# Patient Record
Sex: Female | Born: 1970 | Smoking: Former smoker
Health system: Southern US, Community
[De-identification: ages and names within clinical notes are randomized; demographics above are authoritative.]

## PROBLEM LIST (undated history)

## (undated) DIAGNOSIS — E039 Hypothyroidism, unspecified: Secondary | ICD-10-CM

## (undated) DIAGNOSIS — R5381 Other malaise: Secondary | ICD-10-CM

## (undated) DIAGNOSIS — E785 Hyperlipidemia, unspecified: Secondary | ICD-10-CM

## (undated) DIAGNOSIS — R5383 Other fatigue: Secondary | ICD-10-CM

## (undated) DIAGNOSIS — F3189 Other bipolar disorder: Secondary | ICD-10-CM

## (undated) HISTORY — DX: Hyperlipidemia, unspecified: E78.5

## (undated) HISTORY — DX: Other malaise: R53.83

## (undated) HISTORY — DX: Other bipolar disorder: F31.89

## (undated) HISTORY — DX: Other malaise: R53.81

## (undated) HISTORY — DX: Other fatigue: R53.83

## (undated) HISTORY — PX: OTHER SURGICAL HISTORY: SHX169

## (undated) HISTORY — DX: Hypothyroidism, unspecified: E03.9

---

## 2006-07-28 ENCOUNTER — Emergency Department (HOSPITAL_COMMUNITY): Admission: EM | Admit: 2006-07-28 | Discharge: 2006-07-28 | Payer: Self-pay | Admitting: Emergency Medicine

## 2006-12-26 ENCOUNTER — Inpatient Hospital Stay (HOSPITAL_COMMUNITY): Admission: AD | Admit: 2006-12-26 | Discharge: 2007-01-14 | Payer: Self-pay | Admitting: Psychiatry

## 2006-12-26 ENCOUNTER — Emergency Department (HOSPITAL_COMMUNITY): Admission: EM | Admit: 2006-12-26 | Discharge: 2006-12-26 | Payer: Self-pay | Admitting: Emergency Medicine

## 2006-12-26 ENCOUNTER — Ambulatory Visit: Payer: Self-pay | Admitting: Psychiatry

## 2007-01-09 ENCOUNTER — Telehealth: Payer: Self-pay | Admitting: Family Medicine

## 2007-01-16 ENCOUNTER — Inpatient Hospital Stay (HOSPITAL_COMMUNITY): Admission: RE | Admit: 2007-01-16 | Discharge: 2007-01-24 | Payer: Self-pay | Admitting: Psychiatry

## 2007-01-16 ENCOUNTER — Ambulatory Visit: Payer: Self-pay | Admitting: Psychiatry

## 2007-01-25 ENCOUNTER — Other Ambulatory Visit (HOSPITAL_COMMUNITY): Admission: RE | Admit: 2007-01-25 | Discharge: 2007-04-25 | Payer: Self-pay | Admitting: Psychiatry

## 2007-01-25 ENCOUNTER — Ambulatory Visit: Payer: Self-pay | Admitting: Family Medicine

## 2007-01-25 DIAGNOSIS — E039 Hypothyroidism, unspecified: Secondary | ICD-10-CM | POA: Insufficient documentation

## 2007-01-25 DIAGNOSIS — R5383 Other fatigue: Secondary | ICD-10-CM

## 2007-01-25 DIAGNOSIS — R5381 Other malaise: Secondary | ICD-10-CM | POA: Insufficient documentation

## 2007-01-25 DIAGNOSIS — F312 Bipolar disorder, current episode manic severe with psychotic features: Secondary | ICD-10-CM | POA: Insufficient documentation

## 2007-01-31 ENCOUNTER — Ambulatory Visit: Payer: Self-pay | Admitting: Family Medicine

## 2007-02-01 LAB — CONVERTED CEMR LAB
ALT: 9 units/L (ref 0–35)
AST: 13 units/L (ref 0–37)
Albumin: 2.9 g/dL — ABNORMAL LOW (ref 3.5–5.2)
Alkaline Phosphatase: 44 units/L (ref 39–117)
BUN: 13 mg/dL (ref 6–23)
Basophils Absolute: 0 10*3/uL (ref 0.0–0.1)
Basophils Relative: 0.5 % (ref 0.0–1.0)
Bilirubin, Direct: 0.1 mg/dL (ref 0.0–0.3)
CO2: 31 meq/L (ref 19–32)
Calcium: 9.2 mg/dL (ref 8.4–10.5)
Chloride: 104 meq/L (ref 96–112)
Cholesterol: 186 mg/dL (ref 0–200)
Creatinine, Ser: 0.8 mg/dL (ref 0.4–1.2)
Eosinophils Absolute: 0.2 10*3/uL (ref 0.0–0.6)
Eosinophils Relative: 2 % (ref 0.0–5.0)
Free T4: 0.5 ng/dL — ABNORMAL LOW (ref 0.6–1.6)
GFR calc Af Amer: 106 mL/min
GFR calc non Af Amer: 87 mL/min
Glucose, Bld: 86 mg/dL (ref 70–99)
HCT: 35.2 % — ABNORMAL LOW (ref 36.0–46.0)
HDL: 48.8 mg/dL (ref >39.0)
Hemoglobin: 11.9 g/dL — ABNORMAL LOW (ref 12.0–15.0)
LDL Cholesterol: 119 mg/dL — ABNORMAL HIGH (ref 0–99)
Lymphocytes Relative: 24 % (ref 12.0–46.0)
MCHC: 33.9 g/dL (ref 30.0–36.0)
MCV: 89.7 fL (ref 78.0–100.0)
Monocytes Absolute: 0.9 10*3/uL — ABNORMAL HIGH (ref 0.2–0.7)
Monocytes Relative: 10.6 % (ref 3.0–11.0)
Neutro Abs: 5.3 10*3/uL (ref 1.4–7.7)
Neutrophils Relative %: 62.9 % (ref 43.0–77.0)
Platelets: 207 10*3/uL (ref 150–400)
Potassium: 4.9 meq/L (ref 3.5–5.1)
RBC: 3.92 M/uL (ref 3.87–5.11)
RDW: 12.4 % (ref 11.5–14.6)
Sodium: 140 meq/L (ref 135–145)
T3, Free: 2.3 pg/mL (ref 2.3–4.2)
TSH: 16.6 microintl units/mL — ABNORMAL HIGH (ref 0.35–5.50)
Total Bilirubin: 0.6 mg/dL (ref 0.3–1.2)
Total CHOL/HDL Ratio: 3.8
Total Protein: 5.6 g/dL — ABNORMAL LOW (ref 6.0–8.3)
Triglycerides: 89 mg/dL (ref 0–149)
VLDL: 18 mg/dL (ref 0–40)
WBC: 8.4 10*3/uL (ref 4.5–10.5)

## 2007-02-22 ENCOUNTER — Ambulatory Visit: Payer: Self-pay | Admitting: Psychiatry

## 2007-03-17 ENCOUNTER — Encounter (INDEPENDENT_AMBULATORY_CARE_PROVIDER_SITE_OTHER): Payer: Self-pay | Admitting: *Deleted

## 2007-03-20 ENCOUNTER — Ambulatory Visit: Payer: Self-pay | Admitting: Family Medicine

## 2007-03-21 LAB — CONVERTED CEMR LAB: TSH: 3.99 microintl units/mL (ref 0.35–5.50)

## 2008-02-08 ENCOUNTER — Other Ambulatory Visit: Admission: RE | Admit: 2008-02-08 | Discharge: 2008-02-08 | Payer: Self-pay | Admitting: Family Medicine

## 2008-02-08 ENCOUNTER — Encounter: Payer: Self-pay | Admitting: Family Medicine

## 2008-02-08 ENCOUNTER — Ambulatory Visit: Payer: Self-pay | Admitting: Family Medicine

## 2008-02-12 LAB — CONVERTED CEMR LAB
Pap Smear: NORMAL
TSH: 8.3 microintl units/mL — ABNORMAL HIGH (ref 0.35–5.50)

## 2008-02-14 ENCOUNTER — Encounter (INDEPENDENT_AMBULATORY_CARE_PROVIDER_SITE_OTHER): Payer: Self-pay | Admitting: *Deleted

## 2008-03-05 ENCOUNTER — Encounter: Payer: Self-pay | Admitting: Family Medicine

## 2009-05-17 ENCOUNTER — Emergency Department (HOSPITAL_COMMUNITY): Admission: EM | Admit: 2009-05-17 | Discharge: 2009-05-20 | Payer: Self-pay | Admitting: Emergency Medicine

## 2009-05-20 ENCOUNTER — Inpatient Hospital Stay (HOSPITAL_COMMUNITY): Admission: AD | Admit: 2009-05-20 | Discharge: 2009-06-09 | Payer: Self-pay | Admitting: Psychiatry

## 2009-05-20 ENCOUNTER — Ambulatory Visit: Payer: Self-pay | Admitting: Psychiatry

## 2009-06-27 ENCOUNTER — Ambulatory Visit: Payer: Self-pay | Admitting: Family Medicine

## 2009-07-01 LAB — CONVERTED CEMR LAB: TSH: 2.725 microintl units/mL (ref 0.350–4.500)

## 2010-01-28 ENCOUNTER — Encounter (INDEPENDENT_AMBULATORY_CARE_PROVIDER_SITE_OTHER): Payer: Self-pay | Admitting: *Deleted

## 2010-02-03 ENCOUNTER — Ambulatory Visit: Payer: Self-pay | Admitting: Family Medicine

## 2010-02-03 ENCOUNTER — Other Ambulatory Visit: Admission: RE | Admit: 2010-02-03 | Discharge: 2010-02-03 | Payer: Self-pay | Admitting: Family Medicine

## 2010-02-09 ENCOUNTER — Encounter (INDEPENDENT_AMBULATORY_CARE_PROVIDER_SITE_OTHER): Payer: Self-pay | Admitting: *Deleted

## 2010-03-02 ENCOUNTER — Ambulatory Visit: Payer: Self-pay | Admitting: Family Medicine

## 2010-03-04 LAB — CONVERTED CEMR LAB
ALT: 13 units/L (ref 0–35)
AST: 14 units/L (ref 0–37)
Alkaline Phosphatase: 50 units/L (ref 39–117)
Bilirubin, Direct: 0.1 mg/dL (ref 0.0–0.3)
CO2: 24 meq/L (ref 19–32)
Chloride: 102 meq/L (ref 96–112)
Potassium: 4.2 meq/L (ref 3.5–5.1)
Sodium: 139 meq/L (ref 135–145)
Total Bilirubin: 0.8 mg/dL (ref 0.3–1.2)
Total CHOL/HDL Ratio: 4
Total Protein: 6.9 g/dL (ref 6.0–8.3)
VLDL: 11.8 mg/dL (ref 0.0–40.0)

## 2010-05-06 ENCOUNTER — Ambulatory Visit
Admission: RE | Admit: 2010-05-06 | Discharge: 2010-05-06 | Payer: Self-pay | Source: Home / Self Care | Attending: Family Medicine | Admitting: Family Medicine

## 2010-06-02 NOTE — Letter (Signed)
Summary: [place] No Show Letter  [place] at [facility]  [address] [place], [place] [address]   Phone: [phone]  Fax: [phone]    01/28/2010 MRN: [id number]  [name] [address] [place], [place]  [address]   Dear [name],   Our records indicate that you missed your scheduled appointment with _____lab________________ on __9.27.11__________.  Please contact this office to reschedule your appointment as soon as possible.  It is important that you keep your scheduled appointments with your physician, so we can provide you the best care possible.  Please be advised that there may be a charge for "no show" appointments.    Sincerely,   [place] at [facility]

## 2010-06-02 NOTE — Letter (Signed)
Summary: Results Follow up Letter  [place] at [facility]  [address] [place], [place] [address]   Phone: [phone]  Fax: [phone]    02/09/2010 MRN: [id number]     [name] [address] [place], [place]  [address]    Dear [name],  The following are the results of your recent test(s):  Test         Result    Pap Smear:        Normal __x___  Not Normal _____ Comments:Repeat in 1 year ______________________________________________________ Cholesterol: LDL(Bad cholesterol):         Your goal is less than:         HDL (Good cholesterol):       Your goal is more than: Comments:  ______________________________________________________ Mammogram:        Normal _____  Not Normal _____ Comments:  ___________________________________________________________________ Hemoccult:        Normal _____  Not normal _______ Comments:    _____________________________________________________________________ Other Tests:    We routinely do not discuss normal results over the telephone.  If you desire a copy of the results, or you have any questions about this information we can discuss them at your next office visit.   Sincerely,  Kerby Nora MD
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
## 2010-06-04 NOTE — Assessment & Plan Note (Signed)
Summary: DEPO SHOT/CLE  Nurse Visit   Allergies: No Known Drug Allergies  Medication Administration  Injection # 1:    Medication: Depo-Provera 150mg     Diagnosis: CONTRACEPTIVE MANAGEMENT (ICD-V25.09)    Route: IM    Site: RUOQ gluteus    Exp Date: 08/31/2012    Lot #: [id number]    Mfr: GREENSTONE    Patient tolerated injection without complications    Given by: Mervin Hack CMA (AAMA) (May 06, 2010 4:18 PM)  Orders Added: 1)  Depo-Provera 150mg  [J1055] 2)  Admin of Therapeutic Inj  intramuscular or subcutaneous [96372]  Prior Medications: MULTIVITAMINS   TABS (MULTIPLE VITAMIN) Take 1 tablet by mouth once a day DEPAKOTE 125 MG TBEC (DIVALPROEX SODIUM) takes once daily Current Allergies: No known allergies    Medication Administration  Injection # 1:    Medication: Depo-Provera 150mg     Diagnosis: CONTRACEPTIVE MANAGEMENT (ICD-V25.09)    Route: IM    Site: RUOQ gluteus    Exp Date: 08/31/2012    Lot #: [id number]    Mfr: GREENSTONE    Patient tolerated injection without complications    Given by: Mervin Hack CMA (AAMA) (May 06, 2010 4:18 PM)  Orders Added: 1)  Depo-Provera 150mg  [J1055] 2)  Admin of Therapeutic Inj  intramuscular or subcutaneous [78295]

## 2010-07-19 LAB — DIFFERENTIAL
Basophils Relative: 0 % (ref 0–1)
Eosinophils Absolute: 0 10*3/uL (ref 0.0–0.7)
Lymphs Abs: 1.7 10*3/uL (ref 0.7–4.0)
Neutro Abs: 6 10*3/uL (ref 1.7–7.7)
Neutrophils Relative %: 74 % (ref 43–77)

## 2010-07-19 LAB — GLUCOSE, CAPILLARY: Glucose-Capillary: 117 mg/dL — ABNORMAL HIGH (ref 70–99)

## 2010-07-19 LAB — BASIC METABOLIC PANEL
BUN: 14 mg/dL (ref 6–23)
Calcium: 9.5 mg/dL (ref 8.4–10.5)
Chloride: 106 mEq/L (ref 96–112)
Creatinine, Ser: 0.83 mg/dL (ref 0.4–1.2)

## 2010-07-19 LAB — RAPID URINE DRUG SCREEN, HOSP PERFORMED
Amphetamines: NOT DETECTED
Benzodiazepines: NOT DETECTED
Cocaine: NOT DETECTED
Tetrahydrocannabinol: NOT DETECTED

## 2010-07-19 LAB — CBC
MCHC: 33.7 g/dL (ref 30.0–36.0)
MCV: 88.3 fL (ref 78.0–100.0)
Platelets: 326 10*3/uL (ref 150–400)
WBC: 8.1 10*3/uL (ref 4.0–10.5)

## 2010-07-19 LAB — ETHANOL: Alcohol, Ethyl (B): <5 mg/dL (ref 0–10)

## 2010-07-19 LAB — POCT PREGNANCY, URINE: Preg Test, Ur: NEGATIVE

## 2010-07-20 LAB — HEPATIC FUNCTION PANEL
ALT: 13 U/L (ref 0–35)
AST: 15 U/L (ref 0–37)
Bilirubin, Direct: 0.2 mg/dL (ref 0.0–0.3)
Total Bilirubin: 1 mg/dL (ref 0.3–1.2)

## 2010-08-05 ENCOUNTER — Ambulatory Visit (INDEPENDENT_AMBULATORY_CARE_PROVIDER_SITE_OTHER): Payer: BC Managed Care – PPO | Admitting: Family Medicine

## 2010-08-05 DIAGNOSIS — Z3009 Encounter for other general counseling and advice on contraception: Secondary | ICD-10-CM

## 2010-08-05 MED ORDER — MEDROXYPROGESTERONE ACETATE 150 MG/ML IM SUSP
150.0000 mg | Freq: Once | INTRAMUSCULAR | Status: AC
  Administered 2010-08-05: 150 mg via INTRAMUSCULAR

## 2010-08-05 NOTE — Progress Notes (Signed)
  Subjective:    Patient ID: [name], female    DOB: 22-Mar-1971, 40 y.o.   MRN: [id number]  HPI Agree with depo.   Review of Systems     Objective:   Physical Exam        Assessment & Plan:

## 2010-09-15 NOTE — H&P (Signed)
[name], [name]              ACCOUNT NO.:  [id number]   MEDICAL RECORD NO.:  [id number]          PATIENT TYPE:  IPS   LOCATION:  0304                          FACILITY:  [facility]   PHYSICIAN:  Anselm Jungling, MD  DATE OF BIRTH:  1970-06-13   DATE OF ADMISSION:  12/26/2006  DATE OF DISCHARGE:                       PSYCHIATRIC ADMISSION ASSESSMENT   TIME:  0800 hours.   IDENTIFYING INFORMATION:  This a 40 year old married white female.  This  is a voluntary admission.   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  She was  given 10 mg Geodon IM and 1 mg of Ativan p.o. on arrival here.  She  continued with pressured speech, flight of ideas, some increased motor  movement, feeling the need to pace and walk.  Mood is irritable and  grandiose, and thoughts are hyper-religious.  She is stating that she is  God, no one should come near her or attempt to direct her.  She is  preoccupied with dividing people up into who are the believers versus  the nonbelievers, who is blessed versus who is non-blessed.  Her  cognition is preserved.  She is aware of person, place and situation,  but is a poor historian due to her flight of ideas.  Primarily, her  history is taken from the record.  Her husband had said she has had  episodes like this previous in the past prior to the start of the school  year.  No overt homicidal or suicidal thoughts expressed.  She does  appear to be internally-stimulated and distracted.   PAST PSYCHIATRIC HISTORY:  This is her first admission to [name].   PAST PSYCHIATRIC HISTORY:  She has a prior diagnosis of bipolar disorder  diagnosed in [place], where  she has been previously hospitalized.  She reports that she had taken Depakote in the past for her bipolar  disorder, but did not continue to take it.  She is unable to give much  more in terms of medication history.  She reports she is not currently  taking any medications and has no current psychiatrist.  Denies any  history of substance abuse.  Denies prior suicide attempts.   SOCIAL HISTORY:  A 2nd grade teacher, [name]  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   FAMILY HISTORY:  Not available.   ALCOHOL AND DRUG HISTORY:  She denies any substance abuse.   MEDICAL HISTORY:  The patient is followed by [name].  Physician assignment is unavailable.   MEDICAL PROBLEMS:  The patient has reported a history of thyroid  disorder in the past and bipolar disorder.   CURRENT MEDICATIONS:  None.   PAST MEDICAL HISTORY:  Remarkable for an having episode of nausea and  vomiting in March for which she was seen in our emergency room.  No  other information available.   She is  ALLERGIC TO PENICILLIN.   MEDICATIONS RECEIVED IN THE EMERGENCY ROOM:  Geodon 10 mg IM and Ativan  1 mg p.o.  Her local pharmacy is [facility] in [place], [place] where  they report she is on no regular medications.   POSITIVE PHYSICAL FINDINGS:  Full physical exam was done in the  emergency room, generally unremarkable.  Petite, medium-built female in  no distress.  She does have a noticeable slight goiter.  On  presentation, temp 99.1, pulse 108, respirations 21, blood pressure  122/70, pulse ox 98%.  Her CT scan of her brain was unremarkable.  Urine  drug screen negative for all substances.  TSH 1.370.  Urine pregnancy  test negative.  Chemistry:  sodium 137, potassium 3.7, chloride 112,  carbon dioxide 16, BUN 16, creatinine 0.6, and random glucose was 108.   MENTAL STATUS EXAM:  A petite female, agitated with pressured speech,  wandering in the hall, occasionally screaming, singing religious hymns  from time-to-time.  Speech is pressured.  She is  directable with effort.  No eye contact.  Affect is labile, tearful at times, elevated at others.  Speech is variable in tone, minimal production.  Mood is labile.  Thought process remarkable for flight of ideas.  She does appear  internally distracted.  A lot of hyper-religious thought and theme.  Cognition, she is able to express that she is fully aware of where she  is at and why, and hospital she has been in in the past.  Judgment is  impaired. Concentration is impaired.  Insight is impaired.  Impulse  control impaired.   AXIS I:  Bipolar disorder type 1, mixed state.  AXIS II:  Deferred.  AXIS III:  No diagnosis.  AXIS IV:  Not known.  AXIS V:  Current 20, past year not known.   PLAN:  Voluntarily admit the patient with q. 15-minute checks in place  to stabilize her and alleviate her mood lability and flight of ideas,  improve her reality testing.  We are going to resume her Depakote with  250 mg q.a.m. and 500 at h.s.  We are going to actually file an  involuntary petition on her at this time.  She has been refusing to take  medications, is somewhat disruptive.  Zyprexa 10 mg p.o. Zydis.  She  received it 4 a.m. this morning, and has 5 mg available q.6 hour p.r.n.  and Ativan 1 mg q.4 hours p.r.n.  We are going to contact her husband  for additional information, and we will go from there.  Estimated length  of stay is 7 days.      Margaret A. Lorin Picket, N.P.      Anselm Jungling, MD  Electronically Signed    MAS/MEDQ  D:  12/27/2006  T:  12/27/2006  Job:  [id number]

## 2010-09-15 NOTE — Discharge Summary (Signed)
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  Please refer to the admission note for further details  pertaining to the symptoms, circumstances and history that led to her  hospitalization.   INITIAL DIAGNOSTIC IMPRESSION:  She was given an initial AXIS I  diagnosis of bipolar disorder, type 1, manic with psychotic features.   MEDICAL/LABORATORY:  The patient was medically and physically assessed  by the psychiatric nurse practitioner.  She was in essentially good  health, although we obtained records from an inpatient hospitalization  of 1998 that occurred in [place].  In that psychiatric  hospitalization, there was some concern about thyroid abnormality.  The  patient's thyroid indices were checked here during this hospital stay,  and were within normal limits.  There were no significant medical issues  apart from her psychiatric ones.   HOSPITAL COURSE:  The patient was admitted to the adult inpatient  psychiatric service.  She presented as a well-nourished, well-developed  woman who appeared to be in good health.  Initially, she was extremely  agitated, hyperactive, yelling, singing, crying, and at times  inconsolable.  She refused multiple offers of oral medication.  She  stated that she believed medication that was being offered was poison.  There was a lot of hyperreligious ideation within her delusional  thinking system.   She was initially  treated with a combination of Geodon and Depakote.  The patient consistently refused doses of medications during the early  part of her stay, although at times, when her husband was present, he  was able to persuade her to take oral medication.  After a period of  days, in which the patient appeared to be making virtually no clinical  progress due to her repeated refusal of medication doses, it was  determined that it was in her best interest to give involuntary  medication in the form of injectable Geodon doses.  This was discussed  with her husband in detail, and he was in agreement with this approach.  A second opinion was obtained by one of our other staff psychiatrists,  who was in agreement that the patient met criteria for nonconsenting  medication administration.  The patient did receive some doses of her  Geodon intramuscularly, but following this, was more frequently willing  to take oral doses of Geodon and Depakote.   Towards the end of the first week of her inpatient stay, the undersigned  and the casemanager met with the patient's husband for a 45-minute long  conference to discuss her diagnosis and treatment approaches.  We  characterized to her husband that this appeared to be a clear episode of  manic psychosis, part of a bipolar disorder that appeared to be a  longstanding part of her history.  Apparently, in the time that the  patient had been married to this husband, she had not been significantly  ill.  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  We  emphasized to her husband the importance of stabilizing her condition  with medication, and that owing to her repeated refusal of oral  medication, that we would need to apply nonconsenting injectable  medication if oral doses were refused.  We reassured him that we would  only to take this step if the patient was refusing medication  after  significant efforts to persuade her to take the oral medication.  Husband indicated that he understood this approach.   The patient's husband indicated that there was some concern that her  current condition could have been due to either thyroid abnormality,  some form of adrenal insufficiency, or simply lack of sleep.  It was  explained to the patient's husband that lack of sleep, or decreased need  for sleep is a hallmark symptom of a manic episode, and that although  insomnia may have preceded the more florid expression of her illness,  that it alone was not the cause of it, but rather the first form of  symptoms to become overtly noticeable.  We indicated to her husband that  her thyroid indices as we tested them were normal, and that she did not  appear to have any physical or physiological stigmata of thyroid  disturbance.  Based on his concern about adrenal insufficiency, which  did not appear to be based on anything other than some exploration that  he had done on the internet, we went ahead and ordered a serum cortisol  level, which was actually at about the 80th percentile of the normal  range for serum cortisol.   We reached a point during the middle part of her stay where it appeared  that she was not having a good response to the combination of Geodon and  Depakote, even after she began to have these medications administered on  a more regular basis.  As such, it was felt necessary to switch to a  trial of Risperdal, and to add lithium to her regimen.  Within a few  days of adding lithium and substituting Risperdal for Geodon, the  patient began to show some significant improvement, with development of  more insight into her condition, less in the way of racing thoughts and  loose associations, and more ability to participate in some of the  activities of the therapeutic milieu.  Her husband agreed that she was  showing signs of improvement.   For the remainder of  her stay, she was continued on a combination of  Risperdal, the dosage of which was gradually adjusted upward to a final  level of 4 mg q.h.s., lithium, and Depakote.   Relatively early on in the patient's stay, before we felt that she was  showing much in the way of significant improvement, her husband  expressed an interest and a desire to take the patient home, and have  himself and his mother care for the patient there and administer her  medications.  We indicated to husband that owing to his availability and  his mother's availability, virtually  24 hours a day, that we would  consider releasing her a bit earlier than we might have otherwise.   Towards the end of her stay, Beatriz was beginning to show a fair degree  of insight into her illness, and the need for medication.  However, she  also verbalized a hope that she would be able to find a homeopathic  physician that would wean her off these medications and find something  more natural to treat her disorder.  Both she and her husband were  cautioned against this sort of approach.   On the 20th hospital day, the patient appeared to be improved  sufficiently, and cooperative enough with medication that it was  reasonable to send her home.  Prior to her departure, it was emphasized  to the patient and her husband very carefully that she needed to remain  on her medications faithfully, and that if medication were to be  abandoned or not taken in the amounts prescribed, that there was an  extremely high likelihood of relapse.   We indicated our opinion that the patient will probably not be ready to  return to work for a period of 2-4 weeks minimum.  The patient and her  husband agreed to the following discharge and aftercare plan.   AFTERCARE:  The patient was to follow up with Dr. Nolen Mu,  psychiatrist, on February 02, 2007, along with Cathey Endow in the same  office.  It was hoped that the patient would be able to see  these  providers sooner, should cancellation opportunity occur.   DISCHARGE MEDICATIONS:  1. Risperdal M-Tab 4 mg q.h.s.  2. Lithium carbonate 600 mg b.i.d.  3. Depakote ER 1000 mg daily.   Lithium level prior to discharge, two days, was 1.16.  At that point,  the dose had been 900 mg b.i.d.  Because the lithium level was at the  upper end of the therapeutic range, the lithium dosage prior to  discharge was lowered to 600 mg b.i.d.  The final Depakote level was  106, which was felt to be adequate.   DISCHARGE DIAGNOSES:  AXIS I:  Bipolar disorder, type 1, manic, with  psychosis, resolving.  AXIS II:  Deferred.  AXIS III:  No acute or chronic illnesses.  AXIS IV:  Stressors:  Severe.  AXIS V:  GAF on discharge 55.      Anselm Jungling, MD  Electronically Signed     SPB/MEDQ  D:  01/16/2007  T:  01/16/2007  Job:  [id number]

## 2010-09-15 NOTE — H&P (Signed)
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  She was readmitted 3 days  after her discharge following 2-week stay for treatment of bipolar  disorder, manic with psychosis.  Please refer to the previous admission  and discharge summaries, and the readmission note for the second  hospitalization.   MEDICAL AND LABORATORY:  The patient was medically and physically  assessed once again by the psychiatric nurse practitioner.  Again,  lithium and Depakote levels were checked.  Her initial lithium level  remained in the therapeutic range but her Depakote level had dropped  from approximately 115 down to 38 in the 3-day interval that she had  been home, suggesting that Depakote had not been taken during that  period.  Thyroid studies were also reordered.  During this second  hospital stay, her T4 level remained in the low normal end of the  reference range, but her TSH was markedly elevated from her previous  stay at 11.85.  Also, it was noted during this stay that the patient  appeared to be developing a visible goiter.   It turned out that during the interval while the patient was at home,  she saw Dr. Perrin Maltese who had also ordered some thyroid studies.  The  patient had a history of thyroid disorder in the past, as long as 10  years ago, but had apparently been euthymic in recent years.  On the day  of discharge, the nurse practitioner contacted Dr. Perrin Maltese for discussion  regarding followup care  for the patient's thyroid condition, the  etiology of which was not clarified by the time of this dictation.  There were no acute medical issues.   HOSPITAL COURSE:  The patient was readmitted to the adult inpatient  psychiatric service.  She presented as a well-nourished, well-developed  woman who was alert and fully oriented.  Her mood was moderately  euphoric and her thinking was expansive and delusional, stating that her  husband and mother-in-law had tried to kill her.  She was dwelling on  evil and being a princess.  At this point, her presentation was at  least as severe as it had been at the beginning of her first stay, 2-3  weeks prior.   The patient had been discharged on a regimen of Depakote, lithium  carbonate, and Risperdal.  Upon readmission, it was found that her  Depakote levels had dropped by a factor of approximately two thirds  during the interval she was gone.  She and her husband insisted that she  had been taking all of her medications faithfully, but later in her  stay, the patient indicated to  staff that her husband and mother-in-law  had not been supportive of taking medications regularly while she had  been at home.   Her Risperdal dose was increased to 5 mg q.h.s. and Depakote and lithium  was continued at previous doses.   The case manager and the undersigned conferred with the patient's  husband on the third hospital day.  We reviewed factors that may have  been contributing to her relapse including the possibility that her  Depakote had not been absorbed for whatever reason.  Again, in this  meeting, her husband indicated that he believed that she was taking all  of her medications faithfully and that he observed her swallow them.   The patient's Risperdal dose was subsequently increased to 6 mg q.h.s.  With this, her symptoms of mania and euphoria and delusionality  diminished steadily over the next 5 days.   By the eighth hospital day, the patient  appeared to be moderately  sedated by her medications but her thinking was completely realistic,  logical, and she showed good insight into the nature of her underlying  bipolar disorder and appeared to be sincerely convinced of the need to  continue all of her medications.   The patient was open to a plan that involved her returning home but  continuing in our intensive outpatient psychiatric program, beginning  the day following her discharge.   AFTERCARE:  The patient was discharged on 09/23, with a plan to return  the following morning to the intensive outpatient psychiatric program at  8:30 a.m.   DISCHARGE MEDICATIONS:  Lithium carbonate 600 mg b.i.d., Depakote ER  1500 mg q.h.s., Risperdal 4 mg q.h.s.   The patient was to follow up with Dr. Perrin Maltese for further evaluation and  guidance regarding her thyroid status and any necessary further medical  treatment for it.   DISCHARGE DIAGNOSES:  AXIS I: Bipolar disorder, most recently manic with  psychotic features, resolving.  AXIS II:    Deferred.  AXIS III:   Rule out thyroid disorder.  AXIS IV:    Stressors severe.  AXIS V:     GAF on discharge 50.      Anselm Jungling, MD  Electronically Signed     SPB/MEDQ  D:  01/25/2007  T:  01/25/2007  Job:  [id number]

## 2010-10-21 ENCOUNTER — Ambulatory Visit: Payer: BC Managed Care – PPO

## 2010-10-22 ENCOUNTER — Ambulatory Visit (INDEPENDENT_AMBULATORY_CARE_PROVIDER_SITE_OTHER): Payer: BC Managed Care – PPO | Admitting: Family Medicine

## 2010-10-22 DIAGNOSIS — Z309 Encounter for contraceptive management, unspecified: Secondary | ICD-10-CM

## 2010-10-22 MED ORDER — MEDROXYPROGESTERONE ACETATE 150 MG/ML IM SUSP
150.0000 mg | Freq: Once | INTRAMUSCULAR | Status: AC
  Administered 2010-10-22: 150 mg via INTRAMUSCULAR

## 2010-10-26 NOTE — Progress Notes (Signed)
  Subjective:    Patient ID: [name], female    DOB: 1971/02/09, 40 y.o.   MRN: [id number]  HPI Depo inj   Review of Systems     Objective:   Physical Exam        Assessment & Plan:

## 2010-12-30 ENCOUNTER — Encounter: Payer: Self-pay | Admitting: Family Medicine

## 2010-12-30 ENCOUNTER — Ambulatory Visit (INDEPENDENT_AMBULATORY_CARE_PROVIDER_SITE_OTHER): Payer: 59 | Admitting: Family Medicine

## 2010-12-30 VITALS — BP 90/60 | HR 86 | Temp 98.3°F | Ht 60.0 in | Wt 156.8 lb

## 2010-12-30 DIAGNOSIS — H10029 Other mucopurulent conjunctivitis, unspecified eye: Secondary | ICD-10-CM

## 2010-12-30 MED ORDER — POLYMYXIN B-TRIMETHOPRIM 10000-0.1 UNIT/ML-% OP SOLN: 1.0000 [drp] | OPHTHALMIC | Status: AC

## 2010-12-30 NOTE — Progress Notes (Signed)
  Subjective:    Patient ID: [name], female    DOB: May 13, 1970, 40 y.o.   MRN: [id number]  HPI  [name], a 40 y.o. female presents today in the office for the following:    Woke up this morning and having a hard time opening eyes. Could not open her eyes at all. Eyes are a little itchy.   More itchy. Did have some mucous in her eyes. Now with pain in eyes.   Has also had a cold for a few days.   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   SUBJECTIVE:  40 y.o. female with burning, redness, discharge and mattering in both eyes for 1 days.  No other symptoms.  No significant prior ophthalmological history. No change in visual acuity, no photophobia, no severe eye pain. No hx eye allergies. No trauma to eye  OBJECTIVE:  Patient appears well, vitals signs are normal. Eyes: both eyes with findings of typical conjunctivitis noted; erythema and scant discharge. PERRLA, no foreign body noted. No periorbital cellulitis. The corneas are clear and fundi normal. Visual acuity normal.   ASSESSMENT:  Conjunctivitis - probably viral, less likely bact  PLAN:  Antibiotic drops per order. Hygiene discussed. Oow until Monday  The PMH, PSH, Social History, Family History, Medications, and allergies have been reviewed in [facility], and have been updated if relevant.  Review of Systems     Objective:   Physical Exam        Assessment & Plan:

## 2011-01-13 ENCOUNTER — Ambulatory Visit (INDEPENDENT_AMBULATORY_CARE_PROVIDER_SITE_OTHER): Payer: 59 | Admitting: *Deleted

## 2011-01-13 DIAGNOSIS — Z309 Encounter for contraceptive management, unspecified: Secondary | ICD-10-CM

## 2011-01-13 MED ORDER — MEDROXYPROGESTERONE ACETATE 150 MG/ML IM SUSP
150.0000 mg | Freq: Once | INTRAMUSCULAR | Status: AC
  Administered 2011-01-13: 150 mg via INTRAMUSCULAR

## 2011-01-13 NOTE — Progress Notes (Signed)
  Subjective:    Patient ID: [name], female    DOB: 10/08/1970, 40 y.o.   MRN: [id number]  HPI    Review of Systems     Objective:   Physical Exam        Assessment & Plan:  Patient given depo at nurse visit today and asked to return  Between November 28-Dec 12th for next injection

## 2011-02-11 LAB — LITHIUM LEVEL: Lithium Lvl: 1.22

## 2011-02-12 LAB — I-STAT 8, (EC8 V) (CONVERTED LAB)
Acid-base deficit: 8 — ABNORMAL HIGH
BUN: 16
Chloride: 112
HCT: 45
Hemoglobin: 15.3 — ABNORMAL HIGH
Operator id: 151321
Potassium: 3.7
Sodium: 137
pCO2, Ven: 24.4 — ABNORMAL LOW

## 2011-02-12 LAB — CBC
HCT: 44.7
HCT: 41
Hemoglobin: 15.3 — ABNORMAL HIGH
Hemoglobin: 14.3
Hemoglobin: 15.1 — ABNORMAL HIGH
MCHC: 34.2
MCHC: 34.9
MCV: 87.6
MCV: 85.7
RBC: 5.1
RBC: 4.78
RBC: 5.05
WBC: 8.3
WBC: 6.5
WBC: 8

## 2011-02-12 LAB — COMPREHENSIVE METABOLIC PANEL
AST: 16
Albumin: 4.1
BUN: 11
BUN: 12
CO2: 26
CO2: 23
Calcium: 8.9
Chloride: 101
Chloride: 102
Chloride: 110
Creatinine, Ser: 0.77
Creatinine, Ser: 0.84
Creatinine, Ser: 0.64
GFR calc non Af Amer: >60
GFR calc non Af Amer: >60
Glucose, Bld: 109 — ABNORMAL HIGH
Glucose, Bld: 103 — ABNORMAL HIGH
Total Bilirubin: 0.6
Total Bilirubin: 0.7
Total Bilirubin: 1.2

## 2011-02-12 LAB — HEPATIC FUNCTION PANEL
AST: 25
Albumin: 3.9
Bilirubin, Direct: 0.1
Total Bilirubin: 0.8

## 2011-02-12 LAB — VALPROIC ACID LEVEL
Valproic Acid Lvl: 103.7 — ABNORMAL HIGH
Valproic Acid Lvl: 107.2 — ABNORMAL HIGH
Valproic Acid Lvl: 107.7 — ABNORMAL HIGH
Valproic Acid Lvl: 116.2 — ABNORMAL HIGH
Valproic Acid Lvl: 96.2

## 2011-02-12 LAB — ETHANOL: Alcohol, Ethyl (B): <5

## 2011-02-12 LAB — TSH: TSH: 2.27

## 2011-02-12 LAB — RAPID URINE DRUG SCREEN, HOSP PERFORMED: Barbiturates: NOT DETECTED

## 2011-02-12 LAB — T4, FREE: Free T4: 1.3

## 2011-02-12 LAB — T3, FREE: T3, Free: 2.7 (ref 2.3–4.2)

## 2011-02-12 LAB — PREGNANCY, URINE: Preg Test, Ur: NEGATIVE

## 2011-04-13 ENCOUNTER — Ambulatory Visit (INDEPENDENT_AMBULATORY_CARE_PROVIDER_SITE_OTHER): Payer: 59 | Admitting: *Deleted

## 2011-04-13 DIAGNOSIS — Z309 Encounter for contraceptive management, unspecified: Secondary | ICD-10-CM

## 2011-04-13 MED ORDER — MEDROXYPROGESTERONE ACETATE 150 MG/ML IM SUSP
150.0000 mg | Freq: Once | INTRAMUSCULAR | Status: AC
  Administered 2011-04-13: 150 mg via INTRAMUSCULAR

## 2011-07-29 ENCOUNTER — Telehealth: Payer: Self-pay

## 2011-07-29 NOTE — Telephone Encounter (Signed)
Pt said she received notice insurance will not cover BC pill unless Dr Ermalene Searing will write letter that it is medically necessary for pt to be on birth control to prevent another pregnancy. Pt said she was bipolar. Pt can be reached at [phone].

## 2011-08-01 NOTE — Telephone Encounter (Signed)
Please write draft of letter. Stating it is medically necessary for pt to be on birth control given she is on depakote due to bipolar disorder. This medication is contraindicated in pregnancy. Sen letter for me to edit then print and stamp.

## 2011-08-02 ENCOUNTER — Encounter: Payer: Self-pay | Admitting: *Deleted

## 2011-08-02 NOTE — Telephone Encounter (Signed)
Letter sent to dr Ermalene Searing for edit and approval

## 2011-08-03 NOTE — Telephone Encounter (Signed)
Pt called back and Herbert Seta said to let pt know letter was ready. Pt said she would pick up at front desk.

## 2011-08-05 ENCOUNTER — Ambulatory Visit (INDEPENDENT_AMBULATORY_CARE_PROVIDER_SITE_OTHER): Payer: 59 | Admitting: *Deleted

## 2011-08-05 DIAGNOSIS — Z309 Encounter for contraceptive management, unspecified: Secondary | ICD-10-CM

## 2011-08-05 NOTE — Progress Notes (Signed)
Patient came in today for Depo injection, patient was late for injection she was due between 06/29/2011-07/13/2011.  Advised patient that since she missed the due date she has to have two negative pregnancy test two weeks apart before we can give the Depo injection.  Patient should return on 08/19/2011 for next pregnancy test.  Patient verbalized understanding.

## 2011-08-16 ENCOUNTER — Telehealth: Payer: Self-pay

## 2011-08-16 NOTE — Telephone Encounter (Signed)
Pt scheduled nurse visit for 08/19/11 at 10 am for 2nd pregnancy test and Depo injection if preg test negative. Lowella Bandy said she would be glad to do since she did pregancy test 2 weeks ago.

## 2011-08-19 ENCOUNTER — Ambulatory Visit (INDEPENDENT_AMBULATORY_CARE_PROVIDER_SITE_OTHER): Payer: 59 | Admitting: *Deleted

## 2011-08-19 DIAGNOSIS — Z309 Encounter for contraceptive management, unspecified: Secondary | ICD-10-CM

## 2011-08-19 MED ORDER — MEDROXYPROGESTERONE ACETATE 150 MG/ML IM SUSP
150.0000 mg | Freq: Once | INTRAMUSCULAR | Status: AC
  Administered 2011-08-19: 150 mg via INTRAMUSCULAR

## 2012-06-29 ENCOUNTER — Ambulatory Visit (INDEPENDENT_AMBULATORY_CARE_PROVIDER_SITE_OTHER): Payer: 59 | Admitting: Family Medicine

## 2012-06-29 ENCOUNTER — Encounter: Payer: Self-pay | Admitting: Family Medicine

## 2012-06-29 VITALS — BP 110/60 | HR 102 | Temp 98.3°F | Ht 60.0 in | Wt 169.8 lb

## 2012-06-29 DIAGNOSIS — R200 Anesthesia of skin: Secondary | ICD-10-CM | POA: Insufficient documentation

## 2012-06-29 DIAGNOSIS — R209 Unspecified disturbances of skin sensation: Secondary | ICD-10-CM

## 2012-06-29 DIAGNOSIS — E039 Hypothyroidism, unspecified: Secondary | ICD-10-CM

## 2012-06-29 DIAGNOSIS — R202 Paresthesia of skin: Secondary | ICD-10-CM

## 2012-06-29 LAB — TSH: TSH: 1.85 u[IU]/mL (ref 0.35–5.50)

## 2012-06-29 NOTE — Progress Notes (Signed)
  Subjective:    Patient ID: [name], female    DOB: [birth year], 42 y.o.   MRN: [id number]  HPI 42 year old female with history of low thyroid who presents with tingling in B hands x 2 weeks ago. Symptoms come and go.. Points to back on hands, more on pinky side of hnad.  Note it more when hands are resting.  She states that she sleeeps with her hands bent under her at night.  No known compression at elbows.  She uses computer a lot. She has history of carpal tunnel but this feels different.  She has gained 40 lbs in last 6 months. She is concerned about thyroid function.  She has be working on diet. She initially has associated this with depo but she has not taken this since 08/2011.  Cold intolerance. No dry sin, some hair loss.occ peripherla edema.     Review of Systems  Constitutional: Negative for fever and fatigue.  HENT: Negative for ear pain.   Eyes: Negative for pain.  Respiratory: Negative for chest tightness and shortness of breath.   Cardiovascular: Negative for chest pain, palpitations and leg swelling.  Gastrointestinal: Negative for abdominal pain.  Genitourinary: Negative for dysuria.       Objective:   Physical Exam  Constitutional: Vital signs are normal. She appears well-developed and well-nourished. She is cooperative.  Non-toxic appearance. She does not appear ill. No distress.  HENT:  Head: Normocephalic.  Right Ear: Hearing, tympanic membrane, external ear and ear canal normal. Tympanic membrane is not erythematous, not retracted and not bulging.  Left Ear: Hearing, tympanic membrane, external ear and ear canal normal. Tympanic membrane is not erythematous, not retracted and not bulging.  Nose: No mucosal edema or rhinorrhea. Right sinus exhibits no maxillary sinus tenderness and no frontal sinus tenderness. Left sinus exhibits no maxillary sinus tenderness and no frontal sinus tenderness.  Mouth/Throat: Uvula is midline, oropharynx is clear and  moist and mucous membranes are normal.  Eyes: Conjunctivae, EOM and lids are normal. Pupils are equal, round, and reactive to light. No foreign bodies found.  Neck: Trachea normal and normal range of motion. Neck supple. Carotid bruit is not present. No mass and no thyromegaly present.  Cardiovascular: Normal rate, regular rhythm, S1 normal, S2 normal, normal heart sounds, intact distal pulses and normal pulses.  Exam reveals no gallop and no friction rub.   No murmur heard. Pulmonary/Chest: Effort normal and breath sounds normal. Not tachypneic. No respiratory distress. She has no decreased breath sounds. She has no wheezes. She has no rhonchi. She has no rales.  Abdominal: Soft. Normal appearance and bowel sounds are normal. There is no tenderness.  Neurological: She is alert. No cranial nerve deficit or sensory deficit. She displays a negative Romberg sign. Coordination and gait normal.  Neg tinel and phalen, neg ulnar compression  Skin: Skin is warm, dry and intact. No rash noted.  Psychiatric: Her speech is normal and behavior is normal. Judgment and thought content normal. Her mood appears not anxious. Cognition and memory are normal. She does not exhibit a depressed mood.          Assessment & Plan:

## 2012-06-29 NOTE — Assessment & Plan Note (Addendum)
Will re-eval. MAy be causing numbness and weight gain.

## 2012-06-29 NOTE — Assessment & Plan Note (Signed)
Not clearing either Carpal Tunnel or ulnar nerve compression but pt does sleep with wrist bent.  Recommend wearing  CT braces  B at night.  Will check B12 and TSH.

## 2012-06-30 ENCOUNTER — Telehealth: Payer: Self-pay

## 2012-06-30 ENCOUNTER — Encounter: Payer: Self-pay | Admitting: *Deleted

## 2012-06-30 NOTE — Telephone Encounter (Signed)
Pt request recent lab results; pt notified as instructed by result note; advised pt letter mailed today also.pt voiced understanding.

## 2012-07-01 LAB — CBC
HCT: 41.7 % (ref 35.0–47.0)
HGB: 14 g/dL (ref 12.0–16.0)
RDW: 12.5 % (ref 11.5–14.5)
WBC: 13 10*3/uL — ABNORMAL HIGH (ref 3.6–11.0)

## 2012-07-01 LAB — COMPREHENSIVE METABOLIC PANEL
BUN: 18 mg/dL (ref 7–18)
Bilirubin,Total: 0.3 mg/dL (ref 0.2–1.0)
EGFR (African American): >60
EGFR (Non-African Amer.): >60
Osmolality: 286 (ref 275–301)
Potassium: 4.1 mmol/L (ref 3.5–5.1)
Total Protein: 7.4 g/dL (ref 6.4–8.2)

## 2012-07-01 LAB — SALICYLATE LEVEL: Salicylates, Serum: <1.7 mg/dL

## 2012-07-01 LAB — ACETAMINOPHEN LEVEL: Acetaminophen: <2 ug/mL

## 2012-07-02 LAB — URINALYSIS, COMPLETE
Bilirubin,UR: NEGATIVE
Blood: NEGATIVE
Glucose,UR: NEGATIVE mg/dL (ref 0–75)
Nitrite: NEGATIVE
Ph: 6 (ref 4.5–8.0)
Protein: 30
WBC UR: 2 /HPF (ref 0–5)

## 2012-07-02 LAB — DRUG SCREEN, URINE
Barbiturates, Ur Screen: NEGATIVE (ref ?–200)
Benzodiazepine, Ur Scrn: NEGATIVE (ref ?–200)
Opiate, Ur Screen: NEGATIVE (ref ?–300)
Tricyclic, Ur Screen: NEGATIVE (ref ?–1000)

## 2012-07-02 LAB — HCG, QUANTITATIVE, PREGNANCY: Beta Hcg, Quant.: <1 m[IU]/mL — ABNORMAL LOW

## 2012-07-03 ENCOUNTER — Inpatient Hospital Stay: Payer: Self-pay | Admitting: Psychiatry

## 2012-07-07 LAB — CARBAMAZEPINE LEVEL, TOTAL: Carbamazepine: 11.9 ug/mL (ref 4.0–12.0)

## 2012-07-12 LAB — CARBAMAZEPINE LEVEL, TOTAL: Carbamazepine: 8.3 ug/mL (ref 4.0–12.0)

## 2012-07-19 ENCOUNTER — Telehealth: Payer: Self-pay | Admitting: Family Medicine

## 2012-07-19 DIAGNOSIS — E039 Hypothyroidism, unspecified: Secondary | ICD-10-CM

## 2012-07-19 NOTE — Telephone Encounter (Signed)
Pt requesting referral to endocrinologist and to have a thyroid panel drawn.  Says she is taking medication from her psychiatrist that she feels may affect her metabolism.

## 2012-07-20 NOTE — Telephone Encounter (Signed)
Okay to schedule TSH, free T3 and free T4 Dx fatigue.

## 2012-07-20 NOTE — Telephone Encounter (Signed)
Patient already had labs done and wants referral to endo

## 2013-03-20 ENCOUNTER — Encounter: Payer: Self-pay | Admitting: Family Medicine

## 2013-03-20 ENCOUNTER — Encounter: Payer: Self-pay | Admitting: *Deleted

## 2013-03-20 ENCOUNTER — Encounter: Payer: 59 | Admitting: Family Medicine

## 2013-03-20 NOTE — Progress Notes (Signed)
This encounter was created in error - please disregard.

## 2013-04-10 ENCOUNTER — Encounter: Payer: 59 | Admitting: Family Medicine

## 2013-09-12 LAB — HM PAP SMEAR: HM PAP: NEGATIVE

## 2014-08-23 NOTE — H&P (Signed)
PATIENT NAME:  Krista HabermannCATTOUSE [name], Krista MR#:  [id number] DATE OF BIRTH:  08-05-1970  DATE OF ADMISSION:  07/03/2012  REFERRING PHYSICIAN: Cecille AmsterdamJonathan E. Mayford KnifeWilliams, MD  ATTENDING PHYSICIAN: Nyari Olsson B. Tyshana Nishida, MD  IDENTIFYING DATA: The patient is a 44 year old female with history of bipolar disorder.   CHIEF COMPLAINT: "I'm manic."  HISTORY OF PRESENT ILLNESS: The patient was hospitalized for a bipolar manic episode for the first time in her life. She has not been compliant with medications for the past year. She stopped taking Depakote because it causes massive hair loss. She reports that in the past week, she suddenly developed symptoms of mania. She stopped sleeping, started feeling agitated, irritable, joyful for no reason with singing and dancing, some agitation. She realized that she is developing a manic episode and asked her boyfriend to bring her to the hospital. He waited until the following morning when it was, according to the patient, too late. She denies psychotic symptoms at the moment, but does have a history of psychosis during her manic episodes. She has been agitated, uncooperative and unruly in the Emergency Room, and spent a couple of days there waiting for her symptoms to subside. Dr. Guss Bundehalla started the patient on Depakote and Zyprexa. She seems to tolerate the medications well. She was able to hold a conversation with me for 20 minutes, but earlier today she was loud, intrusive, hyperactive, agitated, singing on the unit, slightly irritable. She denies alcohol, prescription drug or illicit substance use.   PAST PSYCHIATRIC HISTORY: She was diagnosed at the age of 44 when she had her first manic episode. She had no problems for the first 10 years. Recently she has manic episodes every 2 years. Her second episode happened after her second pregnancy. She has not been compliant with medications outside of episodes and hospitalization. She does not have a provider in the community. She  attempted suicide twice, even though she reports no periods of depression. She feels that she is always hypomanic, high energy, but experiences defined episodes of elated mood.   FAMILY PSYCHIATRIC HISTORY: Her mother with depression.   PAST MEDICAL HISTORY: None.   MEDICATIONS ON ADMISSION: None.   ALLERGIES: No known drug allergies.   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 She would like to do a PhD and become a Runner, broadcasting/film/videoteacher. 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 She had been married for 7 years, is divorced now. She is in a relationship with a supportive boyfriend. She has 3 children. Her mother is her healthcare power of attorney.   REVIEW OF SYSTEMS:  CONSTITUTIONAL: No fevers or chills. No weight changes.  EYES: No double or blurred vision.  ENT: No hearing loss.  RESPIRATORY: No shortness of breath or cough.  CARDIOVASCULAR: No chest pain or orthopnea.  GASTROINTESTINAL: No abdominal pain, nausea, vomiting or diarrhea.  GENITOURINARY: No incontinence or frequency.  ENDOCRINE: No heat or cold intolerance.  LYMPHATIC: No anemia or easy bruising.  INTEGUMENTARY: No acne or rash.  NEUROLOGIC: No tingling or weakness.  PSYCHIATRIC: See history of present illness for details.   PHYSICAL EXAMINATION:  VITAL SIGNS: Blood pressure 146/93, pulse 102, respirations 18, temperature 97.8.  GENERAL: This is a slightly obese female in no acute distress.  HEENT: The pupils are equal, round and reactive to light. Sclerae anicteric.  NECK: Supple. No thyromegaly.  LUNGS: Clear to auscultation. No dullness to percussion.  HEART: Regular rhythm and rate. No murmurs, rubs or gallops.  ABDOMEN: Soft, nontender, nondistended. Positive bowel sounds.  MUSCULOSKELETAL: Normal muscle strength in all extremities.  SKIN: No rashes or bruises.  LYMPHATIC: No cervical adenopathy.  NEUROLOGIC: Cranial nerves II through XII are intact.   LABORATORY DATA:  Chemistries are within normal limits. Blood alcohol level zero. Beta hCG subunit less than 1. LFTs within normal limits. TSH 5.33. Urine tox screen negative for substances. CBC within normal limits except for white blood count of 13. Urinalysis is not suggestive of urinary tract infection. Serum acetaminophen and salicylates are low.   MENTAL STATUS EXAMINATION ON ADMISSION: The patient is alert and oriented to person, place, time and situation. She is pleasant, polite and cooperative. She maintains good eye contact. She is adequately groomed. She wears hospital scrubs. Her speech is rapid and loud at times. Her mood is excellent with expansive affect. Thought processing is logical and goal oriented. Thought content: She denies suicidal or homicidal ideation. She is grandiose. There are no auditory or visual hallucinations. Her cognition is grossly intact. She registers 3 out of 3 and recalls 3 out of 3 objects after 5 minutes. She can spell "world" forwards and backwards. She knows the current president. Her insight and judgment are questionable.   SUICIDE RISK ASSESSMENT ON ADMISSION: This is a patient with a long history of bipolar illness who came to the hospital in a manic episode in the context of medication noncompliance.   DIAGNOSES: AXIS I: Bipolar affective disorder, mania.  AXIS II: Deferred.  AXIS III: Obesity, hypothyroidism status post radioablation.  AXIS IV: Mental illness, treatment compliance.  AXIS V: Global Assessment of Functioning: 25.   PLAN: The patient was admitted to [facility] Medicine Unit for safety, stabilization and medication management. She was initially placed on suicide precautions and was closely monitored for any unsafe behaviors. She underwent full psychiatric and risk assessment. She received pharmacotherapy, individual and group psychotherapy, substance abuse counseling, and support from therapeutic milieu.  1.  Mania: The patient  was started on Zyprexa and Depakote. She is almost certain that she will not to be compliant with it in the community. Depakote causes weight gain and hair loss. Zyprexa causes weight gain. She will opt for different medications.  2.  Medical: We will monitor thyroid function tests. TSH is slightly elevated. She has never been on Synthroid.  3.  Disposition: She will return to home with her boyfriend. She needs a new psychiatrist in the community.   ____________________________ Ellin Goodie Jennet Maduro, MD jbp:jm D: 07/04/2012 16:16:23 ET T: 07/04/2012 17:11:50 ET JOB#: [id number]  cc: Ludmila Ebarb B. Jennet Maduro, MD, <Dictator> Shari Prows MD ELECTRONICALLY SIGNED 07/30/2012 10:35

## 2014-10-01 ENCOUNTER — Emergency Department
Admission: EM | Admit: 2014-10-01 | Discharge: 2014-10-01 | Disposition: A | Discharge status: Home / Self Care | Payer: 59

## 2014-10-01 DIAGNOSIS — F3110 Bipolar disorder, current episode manic without psychotic features, unspecified: Secondary | ICD-10-CM | POA: Diagnosis not present

## 2014-10-01 DIAGNOSIS — Z79899 Other long term (current) drug therapy: Secondary | ICD-10-CM | POA: Diagnosis not present

## 2014-10-01 DIAGNOSIS — F23 Brief psychotic disorder: Secondary | ICD-10-CM | POA: Insufficient documentation

## 2014-10-01 DIAGNOSIS — G4700 Insomnia, unspecified: Secondary | ICD-10-CM | POA: Diagnosis present

## 2014-10-02 ENCOUNTER — Inpatient Hospital Stay
Admission: EM | Admit: 2014-10-02 | Discharge: 2014-10-07 | DRG: 885 | Disposition: A | Discharge status: Home / Self Care | Payer: 59 | Source: Intra-hospital | Attending: Psychiatry | Admitting: Psychiatry

## 2014-10-02 ENCOUNTER — Encounter: Payer: Self-pay | Admitting: Psychiatry

## 2014-10-02 ENCOUNTER — Emergency Department
Admission: EM | Admit: 2014-10-02 | Discharge: 2014-10-02 | Disposition: A | Discharge status: Other Acute Inpatient Hospital | Payer: BC Managed Care – PPO | Attending: Emergency Medicine | Admitting: Emergency Medicine

## 2014-10-02 DIAGNOSIS — E039 Hypothyroidism, unspecified: Secondary | ICD-10-CM | POA: Diagnosis present

## 2014-10-02 DIAGNOSIS — F312 Bipolar disorder, current episode manic severe with psychotic features: Secondary | ICD-10-CM | POA: Diagnosis present

## 2014-10-02 DIAGNOSIS — F23 Brief psychotic disorder: Secondary | ICD-10-CM | POA: Diagnosis not present

## 2014-10-02 DIAGNOSIS — F3110 Bipolar disorder, current episode manic without psychotic features, unspecified: Secondary | ICD-10-CM

## 2014-10-02 DIAGNOSIS — Z9114 Patient's other noncompliance with medication regimen: Secondary | ICD-10-CM | POA: Diagnosis present

## 2014-10-02 LAB — COMPREHENSIVE METABOLIC PANEL
ALBUMIN: 4.7 g/dL (ref 3.5–5.0)
ALT: 17 U/L (ref 14–54)
AST: 23 U/L (ref 15–41)
Alkaline Phosphatase: 79 U/L (ref 38–126)
Anion gap: 12 (ref 5–15)
BUN: 16 mg/dL (ref 6–20)
CHLORIDE: 104 mmol/L (ref 101–111)
CO2: 22 mmol/L (ref 22–32)
Calcium: 9.2 mg/dL (ref 8.9–10.3)
Creatinine, Ser: 0.73 mg/dL (ref 0.44–1.00)
GFR calc non Af Amer: >60 mL/min (ref >60)
GLUCOSE: 182 mg/dL — AB (ref 65–99)
POTASSIUM: 3.2 mmol/L — AB (ref 3.5–5.1)
SODIUM: 138 mmol/L (ref 135–145)
TOTAL PROTEIN: 8.3 g/dL — AB (ref 6.5–8.1)
Total Bilirubin: 0.4 mg/dL (ref 0.3–1.2)

## 2014-10-02 LAB — CBC
HCT: 45 % (ref 35.0–47.0)
Hemoglobin: 14.9 g/dL (ref 12.0–16.0)
MCH: 29.3 pg (ref 26.0–34.0)
MCHC: 33 g/dL (ref 32.0–36.0)
MCV: 88.6 fL (ref 80.0–100.0)
Platelets: 410 10*3/uL (ref 150–440)
RBC: 5.08 MIL/uL (ref 3.80–5.20)
RDW: 12.4 % (ref 11.5–14.5)
WBC: 16.5 10*3/uL — ABNORMAL HIGH (ref 3.6–11.0)

## 2014-10-02 LAB — ACETAMINOPHEN LEVEL

## 2014-10-02 LAB — SALICYLATE LEVEL

## 2014-10-02 LAB — ETHANOL: Alcohol, Ethyl (B): <5 mg/dL (ref <5)

## 2014-10-02 MED ORDER — LORAZEPAM 2 MG/ML IJ SOLN
INTRAMUSCULAR | Status: AC
  Filled 2014-10-02: qty 1

## 2014-10-02 MED ORDER — ACETAMINOPHEN 325 MG PO TABS
650.0000 mg | ORAL_TABLET | Freq: Four times a day (QID) | ORAL | Status: DC | PRN
  Administered 2014-10-04 – 2014-10-05 (×2): 650 mg via ORAL
  Filled 2014-10-02 (×2): qty 2

## 2014-10-02 MED ORDER — ZIPRASIDONE HCL 40 MG PO CAPS
40.0000 mg | ORAL_CAPSULE | Freq: Two times a day (BID) | ORAL | Status: DC
  Administered 2014-10-03 – 2014-10-04 (×3): 40 mg via ORAL
  Filled 2014-10-02 (×3): qty 1

## 2014-10-02 MED ORDER — ALUM & MAG HYDROXIDE-SIMETH 200-200-20 MG/5ML PO SUSP: 30.0000 mL | ORAL | Status: DC | PRN

## 2014-10-02 MED ORDER — MAGNESIUM HYDROXIDE 400 MG/5ML PO SUSP
30.0000 mL | Freq: Every day | ORAL | Status: DC | PRN
  Administered 2014-10-04 – 2014-10-07 (×2): 30 mL via ORAL
  Filled 2014-10-02 (×2): qty 30

## 2014-10-02 MED ORDER — ADULT MULTIVITAMIN W/MINERALS CH
1.0000 | ORAL_TABLET | Freq: Every day | ORAL | Status: DC
  Administered 2014-10-03 – 2014-10-07 (×5): 1 via ORAL
  Filled 2014-10-02 (×7): qty 1

## 2014-10-02 MED ORDER — CARBAMAZEPINE 200 MG PO TABS
200.0000 mg | ORAL_TABLET | Freq: Two times a day (BID) | ORAL | Status: DC
  Administered 2014-10-02 – 2014-10-07 (×10): 200 mg via ORAL
  Filled 2014-10-02 (×10): qty 1

## 2014-10-02 MED ORDER — LORAZEPAM 2 MG/ML IJ SOLN
2.0000 mg | Freq: Once | INTRAMUSCULAR | Status: AC
  Administered 2014-10-02: 2 mg via INTRAMUSCULAR

## 2014-10-02 MED ORDER — LORAZEPAM 2 MG/ML IJ SOLN
INTRAMUSCULAR | Status: AC
  Administered 2014-10-02: 2 mg via INTRAMUSCULAR
  Filled 2014-10-02: qty 1

## 2014-10-02 MED ORDER — DIPHENHYDRAMINE HCL 50 MG/ML IJ SOLN
50.0000 mg | Freq: Once | INTRAMUSCULAR | Status: AC
  Administered 2014-10-02: 50 mg via INTRAVENOUS

## 2014-10-02 MED ORDER — TEMAZEPAM 15 MG PO CAPS
15.0000 mg | ORAL_CAPSULE | Freq: Every evening | ORAL | Status: DC | PRN
  Administered 2014-10-02: 15 mg via ORAL
  Filled 2014-10-02 (×2): qty 1

## 2014-10-02 MED ORDER — ZIPRASIDONE MESYLATE 20 MG IM SOLR
20.0000 mg | Freq: Once | INTRAMUSCULAR | Status: AC
  Administered 2014-10-02: 20 mg via INTRAMUSCULAR

## 2014-10-02 MED ORDER — DIPHENHYDRAMINE HCL 50 MG/ML IJ SOLN
INTRAMUSCULAR | Status: AC
  Administered 2014-10-02: 50 mg via INTRAVENOUS
  Filled 2014-10-02: qty 1

## 2014-10-02 MED ORDER — LEVOTHYROXINE SODIUM 75 MCG PO TABS
75.0000 ug | ORAL_TABLET | Freq: Every day | ORAL | Status: DC
  Administered 2014-10-03 – 2014-10-04 (×2): 75 ug via ORAL
  Filled 2014-10-02 (×2): qty 1

## 2014-10-02 MED ORDER — ZIPRASIDONE MESYLATE 20 MG IM SOLR
INTRAMUSCULAR | Status: AC
  Administered 2014-10-02: 20 mg via INTRAMUSCULAR
  Filled 2014-10-02: qty 20

## 2014-10-02 NOTE — ED Provider Notes (Signed)
-----------------------------------------   4:11 PM on 10/02/2014 -----------------------------------------  Discussed with Dr. Toni Amendlapacs in the ED. Plan to admit the patient to inpatient psychiatry for further stabilization of her bipolar disorder.  Sharman CheekPhillip Maxfield Gildersleeve, MD 10/02/14 [phone]
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
## 2014-10-02 NOTE — Progress Notes (Signed)
Patient ID: Krista HabermannChelsea Cattouse Wilson, female   DOB: 02-Jan-1971, 44 y.o.   MRN: [id number] 44 yr old Female admitted with Bipolar with psychosis.On arrival to the unit patient was lethargic & sleepy.She closes her eyes & walks.Denies any problem for vision.Patient was not able to answer some of the questions.She is oriented to time,person & place.Stated "I came to hospital to get my Bipolar medicine."No contraband found on admission.She is depressed & started crying for simple questions.Denies suicidal ideation.Positve for auditory hallucination.States "somebody told me to follow me".will monitor the patient.

## 2014-10-02 NOTE — ED Notes (Signed)
Patient up to doorway attempting to leave room and hallway x2. Patient redirected back to room at which time she attempted to fall to floor, patient assisted back to room by security staff as well as nursing staff. Dr. Inocencio HomesGayle to bedside IM Geodon ordered.

## 2014-10-02 NOTE — ED Notes (Signed)
BEHAVIORAL HEALTH ROUNDING Patient sleeping: No. 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 Nutrition and fluids offered: Yes  Toileting and hygiene offered: Yes  Sitter present: no Law enforcement present: Yes, ODS

## 2014-10-02 NOTE — BHH Counselor (Signed)
[facility] by Dr. Toni Amendlapacs. Attending Physician will be Dr. Jennet MaduroPucilowska.  Pt. [facility] 302[facility] Jerry CarasWendy S.  Intake Paper Work has been signed and placed on pt. chart. ER staff Rivka Barbara( Glenda, ER Sect.& Misty G., RN) have been made aware of the admission.

## 2014-10-02 NOTE — ED Provider Notes (Signed)
-----------------------------------------   1:48 PM on 10/02/2014 -----------------------------------------  The patient is awake, crying, screaming loudly "I am [place] She is attempting to run out of the emergency department. 20 mg of IM Haldol ordered.  Gayla DossEryka A Aariah Godette, MD 10/02/14 [phone]

## 2014-10-02 NOTE — ED Notes (Signed)
BEHAVIORAL HEALTH ROUNDING Patient sleeping: NO Patient alert and oriented: YES Behavior appropriate: NO Describe behavior: No inappropriate or unacceptable behaviors noted at this time.  Nutrition and fluids offered: YES Toileting and hygiene offered: YES Sitter present: Interior and spatial designerBehavioral tech rounding every 15 minutes on patient to ensure safety.  Law enforcement present: Loss adjuster, charteredYES Law enforcement agency: [employer] ([employer])

## 2014-10-02 NOTE — ED Notes (Signed)
Patient with short outbursts of yelling. Staff member over to speak with patient. NAD noted.

## 2014-10-02 NOTE — ED Notes (Signed)
BEHAVIORAL HEALTH ROUNDING Patient sleeping: No. Patient alert and oriented: patient alert&Ox1 Behavior appropriate: No.; If no, describe: Patient wondering hallways as if sleep walking.  Not making sense when speaking.   Nutrition and fluids offered: Yes  Toileting and hygiene offered: Yes  Sitter present: yes Law enforcement present: Yes

## 2014-10-02 NOTE — ED Notes (Signed)
BEHAVIORAL HEALTH ROUNDING Patient sleeping: Yes.   Patient alert and oriented: not applicable due to patient sleeping Behavior appropriate: Yes.  ; If no, describe:  Nutrition and fluids offered: No Toileting and hygiene offered: No Sitter present: yes Law enforcement present: Yes

## 2014-10-02 NOTE — ED Notes (Signed)
MD at bedside. 

## 2014-10-02 NOTE — ED Notes (Addendum)
Patient ambulatory to triage with steady gait, without difficulty or distress noted; pt reports not sleeping well, 3hrs sleep over last 24hours; st ran out of latuda today

## 2014-10-02 NOTE — ED Provider Notes (Signed)
[facility] Emergency Department Provider Note  ____________________________________________  Time seen: 5:10 AM  I have reviewed the triage vital signs and the nursing notes.   HISTORY  Chief Complaint Insomnia      HPI [name] is a 44 y.o. female presents to the emergency department initially for insomnia stating that she ran out of her Jordan. However patient brought back to treatment area due to yelling and cursing nonsensical speech in the waiting room. Patient agitated and aggressive on arrival to treatment room cursing repeatedly at the staff.Refer to triage note for specifics in regards to statements that were made     Past Medical History  Diagnosis Date  . Other malaise and fatigue   . Unspecified hypothyroidism   . Other bipolar disorders     Patient Active Problem List   Diagnosis Date Noted  . Numbness and tingling in hands 06/29/2012  . HYPOTHYROIDISM NOS 01/25/2007  . DISORDER, BIPOLAR NEC 01/25/2007  . FATIGUE, CHRONIC 01/25/2007    No past surgical history on file.  Current Outpatient Rx  Name  Route  Sig  Dispense  Refill  . levothyroxine (SYNTHROID, LEVOTHROID) 75 MCG tablet   Oral   Take 75 mcg by mouth daily before breakfast.         . Multiple Vitamin (MULTIVITAMIN) tablet   Oral   Take 1 tablet by mouth daily.         . NON FORMULARY   Oral   Take 1 tablet by mouth daily. Hair/Skin Vitamin           Allergies Review of patient's allergies indicates no known allergies.  Family History  Problem Relation Age of Onset  . Mental illness Mother   . Alzheimer's disease Maternal Grandmother   . Heart attack Maternal Grandfather     Social History History  Substance Use Topics  . Smoking status: Never Smoker   . Smokeless tobacco: Not on file  . Alcohol Use: No    Review of Systems  Constitutional: Negative for fever. Eyes: Negative for visual changes. ENT: Negative for sore  throat. Cardiovascular: Negative for chest pain. Respiratory: Negative for shortness of breath. Gastrointestinal: Negative for abdominal pain, vomiting and diarrhea. Genitourinary: Negative for dysuria. Musculoskeletal: Negative for back pain. Skin: Negative for rash. Neurological: Negative for headaches, focal weakness or numbness. Psychiatric: Acute psychosis and insomnia  10-point ROS otherwise negative.  ____________________________________________   PHYSICAL EXAM:  VITAL SIGNS: ED Triage Vitals  Enc Vitals Group     BP 10/02/14 0037 188/90 mmHg     Pulse Rate 10/02/14 0037 100     Resp 10/02/14 0037 20     Temp --      Temp Source 10/02/14 0037 Oral     SpO2 10/02/14 0037 98 %     Weight 10/02/14 0037 173 lb (78.472 kg)     Height 10/02/14 0037  (1.549 m)     Head Cir --      Peak Flow --      Pain Score --      Pain Loc --      Pain Edu? --      Excl. in GC? --     Constitutional: Alert agitated and combative Eyes: Conjunctivae are normal. PERRL. Normal extraocular movements. ENT   Head: Normocephalic and atraumatic.   Nose: No congestion/rhinnorhea.   Mouth/Throat: Mucous membranes are moist.   Neck: No stridor. Hematological/Lymphatic/Immunilogical: No cervical lymphadenopathy. Cardiovascular: Normal rate, regular rhythm.  Normal and symmetric distal pulses are present in all extremities. No murmurs, rubs, or gallops. Respiratory: Normal respiratory effort without tachypnea nor retractions. Breath sounds are clear and equal bilaterally. No wheezes/rales/rhonchi. Gastrointestinal: Soft and nontender. No distention. There is no CVA tenderness. Genitourinary: deferred Musculoskeletal: Nontender with normal range of motion in all extremities. No joint effusions.  No lower extremity tenderness nor edema. Neurologic:  Normal speech and language. No gross focal neurologic deficits are appreciated. Speech is normal.  Skin:  Skin is warm, dry and  intact. No rash noted. Psychiatric: Agitated combative nonsensical speech that is pressured. ____________________________________________    LABS (pertinent positives/negatives)   ____________________________________________          INITIAL IMPRESSION / ASSESSMENT AND PLAN / ED COURSE  Pertinent labs & imaging results that were available during my care of the patient were reviewed by me and considered in my medical decision making (see chart for details).  Given potential risks to the patient and staff patient was chemically sedated with Geodon 20 mg Ativan 2 mg and Benadryl 50 mg.  ____________________________________________   FINAL CLINICAL IMPRESSION(S) / ED DIAGNOSES  Final diagnoses:  Acute psychosis      Darci Currentandolph N Brown, MD 10/04/14 [phone]

## 2014-10-02 NOTE — ED Notes (Signed)
Patient yelling... 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 I dont act act act. 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 I am crazy... Not crazy... Crazy!!" Patient to be taken back to ED 20.

## 2014-10-02 NOTE — ED Notes (Signed)
Patient with continued episodes of agitation, anxiety and paranoia. Patient is able to be redirected however will not stay in room. Singing loudly, talking with flight of ideas.

## 2014-10-02 NOTE — ED Notes (Signed)
Pt resting in bed with eyes closed. Pt has no needs or concerns at this time. Will continue to monitor and f/u as needed.

## 2014-10-02 NOTE — B[facility] Counselor (Signed)
[facility] and they are aware of the pt. Being admitted to the unit.

## 2014-10-02 NOTE — Consult Note (Signed)
[facility] Consult   Reason for Consult:  Consult for this patient with a history of bipolar disorder brought into the hospital voluntarily because of recent psychosis Referring Physician:  gayle Patient Identification: [name] MRN:  [id number] Principal Diagnosis: <principal problem not specified> Diagnosis:   Patient Active Problem List   Diagnosis Date Noted  . Numbness and tingling in hands [R20.2] 06/29/2012  . HYPOTHYROIDISM NOS [E03.9] 01/25/2007  . DISORDER, BIPOLAR NEC [F31.89] 01/25/2007  . FATIGUE, CHRONIC [R53.81, R53.83] 01/25/2007    Total Time spent with patient: 1 hour  Subjective:   [name] is a 44 y.o. female patient admitted with "I was crucified yesterday" patient was brought into the hospital voluntarily by her boyfriend because of rapid decompensation into psychosis.  HPI:  Information from the patient from the chart and from her boyfriend. Patient was seen in the emergency room. She was not a very good historian. She tells me that she is here because she was crucified to death yesterday although today she is feeling pretty good. She is not able to give me a clear history of changes in her mental status but is able to tell me that she had been running out of her psychiatric medication and had not been fully compliant with it. She claims that she had been sleeping adequately. Denies any hallucinations. Boyfriend tells me that she had been functioning well up until about 48 hours ago when he noticed that she was starting to get confused and not making sense. He brought her into the emergency room but apparently the long wait initially dissuaded them and they went home but because she continued to decompensate he brought her back. He says that she is probably only slept 4 hours in the last 2 days. He says that she had been running out of her psychiatric medicine, which is little to do, and had been gradually tapering herself down  to where she was probably only taking 1 dose a week. He did take it yesterday but apparently it wasn't enough. He says now for the last couple days she is talking incoherently. When she first presented to the emergency room she was agitated and clearly appeared to be more manic. Patient has not made any suicidal or homicidal statements and has not been assaultive or aggressive or attempted to harm herself. There is no evidence of alcohol or drugs being involved. 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  Past psychiatric history: Patient has a prior diagnosis of bipolar disorder. She was hospitalized here in 2014 with a manic episode. Stabilized on Depakote and Zyprexa. She has a history of noncompliance outside hospital. She had been seeing Dr. Gretel Acre as an outpatient but not going very often as mentioned above was coming off her medicine. No history of suicide attempts or violence. Doesn't appear to have had any hospitalizations since she was here previously.   Substance abuse history: Drinks infrequently and not problematically. No history of drug or alcohol abuse.  Social history: Patient lives with her boyfriend. She has 3 young children ages 73 and 74 and 10. She works as a second Land. He says that at baseline she is been functioning well.  Medical history: Has a previous diagnosis of hypothyroidism. It's not clear if that is still an active problem. Neither she or the boyfriend was aware of her being on  any medicine for it.  Family history: Possibly family history positive in an aunt but the history is unclear.  Medicine: Latuda 60 mg by mouth daily HPI Elements:   Quality:  Agitation confusion psychosis poor sleep. Severity:  Moderate to severe. Timing:  Rapid onset worse over the last 2 days. Duration:  Part of a chronic mental health illness. Context:   Recent decrease in medication doses because of noncompliance and stress related to the end of the school year.  Past Medical History:  Past Medical History  Diagnosis Date  . Other malaise and fatigue   . Unspecified hypothyroidism   . Other bipolar disorders    No past surgical history on file. Family History:  Family History  Problem Relation Age of Onset  . Mental illness Mother   . Alzheimer's disease Maternal Grandmother   . Heart attack Maternal Grandfather    Social History:  History  Alcohol Use No     History  Drug Use No    History   Social History  . Marital Status: Single    Spouse Name: N/A  . Number of Children: 3  . Years of Education: N/A   Occupational History  . teacher    Social History Main Topics  . Smoking status: Never Smoker   . Smokeless tobacco: Not on file  . Alcohol Use: No  . Drug Use: No  . Sexual Activity: Not on file   Other Topics Concern  . Not on file   Social History Narrative   Regular exercise yes      Diet: fruits and veggies   Additional Social History:    History of alcohol / drug use?: No history of alcohol / drug abuse                     Allergies:  No Known Allergies  Labs:  Results for orders placed or performed during the hospital encounter of 10/02/14 (from the past 48 hour(s))  Acetaminophen level     Status: Abnormal   Collection Time: 10/02/14  5:44 AM  Result Value Ref Range   Acetaminophen (Tylenol), Serum <10 (L) 10 - 30 ug/mL    Comment:        THERAPEUTIC CONCENTRATIONS VARY SIGNIFICANTLY. A RANGE OF 10-30 ug/mL MAY BE AN EFFECTIVE CONCENTRATION FOR MANY PATIENTS. HOWEVER, SOME ARE BEST TREATED AT CONCENTRATIONS OUTSIDE THIS RANGE. ACETAMINOPHEN CONCENTRATIONS >150 ug/mL AT 4 HOURS AFTER INGESTION AND >50 ug/mL AT 12 HOURS AFTER INGESTION ARE OFTEN ASSOCIATED WITH TOXIC REACTIONS.   CBC     Status: Abnormal   Collection Time: 10/02/14  5:44 AM  Result Value Ref Range   WBC  16.5 (H) 3.6 - 11.0 K/uL   RBC 5.08 3.80 - 5.20 MIL/uL   Hemoglobin 14.9 12.0 - 16.0 g/dL   HCT 45.0 35.0 - 47.0 %   MCV 88.6 80.0 - 100.0 fL   MCH 29.3 26.0 - 34.0 pg   MCHC 33.0 32.0 - 36.0 g/dL   RDW 12.4 11.5 - 14.5 %   Platelets 410 150 - 440 K/uL  Comprehensive metabolic panel     Status: Abnormal   Collection Time: 10/02/14  5:44 AM  Result Value Ref Range   Sodium 138 135 - 145 mmol/L   Potassium 3.2 (L) 3.5 - 5.1 mmol/L   Chloride 104 101 - 111 mmol/L   CO2 22 22 - 32 mmol/L   Glucose, Bld 182 (H) 65 - 99 mg/dL   BUN  16 6 - 20 mg/dL   Creatinine, Ser 0.73 0.44 - 1.00 mg/dL   Calcium 9.2 8.9 - 10.3 mg/dL   Total Protein 8.3 (H) 6.5 - 8.1 g/dL   Albumin 4.7 3.5 - 5.0 g/dL   AST 23 15 - 41 U/L   ALT 17 14 - 54 U/L   Alkaline Phosphatase 79 38 - 126 U/L   Total Bilirubin 0.4 0.3 - 1.2 mg/dL   GFR calc non Af Amer >60 >60 mL/min   GFR calc Af Amer >60 >60 mL/min    Comment: (NOTE) The eGFR has been calculated using the CKD EPI equation. This calculation has not been validated in all clinical situations. eGFR's persistently <60 mL/min signify possible Chronic Kidney Disease.    Anion gap 12 5 - 15  Ethanol (ETOH)     Status: None   Collection Time: 10/02/14  5:44 AM  Result Value Ref Range   Alcohol, Ethyl (B) <5 <5 mg/dL    Comment:        LOWEST DETECTABLE LIMIT FOR SERUM ALCOHOL IS 11 mg/dL FOR MEDICAL PURPOSES ONLY   Salicylate level     Status: None   Collection Time: 10/02/14  5:44 AM  Result Value Ref Range   Salicylate Lvl <5.1 2.8 - 30.0 mg/dL    Vitals: Blood pressure 169/81, pulse 114, resp. rate 20, height 5' 1" (1.549 m), weight 78.472 kg (173 lb), SpO2 99 %.  Risk to Self: Suicidal Ideation: No Suicidal Intent: No Is patient at risk for suicide?: No Suicidal Plan?: No Access to Means: No What has been your use of drugs/alcohol within the last 12 months?: No usage How many times?: 0 Other Self Harm Risks: None reported Triggers for Past  Attempts: None known Intentional Self Injurious Behavior: None Risk to Others: Homicidal Ideation: No Thoughts of Harm to Others: No Current Homicidal Intent: No Current Homicidal Plan: No Access to Homicidal Means: No Identified Victim: None History of harm to others?: No Assessment of Violence: On admission Does patient have access to weapons?: No Criminal Charges Pending?: No Does patient have a court date: No Prior Inpatient Therapy: Prior Inpatient Therapy: Yes Prior Therapy Dates: 2014 Prior Therapy Facilty/Provider(s): [facility] Reason for Treatment: Bipolar  Prior Outpatient Therapy:    No current facility-administered medications for this encounter.   Current Outpatient Prescriptions  Medication Sig Dispense Refill  . levothyroxine (SYNTHROID, LEVOTHROID) 75 MCG tablet Take 75 mcg by mouth daily before breakfast.    . Multiple Vitamin (MULTIVITAMIN) tablet Take 1 tablet by mouth daily.    . Multiple Vitamins-Minerals (HAIR/SKIN/NAILS) TABS Take 1 tablet by mouth daily.      Musculoskeletal: Strength & Muscle Tone: within normal limits Gait & Station: Patient is sedated right now and I did not have her stand up but as far as I know she is able to walk fine at baseline Patient leans: N/A  Psychiatric Specialty Exam: Physical Exam  Constitutional: She appears well-developed and well-nourished.  HENT:  Head: Normocephalic and atraumatic.  Eyes: Conjunctivae are normal. Pupils are equal, round, and reactive to light.  Neck: Normal range of motion.  Cardiovascular: Normal heart sounds.   Respiratory: Effort normal.  GI: Soft.  Musculoskeletal: Normal range of motion.  Neurological: She is alert.  Skin: Skin is warm and dry.  Psychiatric: Her affect is blunt and labile. Her speech is slurred. She is slowed and withdrawn. Thought content is delusional. Cognition and memory are impaired. She expresses impulsivity. She exhibits  abnormal recent memory and  abnormal remote memory.    Review of Systems  Constitutional: Negative.   HENT: Negative.   Eyes: Negative.   Respiratory: Negative.   Cardiovascular: Negative.   Gastrointestinal: Negative.   Musculoskeletal: Negative.   Skin: Negative.   Neurological: Negative.   Psychiatric/Behavioral: Positive for memory loss. Negative for depression, suicidal ideas, hallucinations and substance abuse. The patient is nervous/anxious and has insomnia.     Blood pressure 169/81, pulse 114, resp. rate 20, height 5' 1" (1.549 m), weight 78.472 kg (173 lb), SpO2 99 %.Body mass index is 32.7 kg/(m^2).  General Appearance: Disheveled  Eye Contact::  Poor  Speech:  Garbled and Slow  Volume:  Decreased  Mood:  Euphoric  Affect:  Labile  Thought Process:  Disorganized and Tangential  Orientation:  Full (Time, Place, and Person)  Thought Content:  Delusions  Suicidal Thoughts:  No  Homicidal Thoughts:  No  Memory:  Immediate;   Good Recent;   Good Remote;   Fair  Judgement:  Impaired  Insight:  Shallow  Psychomotor Activity:  Decreased  Concentration:  Fair  Recall:  [employer] of Knowledge:Fair  Language: Good  Akathisia:  No  Handed:  Right  AIMS (if indicated):     Assets:  Communication Skills Desire for Improvement Financial Resources/Insurance Housing Intimacy Physical Health Social Support  ADL's:  Intact  Cognition: Impaired,  Mild  Sleep:      Medical Decision Making: Review of Psycho-Social Stressors (1), Established Problem, Worsening (2), Review of Last Therapy Session (1), Review or order medicine tests (1), Review of Medication Regimen & Side Effects (2) and Review of New Medication or Change in Dosage (2)  Treatment Plan Summary: Plan Patient with a history of bipolar disorder seems to present with a rapid decompensation into psychosis with some manic features. She has been given medicine in the emergency room and is now sedated but is still showing clang associations and  flight of ideas and evidence of grandiosity and euphoria. Patient needs hospitalization because of acute psychosis. Patient will be admitted to the psychiatry ward downstairs. Commitment paperwork was filed in the emergency room. Restart her outpatient medicines. Follow-up treatment per the admitting team. Spoke to the boyfriend and advised him of the plan. Patient also is aware. Case discussed with emergency room physician and psychiatric team.  Plan:  Recommend psychiatric Inpatient admission when medically cleared. Discussed crisis plan, support from social network, calling 911, coming to the Emergency Department, and calling Suicide Hotline. Disposition: Admit to the hospital    10/02/2014 12:32 PM

## 2014-10-02 NOTE — ED Notes (Signed)
Patient with (+) verbal escalation. Patient yelling and singing; (+) thoughts of grandeur expressed. Patient took glasses off and stated, "Mr. Magoo. These are not real glasses. See" as she proceeded to snap eye glasses in half. Glasses provided to SO who states, "Oh. She did that again. That was her last good pair". RN discussed this behavior with SO who advised that patient has intentionally broken glasses before. Reports [facility] admission in 2014.

## 2014-10-02 NOTE — ED Notes (Signed)
BEHAVIORAL HEALTH ROUNDING Patient sleeping: No. Patient alert and oriented: yes A&O x1 Behavior appropriate: No.; If no, describe: wondering out of room, able to redirect back to bed Nutrition and fluids offered: Yes  Toileting and hygiene offered: Yes  Sitter present: yes Law enforcement present: Yes

## 2014-10-02 NOTE — ED Notes (Signed)
ENVIRONMENTAL ASSESSMENT Potentially harmful objects out of patient reach: Yes.   Personal belongings secured: Yes sent with boyfriend Patient dressed in hospital provided attire only: Yes.   Plastic bags out of patient reach: Yes.   Patient care equipment (cords, cables, call bells, lines, and drains) shortened, removed, or accounted for: Yes.   Equipment and supplies removed from bottom of stretcher: Yes.   Potentially toxic materials out of patient reach: Yes.   Sharps container removed or out of patient reach: Yes.

## 2014-10-02 NOTE — ED Notes (Signed)

## 2014-10-02 NOTE — Tx Team (Signed)
Initial Interdisciplinary Treatment Plan   PATIENT STRESSORS: Financial difficulties Marital or family conflict Medication change or noncompliance   PATIENT STRENGTHS: General fund of knowledge Religious Affiliation Supportive family/friends   PROBLEM LIST: Problem List/Patient Goals Date to be addressed Date deferred Reason deferred Estimated date of resolution  Risk for suicide 10/02/14     Psychosis 10/02/14     Non-compliant with m/edications/ 10/02/14     "want to work on nothing" 10/02/14                                    DISCHARGE CRITERIA:  Improved stabilization in mood, thinking, and/or behavior Motivation to continue treatment in a less acute level of care Verbal commitment to aftercare and medication compliance  PRELIMINARY DISCHARGE PLAN: Attend PHP/IOP Outpatient therapy  PATIENT/FAMIILY INVOLVEMENT: This treatment plan has been presented to and reviewed with the patient, [name].  The patient and family have been given the opportunity to ask questions and make suggestions.  Margo CommonGigi George Carol Theys 10/02/2014, 7:32 PM

## 2014-10-02 NOTE — ED Provider Notes (Signed)
-----------------------------------------   10:05 AM on 10/02/2014 -----------------------------------------   BP 169/81 mmHg  Pulse 145  Resp 20  Ht 5\' 1"  (1.549 m)  Wt 173 lb (78.472 kg)  BMI 32.70 kg/m2  SpO2 100%  Most recent documented heart rate is 145 which has currently improved to 114 without intervention. Patient is currently ambulating with a sheet over her head, behaving strangely but she is redirectable. We'll continue to monitor. Disposition is pending per Psychiatry/Behavioral Medicine team recommendations.     Gayla DossEryka A Can Lucci, MD 10/02/14 [phone]

## 2014-10-02 NOTE — [facility] Group Notes (Signed)
[facility] Group Notes:  (Nursing/MHT/Case Management/Adjunct)  Date:  10/02/2014  Time:  11:27 PM  Type of Therapy:  Group Therapy  Participation Level:  Minimal  Participation Quality:  N/A  Affect:  Flat  Cognitive:  Disorganized  Insight:  Limited  Engagement in Group:  None  Modes of Intervention:  N/A  Summary of Progress/Problems:  Veva Holesshley Imani Malynda Smolinski 10/02/2014, 11:27 PM

## 2014-10-02 NOTE — ED Notes (Signed)
Patient now yelling more consistently. Patient asked to refrain from yelling in the lobby. Patient refuses. SO unable to calm patient. LEO involved and charge nurse made aware.

## 2014-10-02 NOTE — [facility] Counselor (Signed)
[facility]ail(Wendy S.), pt. Room number has changed from [phone] to [phone].  Updated Pt. Access [employer](Renee) and pt nurse [facility](Misty G.).

## 2014-10-03 DIAGNOSIS — F312 Bipolar disorder, current episode manic severe with psychotic features: Principal | ICD-10-CM

## 2014-10-03 LAB — TSH: TSH: 1.334 u[IU]/mL (ref 0.350–4.500)

## 2014-10-03 LAB — T4, FREE: Free T4: 1.22 ng/dL — ABNORMAL HIGH (ref 0.61–1.12)

## 2014-10-03 LAB — HEMOGLOBIN A1C: Hgb A1c MFr Bld: 5.6 % (ref 4.0–6.0)

## 2014-10-03 LAB — LIPID PANEL
CHOL/HDL RATIO: 4 ratio
Cholesterol: 184 mg/dL (ref 0–200)
HDL: 46 mg/dL (ref >40)
LDL Cholesterol: 119 mg/dL — ABNORMAL HIGH (ref 0–99)
TRIGLYCERIDES: 97 mg/dL (ref <150)
VLDL: 19 mg/dL (ref 0–40)

## 2014-10-03 NOTE — Progress Notes (Signed)
Pt appears to want attention. Pt appears to try to figure out something to talk about if you are not paying attention to her.

## 2014-10-03 NOTE — Progress Notes (Signed)
Recreation Therapy Notes  Date: 06.02.16 Time: 3:30 pm Location: Craft Room  Group Topic: Leisure Education/Coping Skills  Goal Area(s) Addresses: Patient will identify things they are grateful for. Patient will identify why it is important to be grateful.   Behavioral Response: Attentive, Interactive  Intervention: Grateful Wheel  Activity: Patients were given a "I am Grateful For" worksheet and instructed to list 2-3 things they were grateful for under each category.  Education: LRT educated patients on why it is important to be grateful.   Education Outcome: Acknowledges education/In group clarification offered   Clinical Observations/Feedback: Patient participated in activity. After patient had been working on activity and writing things down, patient told LRT she was getting a headache because she could not see. LRT offered to read things out loud to patient, but patient refused. LRT told patient she could listen instead. Patient intermittently fell asleep during group. Patient did not contribute to group discussion.  Jacquelynn CreeGreene,Hanan Mcwilliams M, LRT/CTRS 10/03/2014 4:27 PM

## 2014-10-03 NOTE — Plan of Care (Signed)
Problem: Ineffective individual coping Goal: LTG: Patient will report a decrease in negative feelings Outcome: Progressing Patient states she feels better.  Goal: STG: Patient will remain free from self harm Outcome: Progressing No self-harm.

## 2014-10-03 NOTE — H&P (Signed)
Psychiatric Admission Assessment Adult  Patient Identification: [name] MRN:  [id number] Date of Evaluation:  10/03/2014 Chief Complaint:   Bipolar Principal Diagnosis: Bipolar affective disorder, manic, severe, with psychotic behavior Diagnosis:   Patient Active Problem List   Diagnosis Date Noted  . Bipolar I disorder most recent episode mania with psychosis.  [F31.2] 01/25/2007    Priority: High  . Numbness and tingling in hands [R20.2] 06/29/2012  . Hypothyroid [E03.9] 01/25/2007  . FATIGUE, CHRONIC [R53.81, R53.83] 01/25/2007   History of Present Illness::   Identifying data. [name] is a 44 year old female with history of bipolar mania  Chief complaint. "There was nothing wrong."  History of present illness. Information was obtained from the patient and the chart. The patient has a long history of bipolar. She was diagnosed at the age of 80. She she was symptom free for over 10 years but in the past 6 years every 2 years she has a manic episode. She was hospitalized to [facility] in 2014 for similar symptoms. At her boyfriend reported that a few days ago the patient stopped sleeping, was hyperactive, disorganized, bizarre. The patient tells me that there was nothing wrong except for insomnia at bats that her mother has been yelling and screaming at her and telling her to go to the hospital. Apparently the mother came down from [place]. The patient reports that for the first year following discharge in 2014 she was taking the prescribed by doctor [facility]. She stopped it when she gained weight, developed high cholesterol, but also lost her health insurance. She has been off medication for a year now. She denies symptoms of depression. She denies heightened anxiety but she worries about her spending at work she is being promoted as a Firefighter. She denies current psychotic symptoms but she seems to be a paranoid about her mother's  involvement in her hospital admission. She reports poor sleep, racing thoughts, hyperactivity, inability to focus, and some irritability. There are no substances involved.  Past psychiatric history. Diagnosed with bipolar at the age of 55. She had several manic episodes that responded well to treatment. She usually does not take any medications in between episodes. There were 2 suicide attempts even though the patient denies ever feeling depressed. She is been tried on Depakote, Zyprexa, Latuda. They all caused weight gain and are unacceptable to the patient. During her last admission she did well on a combination of Geodon and Tegretol.  Family psychiatric history. Mother with undiagnosed bipolar.  Social history. She is divorced. She lives with a very supportive boyfriend and a 77 year old son. Her two daughters are in the custody of her ex-husband. She works as a Health and safety inspector second grade. She lost her job at [employer] after her last episode. 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 She is uncertain if she wants to take this responsibility. She did not sign a contract. This could have been additional stress and causing current episode  Past medical history. Status post radioablation of her thyroid.   Total Time spent with patient: 1 hour  Past Medical History:  Past Medical History  Diagnosis Date  . Other malaise and fatigue   . Unspecified hypothyroidism   . Other bipolar disorders    History reviewed. No pertinent past surgical history. Family History:  Family History  Problem Relation Age of Onset  . Mental illness Mother   . Alzheimer's disease Maternal Grandmother   .  Heart attack Maternal Grandfather    Social History:  History  Alcohol Use No     History  Drug Use No    History   Social History  . Marital Status: Single    Spouse Name: N/A  . Number of Children: 3  . Years of Education: N/A   Occupational  History  . teacher    Social History Main Topics  . Smoking status: Never Smoker   . Smokeless tobacco: Not on file  . Alcohol Use: No  . Drug Use: No  . Sexual Activity: Not on file   Other Topics Concern  . None   Social History Narrative   Regular exercise yes      Diet: fruits and veggies   Additional Social History:                          Musculoskeletal: Strength & Muscle Tone: within normal limits Gait & Station: normal Patient leans: N/A  Psychiatric Specialty Exam: Physical Exam  Nursing note and vitals reviewed.   Review of Systems  All other systems reviewed and are negative.   Blood pressure 158/102, pulse 105, temperature 98.3 F (36.8 C), temperature source Oral, resp. rate 20, height '5\' 1"'  (1.549 m), weight 75.297 kg (166 lb), last menstrual period 09/24/2014, SpO2 99 %.Body mass index is 31.38 kg/(m^2).  See SRA.                                                  Sleep:  Number of Hours: 4.3   Risk to Self: Is patient at risk for suicide?: No Risk to Others:   Prior Inpatient Therapy:   Prior Outpatient Therapy:    Alcohol Screening: Patient refused Alcohol Screening Tool: Yes 1. How often do you have a drink containing alcohol?: Never 9. Have you or someone else been injured as a result of your drinking?: No 10. Has a relative or friend or a doctor or another health worker been concerned about your drinking or suggested you cut down?: No Alcohol Use Disorder Identification Test Final Score (AUDIT): 0  Allergies:  No Known Allergies Lab Results:  Results for orders placed or performed during the hospital encounter of 10/02/14 (from the past 48 hour(s))  Acetaminophen level     Status: Abnormal   Collection Time: 10/02/14  5:44 AM  Result Value Ref Range   Acetaminophen (Tylenol), Serum <10 (L) 10 - 30 ug/mL    Comment:        THERAPEUTIC CONCENTRATIONS VARY SIGNIFICANTLY. A RANGE OF 10-30 ug/mL MAY BE AN  EFFECTIVE CONCENTRATION FOR MANY PATIENTS. HOWEVER, SOME ARE BEST TREATED AT CONCENTRATIONS OUTSIDE THIS RANGE. ACETAMINOPHEN CONCENTRATIONS >150 ug/mL AT 4 HOURS AFTER INGESTION AND >50 ug/mL AT 12 HOURS AFTER INGESTION ARE OFTEN ASSOCIATED WITH TOXIC REACTIONS.   CBC     Status: Abnormal   Collection Time: 10/02/14  5:44 AM  Result Value Ref Range   WBC 16.5 (H) 3.6 - 11.0 K/uL   RBC 5.08 3.80 - 5.20 MIL/uL   Hemoglobin 14.9 12.0 - 16.0 g/dL   HCT 45.0 35.0 - 47.0 %   MCV 88.6 80.0 - 100.0 fL   MCH 29.3 26.0 - 34.0 pg   MCHC 33.0 32.0 - 36.0 g/dL   RDW 12.4 11.5 - 14.5 %  Platelets 410 150 - 440 K/uL  Comprehensive metabolic panel     Status: Abnormal   Collection Time: 10/02/14  5:44 AM  Result Value Ref Range   Sodium 138 135 - 145 mmol/L   Potassium 3.2 (L) 3.5 - 5.1 mmol/L   Chloride 104 101 - 111 mmol/L   CO2 22 22 - 32 mmol/L   Glucose, Bld 182 (H) 65 - 99 mg/dL   BUN 16 6 - 20 mg/dL   Creatinine, Ser 0.73 0.44 - 1.00 mg/dL   Calcium 9.2 8.9 - 10.3 mg/dL   Total Protein 8.3 (H) 6.5 - 8.1 g/dL   Albumin 4.7 3.5 - 5.0 g/dL   AST 23 15 - 41 U/L   ALT 17 14 - 54 U/L   Alkaline Phosphatase 79 38 - 126 U/L   Total Bilirubin 0.4 0.3 - 1.2 mg/dL   GFR calc non Af Amer >60 >60 mL/min   GFR calc Af Amer >60 >60 mL/min    Comment: (NOTE) The eGFR has been calculated using the CKD EPI equation. This calculation has not been validated in all clinical situations. eGFR's persistently <60 mL/min signify possible Chronic Kidney Disease.    Anion gap 12 5 - 15  Ethanol (ETOH)     Status: None   Collection Time: 10/02/14  5:44 AM  Result Value Ref Range   Alcohol, Ethyl (B) <5 <5 mg/dL    Comment:        LOWEST DETECTABLE LIMIT FOR SERUM ALCOHOL IS 11 mg/dL FOR MEDICAL PURPOSES ONLY   Salicylate level     Status: None   Collection Time: 10/02/14  5:44 AM  Result Value Ref Range   Salicylate Lvl <4.5 2.8 - 30.0 mg/dL   Current Medications: Current  Facility-Administered Medications  Medication Dose Route Frequency Provider Last Rate Last Dose  . acetaminophen (TYLENOL) tablet 650 mg  650 mg Oral Q6H PRN Madox Corkins B Janziel Hockett, MD      . alum & mag hydroxide-simeth (MAALOX/MYLANTA) 200-200-20 MG/5ML suspension 30 mL  30 mL Oral Q4H PRN Tilly Pernice B Arieonna Medine, MD      . carbamazepine (TEGRETOL) tablet 200 mg  200 mg Oral BID Clovis Fredrickson, MD   200 mg at 10/02/14 2139  . levothyroxine (SYNTHROID, LEVOTHROID) tablet 75 mcg  75 mcg Oral QAC breakfast Marlisha Vanwyk B Noreen Mackintosh, MD      . magnesium hydroxide (MILK OF MAGNESIA) suspension 30 mL  30 mL Oral Daily PRN Farah Lepak B Aristides Luckey, MD      . multivitamin with minerals tablet 1 tablet  1 tablet Oral Daily Deshana Rominger B Gilman Olazabal, MD   1 tablet at 10/02/14 1800  . temazepam (RESTORIL) capsule 15 mg  15 mg Oral QHS PRN Clovis Fredrickson, MD   15 mg at 10/02/14 2140  . ziprasidone (GEODON) capsule 40 mg  40 mg Oral BID WC Christian Borgerding B Waseem Suess, MD   40 mg at 10/02/14 1745   PTA Medications: Prescriptions prior to admission  Medication Sig Dispense Refill Last Dose  . levothyroxine (SYNTHROID, LEVOTHROID) 75 MCG tablet Take 75 mcg by mouth daily before breakfast.   Taking  . Multiple Vitamin (MULTIVITAMIN) tablet Take 1 tablet by mouth daily.   Taking  . Multiple Vitamins-Minerals (HAIR/SKIN/NAILS) TABS Take 1 tablet by mouth daily.       Previous Psychotropic Medications: Yes   Substance Abuse History in the last 12 months:  No.    Consequences of Substance Abuse: NA  Results for orders placed or  performed during the hospital encounter of 10/02/14 (from the past 72 hour(s))  Acetaminophen level     Status: Abnormal   Collection Time: 10/02/14  5:44 AM  Result Value Ref Range   Acetaminophen (Tylenol), Serum <10 (L) 10 - 30 ug/mL    Comment:        THERAPEUTIC CONCENTRATIONS VARY SIGNIFICANTLY. A RANGE OF 10-30 ug/mL MAY BE AN EFFECTIVE CONCENTRATION FOR MANY PATIENTS. HOWEVER,  SOME ARE BEST TREATED AT CONCENTRATIONS OUTSIDE THIS RANGE. ACETAMINOPHEN CONCENTRATIONS >150 ug/mL AT 4 HOURS AFTER INGESTION AND >50 ug/mL AT 12 HOURS AFTER INGESTION ARE OFTEN ASSOCIATED WITH TOXIC REACTIONS.   CBC     Status: Abnormal   Collection Time: 10/02/14  5:44 AM  Result Value Ref Range   WBC 16.5 (H) 3.6 - 11.0 K/uL   RBC 5.08 3.80 - 5.20 MIL/uL   Hemoglobin 14.9 12.0 - 16.0 g/dL   HCT 45.0 35.0 - 47.0 %   MCV 88.6 80.0 - 100.0 fL   MCH 29.3 26.0 - 34.0 pg   MCHC 33.0 32.0 - 36.0 g/dL   RDW 12.4 11.5 - 14.5 %   Platelets 410 150 - 440 K/uL  Comprehensive metabolic panel     Status: Abnormal   Collection Time: 10/02/14  5:44 AM  Result Value Ref Range   Sodium 138 135 - 145 mmol/L   Potassium 3.2 (L) 3.5 - 5.1 mmol/L   Chloride 104 101 - 111 mmol/L   CO2 22 22 - 32 mmol/L   Glucose, Bld 182 (H) 65 - 99 mg/dL   BUN 16 6 - 20 mg/dL   Creatinine, Ser 0.73 0.44 - 1.00 mg/dL   Calcium 9.2 8.9 - 10.3 mg/dL   Total Protein 8.3 (H) 6.5 - 8.1 g/dL   Albumin 4.7 3.5 - 5.0 g/dL   AST 23 15 - 41 U/L   ALT 17 14 - 54 U/L   Alkaline Phosphatase 79 38 - 126 U/L   Total Bilirubin 0.4 0.3 - 1.2 mg/dL   GFR calc non Af Amer >60 >60 mL/min   GFR calc Af Amer >60 >60 mL/min    Comment: (NOTE) The eGFR has been calculated using the CKD EPI equation. This calculation has not been validated in all clinical situations. eGFR's persistently <60 mL/min signify possible Chronic Kidney Disease.    Anion gap 12 5 - 15  Ethanol (ETOH)     Status: None   Collection Time: 10/02/14  5:44 AM  Result Value Ref Range   Alcohol, Ethyl (B) <5 <5 mg/dL    Comment:        LOWEST DETECTABLE LIMIT FOR SERUM ALCOHOL IS 11 mg/dL FOR MEDICAL PURPOSES ONLY   Salicylate level     Status: None   Collection Time: 10/02/14  5:44 AM  Result Value Ref Range   Salicylate Lvl <0.0 2.8 - 30.0 mg/dL    Observation Level/Precautions:  15 minute checks  Laboratory:  CBC Chemistry  Profile HbAIC UDS UA  Psychotherapy:    Medications:    Consultations:    Discharge Concerns:    Estimated LOS:  Other:     Psychological Evaluations: No   Treatment Plan Summary: Daily contact with patient to assess and evaluate symptoms and progress in treatment and Medication management  Medical Decision Making:  New problem, with additional work up planned, Review of Psycho-Social Stressors (1), Review or order clinical lab tests (1), Review of Medication Regimen & Side Effects (2) and Review of New Medication or  Change in Dosage (2)   [name] is a 44 year old female with history of bipolar disorder admitted in a manic, psychotic or possibly mixed episode in the context of medication noncompliance.  1. Mood and psychosis. The patient was restarted on Tegretol 200 mg twice daily and Geodon 40 mg twice daily for mood stabilization. She did well on this regimen in the past. Most recently a year ago she was taking [place]. She gained a lot of weight, and developed a high cholesterol. She stopped taking medication. We recheck lipid profile and hemoglobin A1c.  2. Insomnia. We started temazepam 15 mg at bedtime.  3. Hypothyroidism. The patient is post thyroid radioablation. She has not been taking any Synthroid lately and does not have a primary provider. We will check a TSH and free T4. We started Synthroid.  4. Social. The patient is seems still somewhat disorganized. She tells me that her mother took her 32 year old son and her boyfriend to [place]. 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 She is worried about that. Social worker to help her call her principal.  5. His position. She will be discharged to home. She will follow up with Dr. Gretel Acre.   I certify that inpatient services furnished can reasonably be expected to improve the patient's condition.   Khalia Gong 6/2/20168:36 AM

## 2014-10-03 NOTE — [facility] Group Notes (Signed)
[facility] Group Notes:  (Nursing/MHT/Case Management/Adjunct)  Date:  10/03/2014  Time:  2:12 PM  Type of Therapy:  Group Therapy  Participation Level:  None  Participation Quality:  n/a  Affect:  n/a  Cognitive:  n/a  Insight:  None  Engagement in Group:  n/a  Modes of Intervention:  n/a  Summary of Progress/Problems:  Mickey Farberamela M Moses Ellison 10/03/2014, 2:12 PM

## 2014-10-03 NOTE — [facility] Suicide Risk Assessment (Signed)
[facility]   Nursing information obtained from:    Demographic factors:    Current Mental Status:  NA Loss Factors:  NA Historical Factors:    Risk Reduction Factors:  Employed Total Time spent with patient: 1 hour Principal Problem: Bipolar affective disorder, manic, severe, with psychotic behavior Diagnosis:   Patient Active Problem List   Diagnosis Date Noted  . Bipolar I disorder most recent episode mania with psychosis.  [F31.2] 01/25/2007    Priority: High  . Numbness and tingling in hands [R20.2] 06/29/2012  . Hypothyroid [E03.9] 01/25/2007  . FATIGUE, CHRONIC [R53.81, R53.83] 01/25/2007     Continued Clinical Symptoms:  Alcohol Use Disorder Identification Test Final Score (AUDIT): 0 The "Alcohol Use Disorders Identification Test", Guidelines for Use in Primary Care, Second Edition.  World Science writerHealth Organization [facility](WHO). Score between 0-7:  no or low risk or alcohol related problems. Score between 8-15:  moderate risk of alcohol related problems. Score between 16-19:  high risk of alcohol related problems. Score 20 or above:  warrants further diagnostic evaluation for alcohol dependence and treatment.   CLINICAL FACTORS:   Bipolar Disorder:   Mixed State   Musculoskeletal: Strength & Muscle Tone: within normal limits Gait & Station: normal Patient leans: N/A  Psychiatric Specialty Exam: Physical Exam  Nursing note and vitals reviewed. Constitutional: She is oriented to person, place, and time. She appears well-developed and well-nourished.  HENT:  Head: Normocephalic and atraumatic.  Eyes: Conjunctivae and EOM are normal. Pupils are equal, round, and reactive to light.  Neck: Normal range of motion. Neck supple.  Cardiovascular: Normal rate and regular rhythm.   Respiratory: Effort normal and breath sounds normal.  GI: Soft. Bowel sounds are normal.  Musculoskeletal: Normal range of motion.  Neurological: She is alert and oriented to  person, place, and time.  Skin: Skin is warm and dry.    Review of Systems  All other systems reviewed and are negative.   Blood pressure 158/102, pulse 105, temperature 98.3 F (36.8 C), temperature source Oral, resp. rate 20, height 5\' 1"  (1.549 m), weight 75.297 kg (166 lb), last menstrual period 09/24/2014, SpO2 99 %.Body mass index is 31.38 kg/(m^2).  General Appearance: Disheveled  Eye Contact::  Poor  Speech:  Slow  Volume:  Decreased  Mood:  Dysphoric  Affect:  Tearful  Thought Process:  Goal Directed, Linear and Logical  Orientation:  Full (Time, Place, and Person)  Thought Content:  Paranoid Ideation  Suicidal Thoughts:  No  Homicidal Thoughts:  No  Memory:  Immediate;   Fair Recent;   Fair Remote;   Fair  Judgement:  Impaired  Insight:  Present  Psychomotor Activity:  Decreased  Concentration:  Fair  Recall:  FiservFair  Fund of Knowledge:Fair  Language: Fair  Akathisia:  No  Handed:  Right  AIMS (if indicated):     Assets:  Communication Skills Desire for Improvement Financial Resources/Insurance Housing Intimacy Physical Health Resilience Social Support Talents/Skills Transportation Vocational/Educational  Sleep:  Number of Hours: 4.3  Cognition: WNL  ADL's:  Intact     COGNITIVE FEATURES THAT CONTRIBUTE TO RISK:  None    SUICIDE RISK:   Moderate:  Frequent suicidal ideation with limited intensity, and duration, some specificity in terms of plans, no associated intent, good self-control, limited dysphoria/symptomatology, some risk factors present, and identifiable protective factors, including available and accessible social support.  PLAN OF CARE: Hospital admission, medication management, discharge planning.  Medical Decision Making:  New problem,  with additional work up planned, Review of Psycho-Social Stressors (1), Review or order clinical lab tests (1), Review of Medication Regimen & Side Effects (2) and Review of New Medication or Change in  Dosage (2)   Ms. Cattouse is a 44 year old female with history of bipolar disorder admitted in a manic, psychotic or possibly mixed episode in the context of medication noncompliance.  1. Mood and psychosis. The patient was restarted on Tegretol 200 mg twice daily and Geodon 40 mg twice daily for mood stabilization. She did well on this regimen in the past. Most recently a year ago she was taking [place]. She gained a lot of weight, and developed a high cholesterol. She stopped taking medication. We recheck lipid profile and hemoglobin A1c.  2. Insomnia. We started temazepam 15 mg at bedtime.  3. Hypothyroidism. The patient is post thyroid radioablation. She has not been taking any Synthroid lately and does not have a primary provider. We will check a TSH and free T4. We started Synthroid.  4. Social. The patient is seems still somewhat disorganized. She tells me that her mother took her 60 year old son and her boyfriend to [place]. 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 She is worried about that. Social worker to help her call her principal.  5. His position. She will be discharged to home. She will follow up with Dr. Garnetta Buddy.  I certify that inpatient services furnished can reasonably be expected to improve the patient's condition.   Maahir Horst 10/03/2014, 8:27 AM

## 2014-10-03 NOTE — Progress Notes (Signed)
Recreation Therapy Notes  INPATIENT RECREATION THERAPY ASSESSMENT  Patient Details Name: [name] MRN: [id number] DOB: [birth year] Today's Date: 10/03/2014  Patient Stressors: Family, Work  Coping Skills:   Isolate, Avoidance, Exercise, Art/Dance, Talking, Music, Other (Comment) (Deep breathing, relaxation, yoga)  Personal Challenges: Problem-Solving, Relationships, Self-Esteem/Confidence, Social Interaction, Time Management, Trusting Others  Leisure Interests (2+):  Individual - Other (Comment) (Dance with kids, sing)  Awareness of Community Resources:  Yes  Community Resources:  Research scientist (physical sciences)Movie Theaters, Tree surgeonMall, Other (Comment) Higher education careers adviser(Theater group)  Current Use: No  If no, Barriers?: Financial, Other (Comment) (Time)  Patient Strengths:  Funny, smart  Patient Identified Areas of Improvement:  Weight and organization  Current Recreation Participation:  Watching TV  Patient Goal for Hospitalization:  To get out of the hospital as soon as possible so she can collect her girls on the weekend.  [place]  County [place] Residence:  [place]   Current [place] (including self-harm):  No  Current HI:  No  Consent to Intern Participation: N/A   Jacquelynn CreeGreene,Nadiah Corbit M, LRT/CTRS 10/03/2014, 12:50 PM

## 2014-10-03 NOTE — Tx Team (Signed)
Interdisciplinary Treatment Plan Update (Adult)  Date:  10/03/2014 Time Reviewed:  3:51 PM  Progress in Treatment: Attending groups: No. Participating in groups:  No. and As evidenced by:  patient too acute for group at this time Taking medication as prescribed:  Yes. Tolerating medication:  Yes. Family/Significant othe contact made:  No, will contact:  family if patient provides consent Patient understands diagnosis:  Yes. Discussing patient identified problems/goals with staff:  Yes. Medical problems stabilized or resolved:  Yes. Denies suicidal/homicidal ideation: Yes. Issues/concerns per patient self-inventory:  No. Other:  New problem(s) identified: No, Describe:  none reported  Discharge Plan or Barriers: Patient may return home and will need followup with her psychiatrist before discharge  Reason for Continuation of Hospitalization: Mania  Comments:  Estimated length of stay: up to 4 days  New goal(s):  Review of initial/current patient goals per problem list:   See Care Plan  Attendees: Patient:   6/2/20163:51 PM  Family:   6/2/20163:51 PM  Physician:  Kristine LineaJolanta Pucilowska, MD 6/2/20163:51 PM  Nursing:    6/2/20163:51 PM  Case Manager:   6/2/20163:51 PM  Counselor:   6/2/20163:51 PM  Other:  Beryl MeagerJason Ailah Barna, LCSWA 6/2/20163:51 PM  Other:  Wilford Gristara Mitchell, LCSW 6/2/20163:51 PM  Other:  Jake SharkSara Laws, LCSW 6/2/20163:51 PM  Other: Hershal CoriaBeth Greene, LRT 6/2/20163:51 PM  Other: Carola FrostLee Ann Roush, Psych D 6/2/20163:51 PM  Other:  6/2/20163:51 PM  Other:  6/2/20163:51 PM  Other:  6/2/20163:51 PM  Other:  6/2/20163:51 PM  Other:   6/2/20163:51 PM   Scribe for Treatment Team:   Beryl MeagerIngle, Claudell Rhody T, 10/03/2014, 3:51 PM

## 2014-10-03 NOTE — Progress Notes (Signed)
Pt continued to pace the hall , pt exhibited crying spells at times and was trying to walk around with her eyes closed and when she was told not to do it she appeared to stop.

## 2014-10-03 NOTE — Plan of Care (Signed)
Problem: Diagnosis: Increased Risk For Suicide Attempt Goal: STG-Patient Will Comply With Medication Regime Outcome: Progressing Patient compliant with meds but says she is not going to take anything new. She looks carefully at pills to identify them before taking them.

## 2014-10-03 NOTE — Plan of Care (Signed)
Problem: Ineffective individual coping Goal: STG:Pt. will utilize relaxation techniques to reduce stress STG: Patient will utilize relaxation techniques to reduce stress levels  Outcome: Not Progressing Pt continues to appear stressed and needs constant re-direction  Problem: Alteration in thought process Goal: LTG-Patient behavior demonstrates decreased signs psychosis (Patient behavior demonstrates decreased signs of psychosis to the point the patient is safe to return home and continue treatment in an outpatient setting.)  Outcome: Not Progressing Pt continues to be responding, has flight of ideas, loose associations and tangential speech Goal: STG-Patient is able to follow short directions Outcome: Progressing Pt is re-directable Goal: STG-Patient is able to sleep at least 6 hours per night Outcome: Not Progressing Pt paced the floor and continued to get up throughout the night, needing constant re-direction.

## 2014-10-03 NOTE — Progress Notes (Signed)
Patient is more alert today than yesterday. Her speech is rambling and circumstantial but not pressured. Affect flat and sad. She is concerned that all of her belongings were sent home when she was admitted in the ER. Patient denies SI. Denies AVH. Continue to monitor.

## 2014-10-03 NOTE — Progress Notes (Signed)
D: Pt denies SI/HI/AVH. Pt is responding AVH, by pt comments. Pt will say she is looking for someone or someone told her to do something. Pt continues to be disorganized, loose associations, flight of ideas and tangential. Pt wanted a map of the unit so she would not get lost . Unit was explained to pt and pt was shown the two halls she was allowed to go down. "I just want to relax, I don't care, I just let the cat out of the bag". Pt appears disorganized at times but then makes references to things that make sense.  A: Pt was offered support and encouragement. Pt was given scheduled medications. Pt was encourage to attend groups. Q 15 minute checks were done for safety.   R: Pt is taking medication.Pt receptive to treatment and safety maintained on unit.

## 2014-10-03 NOTE — Progress Notes (Signed)
Pt appears to be disorganized at times and appears to know what's going on others. Pt was explained that she can only go down green hall and hall to dayroom that she should not be going down anyother hallways. Pt was observed going down yellow hallway with another pt and writer called pt name , pt responded "OH, I can only go down green hallway"

## 2014-10-04 MED ORDER — MENTHOL 3 MG MT LOZG
1.0000 | LOZENGE | OROMUCOSAL | Status: DC | PRN
  Filled 2014-10-04: qty 9

## 2014-10-04 MED ORDER — DIPHENHYDRAMINE HCL 25 MG PO CAPS
50.0000 mg | ORAL_CAPSULE | Freq: Every evening | ORAL | Status: DC | PRN
  Administered 2014-10-04 – 2014-10-05 (×2): 50 mg via ORAL
  Filled 2014-10-04 (×2): qty 2

## 2014-10-04 MED ORDER — ZIPRASIDONE HCL 20 MG PO CAPS
60.0000 mg | ORAL_CAPSULE | Freq: Two times a day (BID) | ORAL | Status: DC
  Administered 2014-10-04 – 2014-10-07 (×6): 60 mg via ORAL
  Filled 2014-10-04 (×7): qty 3

## 2014-10-04 NOTE — Plan of Care (Signed)
Problem: [facility] Participation in Recreation Therapeutic Interventions Goal: STG-Patient will demonstrate improved self esteem by identif STG: Self-Esteem - Within 5 treatment sessions, patient will verbalize at least 5 positive affirmation statements in each of 3 treatment sessions to increase self-esteem post d/c.  Outcome: Progressing Treatment Session 1; Completed 1 out of 3: At approximately 11:30 am, LRT met with patient in patient room. Patient verbalized 5 positive affirmation statements. Patient reported it felt "good". LRT encouraged patient to continue saying positive affirmation statements.  Leonette Monarch, LRT/CTRS 06.03.16 1:01 pm

## 2014-10-04 NOTE — Progress Notes (Signed)
[facility]  10/04/2014 12:50 PM [name]  MRN:  [id number]  Subjective:  Ms. [name] reports feeling tired and somewhat sedated from Geodon but her thoughts are much better organized and her mood more stable. She is much better groomed. She is able to participate in the interview. She spoke with the her school principal and will likely have a meeting with her next week. She is ready to increase Geodon slightly. As she slept well. Appetite is fair. There are no frank psychotic symptoms. She was very disorganized and paranoid and hallucinating initially. She denies depressive symptoms or excessive anxiety. She participates in groups. She spoke with her boyfriend who is back in town from [place]. When she leaves the hospital she will have necessary support. .  Principal Problem: Bipolar affective disorder, manic, severe, with psychotic behavior Diagnosis:   Patient Active Problem List   Diagnosis Date Noted  . Bipolar I disorder most recent episode mania with psychosis.  [F31.2] 01/25/2007    Priority: High  . Numbness and tingling in hands [R20.2] 06/29/2012  . Hypothyroid [E03.9] 01/25/2007  . FATIGUE, CHRONIC [R53.81, R53.83] 01/25/2007   Total Time spent with patient: 20 minutes   Past Medical History:  Past Medical History  Diagnosis Date  . Other malaise and fatigue   . Unspecified hypothyroidism   . Other bipolar disorders    History reviewed. No pertinent past surgical history. Family History:  Family History  Problem Relation Age of Onset  . Mental illness Mother   . Alzheimer's disease Maternal Grandmother   . Heart attack Maternal Grandfather    Social History:  History  Alcohol Use No     History  Drug Use No    History   Social History  . Marital Status: Single    Spouse Name: N/A  . Number of Children: 3  . Years of Education: N/A   Occupational History  . teacher    Social History Main Topics  . Smoking status: Never Smoker    . Smokeless tobacco: Not on file  . Alcohol Use: No  . Drug Use: No  . Sexual Activity: Not on file   Other Topics Concern  . None   Social History Narrative   Regular exercise yes      Diet: fruits and veggies   Additional History:    Sleep: Good  Appetite:  Good   Assessment:   Musculoskeletal: Strength & Muscle Tone: within normal limits Gait & Station: normal Patient leans: N/A   Psychiatric Specialty Exam: Physical Exam  Nursing note and vitals reviewed.   Review of Systems  All other systems reviewed and are negative.   Blood pressure 120/83, pulse 92, temperature 98.6 F (37 C), temperature source Oral, resp. rate 20, height 5\' 1"  (1.549 m), weight 75.297 kg (166 lb), last menstrual period 09/24/2014, SpO2 99 %.Body mass index is 31.38 kg/(m^2).  General Appearance: Casual  Eye Contact::  Good  Speech:  Clear and Coherent  Volume:  Normal  Mood:  Euphoric  Affect:  Congruent  Thought Process:  Goal Directed  Orientation:  Full (Time, Place, and Person)  Thought Content:  WDL  Suicidal Thoughts:  No  Homicidal Thoughts:  No  Memory:  Immediate;   Fair Recent;   Fair Remote;   Fair  Judgement:  Fair  Insight:  Fair  Psychomotor Activity:  Normal  Concentration:  Fair  Recall:  Fis[employer]Fair  Fund of Knowledge:Fair  Language: Fair  Akathisia:  No  Handed:  Right  AIMS (if indicated):     Assets:  Communication Skills Desire for Improvement Financial Resources/Insurance Housing Intimacy Physical Health Resilience Social Support Talents/Skills Transportation Vocational/Educational  ADL's:  Intact  Cognition: WNL  Sleep:  Number of Hours: 5     Current Medications: Current Facility-Administered Medications  Medication Dose Route Frequency Provider Last Rate Last Dose  . acetaminophen (TYLENOL) tablet 650 mg  650 mg Oral Q6H PRN Shari Prows, MD   650 mg at 10/04/14 0325  . alum & mag hydroxide-simeth (MAALOX/MYLANTA) 200-200-20  MG/5ML suspension 30 mL  30 mL Oral Q4H PRN Jolanta B Pucilowska, MD      . carbamazepine (TEGRETOL) tablet 200 mg  200 mg Oral BID Shari Prows, MD   200 mg at 10/04/14 0742  . levothyroxine (SYNTHROID, LEVOTHROID) tablet 75 mcg  75 mcg Oral QAC breakfast Shari Prows, MD   75 mcg at 10/04/14 0742  . magnesium hydroxide (MILK OF MAGNESIA) suspension 30 mL  30 mL Oral Daily PRN Shari Prows, MD   30 mL at 10/04/14 0417  . multivitamin with minerals tablet 1 tablet  1 tablet Oral Daily Shari Prows, MD   1 tablet at 10/04/14 0916  . temazepam (RESTORIL) capsule 15 mg  15 mg Oral QHS PRN Shari Prows, MD   15 mg at 10/02/14 2140  . ziprasidone (GEODON) capsule 40 mg  40 mg Oral BID WC Shari Prows, MD   40 mg at 10/04/14 1610    Lab Results:  Results for orders placed or performed during the hospital encounter of 10/02/14 (from the past 48 hour(s))  TSH     Status: None   Collection Time: 10/03/14  7:06 AM  Result Value Ref Range   TSH 1.334 0.350 - 4.500 uIU/mL  T4, free     Status: Abnormal   Collection Time: 10/03/14  7:06 AM  Result Value Ref Range   Free T4 1.22 (H) 0.61 - 1.12 ng/dL  Lipid panel     Status: Abnormal   Collection Time: 10/03/14  7:06 AM  Result Value Ref Range   Cholesterol 184 0 - 200 mg/dL   Triglycerides 97 <960 mg/dL   HDL 46 >45 mg/dL   Total CHOL/HDL Ratio 4.0 RATIO   VLDL 19 0 - 40 mg/dL   LDL Cholesterol 409 (H) 0 - 99 mg/dL    Comment:        Total Cholesterol/HDL:CHD Risk Coronary Heart Disease Risk Table                     Men   Women  1/2 Average Risk   3.4   3.3  Average Risk       5.0   4.4  2 X Average Risk   9.6   7.1  3 X Average Risk  23.4   11.0        Use the calculated Patient Ratio above and the CHD Risk Table to determine the patient's CHD Risk.        ATP III CLASSIFICATION (LDL):  <100     mg/dL   Optimal  811-914  mg/dL   Near or Above                    Optimal  130-159  mg/dL    Borderline  782-956  mg/dL   High  >213     mg/dL   Very  High   Hemoglobin A1c     Status: None   Collection Time: 10/03/14  7:06 AM  Result Value Ref Range   Hgb A1c MFr Bld 5.6 4.0 - 6.0 %    Physical Findings: AIMS:  , ,  ,  ,    CIWA:    COWS:     Treatment Plan Summary: Daily contact with patient to assess and evaluate symptoms and progress in treatment and Medication management   Medical Decision Making:  Established Problem, Stable/Improving (1), Review of Psycho-Social Stressors (1), Review or order clinical lab tests (1), Review of Medication Regimen & Side Effects (2) and Review of New Medication or Change in Dosage (2)   Ms. Cattouse is a 44 year old female with history of bipolar disorder admitted in a manic, psychotic or possibly mixed episode in the context of medication noncompliance.  1. Mood and psychosis. The patient was restarted on Tegretol 200 mg twice daily and Geodon 40 mg twice daily for mood stabilization. She did well on this regimen in the past. Most recently a year ago she was taking Jordan. She gained a lot of weight, and developed a high cholesterol. She stopped taking medication. Lpiid profile and hemoglobin A1c WNL.  2. Insomnia. We started temazepam 15 mg at bedtime.  3. Hypothyroidism. The patient is post thyroid radioablation. She has not been taking any Synthroid lately and does not have a primary provider. TSH and free T4 are normal. Will stop Synthroid.  4. Social. The patient is seems still somewhat disorganized. She tells me that her mother took her 52 year old son and her boyfriend to [place]. 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 She is worried about that. Social worker to help her call her principal.  5. His position. She will be discharged to home. She will follow up with Dr. Garnetta Buddy or [facility] where she used to take her son.      Jolanta Pucilowska 10/04/2014, 12:50 PM

## 2014-10-04 NOTE — [facility] Group Notes (Signed)
[facility]  10/04/2014 11:32 AM  Participation Quality:  Drowsy  Affect:  Flat  Cognitive:  Appropriate  Insight:  Lacking  Engagement in Group:  Limited  Modes of Intervention:  Exploration and Socialization  Summary of Progress/Problems: Patient attended and participated in group discussion but had her eyes closed much of the time. 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 Patient shared that her SMART goal is to "follow through with medical staff and function at the highest level possible".   [name], Tilley Faeth T 10/04/2014, 11:32 AM

## 2014-10-04 NOTE — Progress Notes (Signed)
D:  Per pt self inventory pt reports sleeping fair, appetite good, energy level normal, ability to pay attention good, rates depression at a 0 out of 10, hopelessness at a 0 out of 10, denies anxiety, denies SI/HI/AVH, animated and high energy at times, suspicious with medications.      A:  Emotional support provided, Encouraged pt to continue with treatment plan and attend all group activities, q15 min checks maintained for safety.  R:  Pt is going to groups, interacts with other patients on the unit, intrusive at times, and singing out to other patients at times.

## 2014-10-04 NOTE — [facility] Group Notes (Signed)
[facility] Group Notes:  (Nursing/MHT/Case Management/Adjunct)  Date:  10/04/2014  Time:  1:28 AM  Type of Therapy:  Group Therapy  Participation Level:  Did Not Attend  Participation Quality:  n/a  Affect:  n/a  Cognitive:  n/a  Insight:  None  Engagement in Group:  n/a  Modes of Intervention:  n/a  Summary of Progress/Problems:  [name] 10/04/2014, 1:28 AM

## 2014-10-04 NOTE — Progress Notes (Signed)
Recreation Therapy Notes  Date: 06.03.16 Time: 3:00 pm Location: Craft Room  Group Topic: Self-expression/Coping skills  Goal Area(s) Addresses:  Patient will effectively use art as a means of self-expression. Patient will recognize positive benefit of self-expression. Patient will be able to identify one emotion experienced during group session. Patient will identify use of art as a coping skill.  Behavioral Response: Intermittently attentive, drowsy   Intervention: Two Faces of Me  Activity: Patients were given a blank face worksheet and instructed to draw a line down the middle. On one side of the face, patients were instructed to draw or write how they felt when they were admitted. On the other side of the face, patient were instructed to draw or write how they want to feel when they are d/c.  Education: LRT educated patients on how art is an important coping skill.   Education Outcome: Acknowledges education/In group clarification offered  Clinical Observations/Feedback: Patient started working on activity. Patient intermittently falling asleep during group. Patient did not contribute to group discussion.  Jacquelynn CreeGreene,Evalynne Locurto M, LRT/CTRS 10/04/2014 4:23 PM

## 2014-10-05 ENCOUNTER — Encounter: Payer: Self-pay | Admitting: Psychiatry

## 2014-10-05 NOTE — Progress Notes (Signed)
D) Patient pleasant and cooperative upon my assessment. Patient  completed Self Inventory Assessment and rates her anxiety 0/10 and depression 0/10.  Patient denies SI/HI, denies A/V hallucinations. Patient's affect is flat and mood is sad and depressed. Reports that her appetite is good.   A) Patient offered support and encouragement, patient encouraged to discuss feelings/concerns with staff. Patient verbalized understanding. Patient monitored Q15 minutes for safety. Patient met with MD to discuss today's goals and plan of care.  R) Patient visible in milieu, Patient appropriate with staff and peers. Patient taking medications as ordered. Will continue to monitor.
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
## 2014-10-05 NOTE — Plan of Care (Signed)
Problem: Ineffective individual coping Goal: LTG: Patient will report a decrease in negative feelings Outcome: Progressing Reports she feels better. Goal: STG: Patient will remain free from self harm Outcome: Progressing No self harm. Goal: STG:Pt. will utilize relaxation techniques to reduce stress STG: Patient will utilize relaxation techniques to reduce stress levels  Outcome: Progressing Trying to sleep during appropriate sleep hours.
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
## 2014-10-05 NOTE — [facility] Group Notes (Signed)
[facility] Group Notes:  (Nursing/MHT/Case Management/Adjunct)  Date:  10/05/2014  Time:  10:43 AM  Type of Therapy:  Community Meeting   Participation Level:  Minimal  Participation Quality:  Drowsy and Flat   Affect:  Flat  Cognitive:  Disorganized and Confused  Insight:  Lacking  Engagement in Group:  Limited and Off Topic  Modes of Intervention:  Discussion  Summary of Progress/Problems:  Lory Galan De'Chelle Krystena Reitter 10/05/2014, 10:43 AM

## 2014-10-06 LAB — CBC
HCT: 41.9 % (ref 35.0–47.0)
Hemoglobin: 13.6 g/dL (ref 12.0–16.0)
MCH: 29.1 pg (ref 26.0–34.0)
MCHC: 32.5 g/dL (ref 32.0–36.0)
MCV: 89.4 fL (ref 80.0–100.0)
PLATELETS: 296 10*3/uL (ref 150–440)
RBC: 4.69 MIL/uL (ref 3.80–5.20)
RDW: 12.4 % (ref 11.5–14.5)
WBC: 6.7 10*3/uL (ref 3.6–11.0)

## 2014-10-06 NOTE — Plan of Care (Signed)
Problem: Ineffective individual coping Goal: LTG: Patient will report a decrease in negative feelings Outcome: Progressing Patient notes she is feeling more "like herself".  Goal: STG: Patient will remain free from self harm Outcome: Progressing No self harm. Goal: STG-Increase in ability to manage activities of daily living Outcome: Progressing Patient able to complete ADL's  Independently.
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
## 2014-10-06 NOTE — Plan of Care (Signed)
Problem: Alteration in thought process Goal: LTG-Patient verbalizes understanding importance med regimen (Patient verbalizes understanding of importance of medication regimen and need to continue outpatient care.)  Outcome: Progressing Pt med compliant.     

## 2014-10-06 NOTE — Progress Notes (Signed)
Pt visible on the unit. In good spirits. Interacting with peers and staff. Med compliant. Hoping to go home Monday. Denies SI/HI/AVH
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
## 2014-10-06 NOTE — [facility] Group Notes (Signed)
[facility] LCSW Group Therapy  10/06/2014 3:34 PM  Type of Therapy:  Group Therapy  Participation Level:  Active  Participation Quality:  Appropriate and Attentive  Affect:  Appropriate  Cognitive:  Alert, Appropriate and Oriented  Insight:  Engaged  Engagement in Therapy:  Engaged  Modes of Intervention:  Exploration, Socialization and Support  Summary of Progress/Problems: Patient attended group and participated appropriately. Patient shared her thoughts and experiences on feelings around trust and honest or lack of with family and friends.  Beryl MeagerIngle, Jamisen Hawes T 10/06/2014, 3:34 PM

## 2014-10-07 LAB — BASIC METABOLIC PANEL
Anion gap: 7 (ref 5–15)
BUN: 17 mg/dL (ref 6–20)
CO2: 26 mmol/L (ref 22–32)
Calcium: 9 mg/dL (ref 8.9–10.3)
Chloride: 104 mmol/L (ref 101–111)
Creatinine, Ser: 0.87 mg/dL (ref 0.44–1.00)
GFR calc Af Amer: >60 mL/min (ref >60)
GFR calc non Af Amer: >60 mL/min (ref >60)
GLUCOSE: 110 mg/dL — AB (ref 65–99)
Potassium: 4.1 mmol/L (ref 3.5–5.1)
Sodium: 137 mmol/L (ref 135–145)

## 2014-10-07 LAB — CARBAMAZEPINE LEVEL, TOTAL: Carbamazepine Lvl: 10.2 ug/mL (ref 4.0–12.0)

## 2014-10-07 LAB — CBC WITH DIFFERENTIAL/PLATELET
BASOS ABS: 0 10*3/uL (ref 0–0.1)
Basophils Relative: 0 %
EOS ABS: 0.2 10*3/uL (ref 0–0.7)
Eosinophils Relative: 2 %
HCT: 42.1 % (ref 35.0–47.0)
Hemoglobin: 13.9 g/dL (ref 12.0–16.0)
LYMPHS ABS: 2.4 10*3/uL (ref 1.0–3.6)
Lymphocytes Relative: 28 %
MCH: 29.1 pg (ref 26.0–34.0)
MCHC: 33 g/dL (ref 32.0–36.0)
MCV: 88.2 fL (ref 80.0–100.0)
MONO ABS: 0.8 10*3/uL (ref 0.2–0.9)
Monocytes Relative: 9 %
Neutro Abs: 5.1 10*3/uL (ref 1.4–6.5)
Neutrophils Relative %: 61 %
PLATELETS: 319 10*3/uL (ref 150–440)
RBC: 4.77 MIL/uL (ref 3.80–5.20)
RDW: 12.6 % (ref 11.5–14.5)
WBC: 8.5 10*3/uL (ref 3.6–11.0)

## 2014-10-07 MED ORDER — HAIR/SKIN/NAILS PO TABS: 1.0000 | ORAL_TABLET | Freq: Every day | ORAL | Status: DC

## 2014-10-07 MED ORDER — TEMAZEPAM 15 MG PO CAPS: 15.0000 mg | ORAL_CAPSULE | Freq: Every evening | ORAL | Status: DC | PRN

## 2014-10-07 MED ORDER — CARBAMAZEPINE 200 MG PO TABS: 200.0000 mg | ORAL_TABLET | Freq: Two times a day (BID) | ORAL | Status: DC

## 2014-10-07 MED ORDER — ZIPRASIDONE HCL 60 MG PO CAPS: 60.0000 mg | ORAL_CAPSULE | Freq: Two times a day (BID) | ORAL | Status: DC

## 2014-10-07 NOTE — [facility] Group Notes (Signed)
[facility] Group Notes:  (Nursing/MHT/Case Management/Adjunct)  Date:  10/07/2014  Time:  4:00 AM  Type of Therapy:  Group Therapy  Participation Level:  Active  Participation Quality:  Drowsy  Affect:  Lethargic  Cognitive:  Appropriate  Insight:  Improving  Engagement in Group:  Engaged  Modes of Intervention:  n/a  Summary of Progress/Problems:  [name] 10/07/2014, 4:00 AM

## 2014-10-07 NOTE — Progress Notes (Signed)
Patient aware of discharge this shift . Patient denies suicidal ideation and homicidal ideations. Patient received all belongings. Patient returning home . Writer reviewed  Discharge instructions , patient verbalize understanding . Patient received . Prescriptions. And letter for work . Patient voice no other concerns left with boyfriend.

## 2014-10-07 NOTE — Progress Notes (Signed)
Recreation Therapy Notes  INPATIENT RECREATION TR PLAN  Patient Details Name: [name] MRN: [id number] DOB: 1970/09/18 Today's Date: 10/07/2014  Rec Therapy Plan Is patient appropriate for Therapeutic Recreation?: Yes Treatment times per week: At least 3 times a week TR Treatment/Interventions: 1:1 session, Group participation (Comment) (Appropriate participation in daily recreation therapy tx)  Discharge Criteria Pt will be discharged from therapy if:: Discharged Treatment plan/goals/alternatives discussed and agreed upon by:: Patient/family  Discharge Summary Short term goals set: See Care Plan Short term goals met: Adequate for discharge Progress toward goals comments: One-to-one attended Which groups?: Coping skills, Other (Comment), Leisure education (Self-expression) One-to-one attended: Self-esteem Reason goals not met: Patient d/cbefore goal could be met Therapeutic equipment acquired: None Reason patient discharged from therapy: Discharge from hospital Pt/family agrees with progress & goals achieved: Yes Date patient discharged from therapy: 10/07/14   Leonette Monarch, LRT/CTRS 10/07/2014, 1:24 PM

## 2014-10-07 NOTE — [facility] Group Notes (Signed)
[facility] Group Notes:  (Nursing/MHT/Case Management/Adjunct)  Date:  10/07/2014  Time:  11:57 AM  Type of Therapy:  Group Therapy  Participation Level:  Minimal  Participation Quality:  Drowsy  Affect:  Flat  Cognitive:  Appropriate  Insight:  Good  Engagement in Group:  Poor  Modes of Intervention:  Support  Summary of Progress/Problems:  [name] 10/07/2014, 11:57 AM

## 2014-10-07 NOTE — Progress Notes (Signed)
  [facility] :  Will you be returning to the same living situation after discharge:  Yes,  with her children and boyfriend At discharge, do you have transportation home?: Yes,  patient's boyfriend [name] will pick patient up at discharge Do you have the ability to pay for your medications: Yes,  patient has [employer]BS insurance  Release of information consent forms completed and in the chart;  Patient's signature needed at discharge.  Patient to Follow up at: Follow-up Information    Follow up with [facility] On 10/09/2014.   Why:  Hospital Discharge Follow up Walk in clinic from 7:30am-3pm Mon,Wed, Friday   Contact information:   [address] [place] Phone [phone] Fax [phone]      Patient denies SI/HI: Yes,  patient denies SI/HI    Safety Planning and Suicide Prevention discussed: Yes,  SPE discussed with patient and her boyfriend [name]  Have you used any form of tobacco in the last 30 days? (Cigarettes, Smokeless Tobacco, Cigars, and/or Pipes): No  Has patient been referred to the Quitline?: N/A patient is not a smoker  [name], [name] 10/07/2014, 12:06 PM

## 2014-10-07 NOTE — [facility] Group Notes (Signed)
[facility]  10/07/2014 10:07 AM  Participation Quality:  Attentive  Affect:  Appropriate and Flat  Cognitive:  Appropriate  Insight:  Developing/Improving  Engagement in Group:  Developing/Improving  Modes of Intervention:  Discussion, Education and Support  Summary of Progress/Problems:patients goal Talk to Dr Demetrius CharityP and to be discharged today.  Johnella MoloneyBandi, Lyon Dumont M 10/07/2014, 10:07 AM

## 2014-10-07 NOTE — [facility] Suicide Risk Assessment (Signed)
[facility] INPATIENT:  Family/Significant Other Suicide Prevention Education  Suicide Prevention Education:  Education Completed; [name] (boyfriend) [phone] has been identified by the patient as the family member/significant other with whom the patient will be residing, and identified as the person(s) who will aid the patient in the event of a mental health crisis (suicidal ideations/suicide attempt).  With written consent from the patient, the family member/significant other has been provided the following suicide prevention education, prior to the and/or following the discharge of the patient.  The suicide prevention education provided includes the following:  Suicide risk factors  Suicide prevention and interventions  National Suicide Hotline telephone number  [facility] assessment telephone number  [facility] Emergency Assistance 911  [facility] and/or Residential Mobile Crisis Unit telephone number  Request made of family/significant other to:  Remove weapons (e.g., guns, rifles, knives), all items previously/currently identified as safety concern.    Remove drugs/medications (over-the-counter, prescriptions, illicit drugs), all items previously/currently identified as a safety concern.  The family member/significant other verbalizes understanding of the suicide prevention education information provided.  The family member/significant other agrees to remove the items of safety concern listed above.  [name], [name] 10/07/2014, 12:05 PM

## 2014-10-07 NOTE — [facility] Suicide Risk Assessment (Signed)
[facility]   Demographic Factors:  NA  Total Time spent with patient: 30 minutes  Musculoskeletal: Strength & Muscle Tone: within normal limits Gait & Station: normal Patient leans: N/A  Psychiatric Specialty Exam: Physical Exam  Nursing note and vitals reviewed.   Review of Systems  All other systems reviewed and are negative.   Blood pressure 132/85, pulse 92, temperature 98.6 F (37 C), temperature source Oral, resp. rate 20, height 5\' 1"  (1.549 m), weight 75.297 kg (166 lb), last menstrual period 09/24/2014, SpO2 92 %.Body mass index is 31.38 kg/(m^2).  General Appearance: Casual  Eye Contact::  Good  Speech:  Clear and Coherent409  Volume:  Normal  Mood:  Euthymic  Affect:  Appropriate  Thought Process:  Goal Directed and Logical  Orientation:  Full (Time, Place, and Person)  Thought Content:  WDL  Suicidal Thoughts:  No  Homicidal Thoughts:  No  Memory:  Immediate;   Fair Recent;   Fair Remote;   Fair  Judgement:  Good  Insight:  Good  Psychomotor Activity:  Normal  Concentration:  Good  Recall:  Good  Fund of Knowledge:Good  Language: Good  Akathisia:  No  Handed:  Right  AIMS (if indicated):     Assets:  Communication Skills Desire for Improvement Financial Resources/Insurance Housing Intimacy Physical Health Resilience Social Support Talents/Skills Transportation Vocational/Educational  Sleep:  Number of Hours: 4.75  Cognition: WNL  ADL's:  Intact   Have you used any form of tobacco in the last 30 days? (Cigarettes, Smokeless Tobacco, Cigars, and/or Pipes): No  Has this patient used any form of tobacco in the last 30 days? (Cigarettes, Smokeless Tobacco, Cigars, and/or Pipes) No  Mental Status Per Nursing Assessment::   On Admission:  NA  Current Mental Status by Physician: NA  Loss Factors: NA  Historical Factors: NA  Risk Reduction Factors:   Responsible for children under 43 years of age, Sense of  responsibility to family, Employed, Living with another person, especially a relative and Positive social support  Continued Clinical Symptoms:  Bipolar Disorder:   Mixed State  Cognitive Features That Contribute To Risk:  None    Suicide Risk:  Minimal: No identifiable suicidal ideation.  Patients presenting with no risk factors but with morbid ruminations; may be classified as minimal risk based on the severity of the depressive symptoms  Principal Problem: Bipolar affective disorder, manic, severe, with psychotic behavior Discharge Diagnoses:  Patient Active Problem List   Diagnosis Date Noted  . Bipolar I disorder most recent episode mania with psychosis.  [F31.2] 01/25/2007    Priority: High  . Numbness and tingling in hands [R20.2] 06/29/2012  . FATIGUE, CHRONIC [R53.81, R53.83] 01/25/2007    Follow-up Information    Follow up with [facility] On 10/09/2014.   Why:  Hospital Discharge Follow up Walk in clinic from 7:30am-3pm Mon,Wed, Friday   Contact information:   [address] [place] Phone [phone] Fax [phone]      Plan Of Care/Follow-up recommendations:  Activity:  as tolerated Diet:  low sodium heart healthy Other:  keep follow up appointments.  Is patient on multiple antipsychotic therapies at discharge:  No   Has Patient had three or more failed trials of antipsychotic monotherapy by history:  No  Recommended Plan for Multiple Antipsychotic Therapies: NA    Haleem Hanner 10/07/2014, 11:04 AM
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
## 2014-10-07 NOTE — Plan of Care (Signed)
Problem: [facility] Participation in Recreation Therapeutic Interventions Goal: STG-Patient will demonstrate improved self esteem by identif STG: Self-Esteem - Within 5 treatment sessions, patient will verbalize at least 5 positive affirmation statements in each of 3 treatment sessions to increase self-esteem post d/c.  Outcome: Adequate for Discharge Treatment Session 2; Completed 2 out of 3: At approximately 11:55 am, LRT met with patient in hallway. Patient verbalized 5 positive affirmation statements. Patient reported it makes her feel better when she sees them. LRT encouraged patient to continue saying positive affirmation statements.   M , LRT/CTRS 06.06.16 1:22 pm     

## 2014-10-07 NOTE — Progress Notes (Signed)
[facility] H&P Discharge Summary faxed to [facility] for hospital follow-up °

## 2014-10-08 ENCOUNTER — Encounter: Payer: Self-pay | Admitting: Psychiatry

## 2014-10-08 ENCOUNTER — Ambulatory Visit (INDEPENDENT_AMBULATORY_CARE_PROVIDER_SITE_OTHER): Payer: BC Managed Care – PPO | Admitting: Psychiatry

## 2014-10-08 VITALS — BP 118/78 | HR 100 | Temp 99.4°F | Ht 61.0 in

## 2014-10-08 DIAGNOSIS — F3110 Bipolar disorder, current episode manic without psychotic features, unspecified: Secondary | ICD-10-CM
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
## 2014-10-14 ENCOUNTER — Other Ambulatory Visit: Payer: Self-pay

## 2014-10-14 DIAGNOSIS — E049 Nontoxic goiter, unspecified: Secondary | ICD-10-CM | POA: Insufficient documentation

## 2014-10-14 DIAGNOSIS — F3160 Bipolar disorder, current episode mixed, unspecified: Secondary | ICD-10-CM | POA: Insufficient documentation

## 2014-10-14 DIAGNOSIS — Z8639 Personal history of other endocrine, nutritional and metabolic disease: Secondary | ICD-10-CM | POA: Insufficient documentation

## 2014-10-14 DIAGNOSIS — I8390 Asymptomatic varicose veins of unspecified lower extremity: Secondary | ICD-10-CM | POA: Insufficient documentation

## 2014-10-14 DIAGNOSIS — F3110 Bipolar disorder, current episode manic without psychotic features, unspecified: Secondary | ICD-10-CM | POA: Insufficient documentation

## 2014-10-14 DIAGNOSIS — Z7189 Other specified counseling: Secondary | ICD-10-CM | POA: Insufficient documentation

## 2014-10-15 ENCOUNTER — Telehealth: Payer: Self-pay

## 2014-10-15 ENCOUNTER — Ambulatory Visit (INDEPENDENT_AMBULATORY_CARE_PROVIDER_SITE_OTHER): Payer: BC Managed Care – PPO | Admitting: Psychiatry

## 2014-10-15 ENCOUNTER — Encounter: Payer: Self-pay | Admitting: Psychiatry

## 2014-10-15 VITALS — BP 118/78 | HR 90 | Temp 98.9°F | Ht 61.0 in

## 2014-10-15 DIAGNOSIS — F3110 Bipolar disorder, current episode manic without psychotic features, unspecified: Secondary | ICD-10-CM

## 2014-10-15 MED ORDER — TEMAZEPAM 15 MG PO CAPS: 15.0000 mg | ORAL_CAPSULE | Freq: Every evening | ORAL | Status: DC | PRN

## 2014-10-15 MED ORDER — CARBAMAZEPINE 200 MG PO TABS: 200.0000 mg | ORAL_TABLET | Freq: Two times a day (BID) | ORAL | Status: DC

## 2014-10-15 MED ORDER — ZIPRASIDONE HCL 60 MG PO CAPS: 60.0000 mg | ORAL_CAPSULE | Freq: Two times a day (BID) | ORAL | Status: DC
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
## 2014-10-21 ENCOUNTER — Ambulatory Visit: Payer: BC Managed Care – PPO | Admitting: Licensed Clinical Social Worker

## 2014-11-05 ENCOUNTER — Ambulatory Visit: Payer: Self-pay | Admitting: Psychiatry

## 2014-11-05 ENCOUNTER — Ambulatory Visit (INDEPENDENT_AMBULATORY_CARE_PROVIDER_SITE_OTHER): Payer: BC Managed Care – PPO | Admitting: Psychiatry

## 2014-11-05 ENCOUNTER — Encounter: Payer: Self-pay | Admitting: Psychiatry

## 2014-11-05 VITALS — BP 122/68 | HR 88 | Temp 97.9°F | Ht 61.0 in | Wt 177.4 lb

## 2014-11-05 DIAGNOSIS — F3160 Bipolar disorder, current episode mixed, unspecified: Secondary | ICD-10-CM

## 2014-11-05 MED ORDER — CARBAMAZEPINE 200 MG PO TABS: 200.0000 mg | ORAL_TABLET | Freq: Two times a day (BID) | ORAL | Status: DC

## 2014-11-05 MED ORDER — ZIPRASIDONE HCL 60 MG PO CAPS: 60.0000 mg | ORAL_CAPSULE | Freq: Two times a day (BID) | ORAL | Status: DC
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
## 2014-12-07 ENCOUNTER — Other Ambulatory Visit: Payer: Self-pay | Admitting: Psychiatry

## 2014-12-12 ENCOUNTER — Ambulatory Visit (INDEPENDENT_AMBULATORY_CARE_PROVIDER_SITE_OTHER): Payer: BC Managed Care – PPO | Admitting: Psychiatry

## 2014-12-12 ENCOUNTER — Ambulatory Visit (INDEPENDENT_AMBULATORY_CARE_PROVIDER_SITE_OTHER): Payer: BC Managed Care – PPO | Admitting: Licensed Clinical Social Worker

## 2014-12-12 ENCOUNTER — Encounter: Payer: Self-pay | Admitting: Psychiatry

## 2014-12-12 DIAGNOSIS — F3110 Bipolar disorder, current episode manic without psychotic features, unspecified: Secondary | ICD-10-CM | POA: Diagnosis not present

## 2014-12-12 DIAGNOSIS — F3160 Bipolar disorder, current episode mixed, unspecified: Secondary | ICD-10-CM | POA: Diagnosis not present

## 2014-12-12 MED ORDER — ZIPRASIDONE HCL 20 MG PO CAPS: 20.0000 mg | ORAL_CAPSULE | Freq: Every morning | ORAL | Status: DC

## 2014-12-12 MED ORDER — ZIPRASIDONE HCL 60 MG PO CAPS: 60.0000 mg | ORAL_CAPSULE | Freq: Every day | ORAL | Status: DC
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
## 2014-12-25 ENCOUNTER — Ambulatory Visit: Payer: BC Managed Care – PPO | Admitting: Licensed Clinical Social Worker

## 2014-12-30 NOTE — Progress Notes (Signed)
geodon is a reorder not discontinued.  But the restoril and tegretol has been discontinued.

## 2015-02-11 ENCOUNTER — Ambulatory Visit (INDEPENDENT_AMBULATORY_CARE_PROVIDER_SITE_OTHER): Payer: BC Managed Care – PPO | Admitting: Licensed Clinical Social Worker

## 2015-02-11 ENCOUNTER — Ambulatory Visit (INDEPENDENT_AMBULATORY_CARE_PROVIDER_SITE_OTHER): Payer: BC Managed Care – PPO | Admitting: Psychiatry

## 2015-02-11 ENCOUNTER — Encounter: Payer: Self-pay | Admitting: Psychiatry

## 2015-02-11 VITALS — BP 122/86 | HR 107 | Temp 98.4°F | Ht 61.0 in | Wt 161.4 lb

## 2015-02-11 DIAGNOSIS — F3160 Bipolar disorder, current episode mixed, unspecified: Secondary | ICD-10-CM | POA: Diagnosis not present

## 2015-02-11 DIAGNOSIS — F411 Generalized anxiety disorder: Secondary | ICD-10-CM

## 2015-02-11 DIAGNOSIS — F3130 Bipolar disorder, current episode depressed, mild or moderate severity, unspecified: Secondary | ICD-10-CM

## 2015-02-11 MED ORDER — ZIPRASIDONE HCL 20 MG PO CAPS: 20.0000 mg | ORAL_CAPSULE | Freq: Every morning | ORAL | Status: DC

## 2015-02-11 MED ORDER — ZIPRASIDONE HCL 60 MG PO CAPS: 60.0000 mg | ORAL_CAPSULE | Freq: Every day | ORAL | Status: DC
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
## 2015-02-11 NOTE — Progress Notes (Signed)
THERAPIST PROGRESS NOTE  Session Time: 4:05 p.m. - 5:30 p.m.  Participation Level: Active  Behavioral Response: NeatAlertAnxious, Depressed and Dysphoric  Type of Therapy: Individual Therapy  Treatment Goals addressed: Anxiety and Coping  Interventions: Motivational Interviewing, Solution Focused and Supportive  Summary: [name] is a 44 y.o. 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  "Right now I'm trying to just survive."  Previously was able to use what she called scripted curriculum yet given the structure of her new school & classes she does not have these.  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 I just go home and watch t.v."  "I guess maybe because I'm not feeling hypo-manic or I just can't tap into creativity."  Upcoming stress about her Principal sitting in weekly for observation and client experiences this and her principal as very analytical, stoic and to a certain degree critical. 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  On a positive note, she and one colleague are to meet this week to work together.  "I don't want to deal with anything. I just want to sit under my covers. For the past three weeks she reports just wanting to get through the day so I can get back in her bed. "I just don't feel motivated."  Coming home from work and sometimes does not  feel like showering or "It's unusual for me to feel this way."  "I don't feel like eating and I don't feel like cooking. I feel bad because I don't feel motivated enough to get [name] and cook for my children."  Guilty feelings voiced because of her low energy and low motivation and therefore she sees herself as neglecting her children.  She denied emotional/behavioral problems with her son who is transitioning well to being at a new school and in high school.  Her two daughters continue to visit with her every other week-end.  She would like to have the motivation to do more with them.  Continues to itch and have red bumps/rash over her skin. About fifteen minutes following her evening dose of Geodon client reports that she has blisters to come [name] on her hands, feet, thighs, stomach and chest then these turn to red itch spots that dry [name]   She does have some peeling of the skin.    She saw [name] prior to therapy appointment today who is aware of client's skin rash and [name] did not change client's medication and recommended [name] with a dermatologist.  She actually liked Latuda in the past and did not report any side effects with this but because of the cost, which was upwards of $145.00, she stopped taking this.  "You know I don't like to take medications and I stop them?"  She was not interested in LCSW speaking with [name]  about changes in her mood and client's preference for Latuda in the past.  "I get nervous around January around [name] because I've had two break downs at the same time."  [name] was uncertain what triggers/causes may contribute to these episodes this time of the year yet voiced agreement that by keeping a mood journal then she may see a pattern.    GOAL:  "I just want to feel better."  "To have more energy and feel that umph and more driven."    She accepted the article on Depression and Increasing Motivation provided by LCSW.  Ayslin expressed  appropriate feeling of disappointment regarding news of LCSW's last day being 03/07/15, and voiced understanding that she could return to see LCSW at least once more OPT session and could be introduced to other LCSW in the practice.  Client accepted the letter from LCSW and voiced understanding of additional OPT providers/options beyond [facility].  She denied questions or concerns and did not make a commitment one way or the other at time of session.  Suicidal/Homicidal: Negativewithout intent/plan  Therapist Observations/Interventions:  Ongoing support and attempted problem solving and solution focused techniques to assist client to break down tasks associated with school work and development of curriculum.  Assisted her to view and re-frame the feedback from her principal and commended her on being self-reflective and using information provided to her in a more positive way versus internalizing and being overly critical of herself.  Also praised her efforts for reaching out to colleagues.  Suggested that she consider having a medical/physical exam and lab work if she has not lately to rule out a medical factor contributing to her fatigue, beyond her chronic fatigue diagnosis.  LCSW offered to speak to [name] about concerns related to client's depressive symptoms which are not characteristic for her.  Attempted with little success to assist client with identifying and recognizing specific thoughts/beliefs, experiences and/or feelings connected with changes in her mood.  Gently reinforced that changes in mood may be situational since she is in a new position with little guidance compared to previous positions.  Psycho-education with client around the ideas of illness management and recovery which also includes staying aware of patterns in thinking style, environmental factors, etc. And suggested use of an app or some other mood tracker that may assist she and LCSW to understand her triggers and be proactive with  strategies to assist her to prevent future mania/psychosis as well as depressive episodes.  Encouraged client to read article that addresses how to manage depressive symptoms and increase motivation.  Informed client that LCSW's last day will be 03/07/15, and provided her with a letter from [facility] to outline this.  Encouraged her to continue with therapy and to know that if she elected to see another therapist in the community that this would not interfere with her seeing [name].  Thanked client for trusting LCSW with her personal struggles and reinforced her internal strengths while validating her need to have more social support and interactions.    Recommendation/Plan: [name] will take prescribed medications as ordered and keep all medical, therapy and psychiatric appointments.  Client will follow [name] for OPT PRN and notify [facility] of her decision about referral to another OPT provider.  She will follow through with recommendations of learning more effective stress management strategies, self-care and various triggers for her mania and be proactive in seeking care for herself prior to becoming symptomatic and psychotic.  Diagnosis: Bi-Polar I Disorder, MRE, Depressed,  Moderate   Anxiety State  [name], [place] 02/11/2015

## 2015-04-21 ENCOUNTER — Ambulatory Visit: Payer: BC Managed Care – PPO | Admitting: Psychiatry

## 2015-05-15 ENCOUNTER — Encounter: Payer: Self-pay | Admitting: Psychiatry

## 2015-05-15 ENCOUNTER — Ambulatory Visit (INDEPENDENT_AMBULATORY_CARE_PROVIDER_SITE_OTHER): Payer: BC Managed Care – PPO | Admitting: Psychiatry

## 2015-05-15 VITALS — BP 110/86 | HR 97 | Temp 98.0°F | Ht 61.0 in | Wt 161.2 lb

## 2015-05-15 DIAGNOSIS — F3160 Bipolar disorder, current episode mixed, unspecified: Secondary | ICD-10-CM

## 2015-05-15 MED ORDER — ZIPRASIDONE HCL 20 MG PO CAPS: 20.0000 mg | ORAL_CAPSULE | Freq: Every morning | ORAL | Status: DC

## 2015-05-15 MED ORDER — ZIPRASIDONE HCL 60 MG PO CAPS: 60.0000 mg | ORAL_CAPSULE | Freq: Every day | ORAL | Status: DC
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
## 2015-08-12 ENCOUNTER — Ambulatory Visit: Payer: BC Managed Care – PPO | Admitting: Psychiatry

## 2015-08-13 ENCOUNTER — Encounter: Payer: Self-pay | Admitting: Psychiatry

## 2015-08-13 ENCOUNTER — Ambulatory Visit (INDEPENDENT_AMBULATORY_CARE_PROVIDER_SITE_OTHER): Payer: BC Managed Care – PPO | Admitting: Psychiatry

## 2015-08-13 VITALS — BP 118/84 | HR 94 | Temp 98.3°F | Ht 61.0 in | Wt 159.0 lb

## 2015-08-13 DIAGNOSIS — F3160 Bipolar disorder, current episode mixed, unspecified: Secondary | ICD-10-CM

## 2015-08-13 MED ORDER — ZIPRASIDONE HCL 20 MG PO CAPS: 20.0000 mg | ORAL_CAPSULE | Freq: Every morning | ORAL | Status: DC

## 2015-08-13 MED ORDER — ZIPRASIDONE HCL 60 MG PO CAPS: 60.0000 mg | ORAL_CAPSULE | Freq: Every day | ORAL | Status: DC
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
## 2015-09-24 ENCOUNTER — Ambulatory Visit: Payer: No Typology Code available for payment source | Admitting: Psychiatry

## 2015-10-07 ENCOUNTER — Ambulatory Visit: Payer: No Typology Code available for payment source | Admitting: Psychiatry

## 2015-12-15 ENCOUNTER — Other Ambulatory Visit: Payer: Self-pay | Admitting: Psychiatry

## 2016-04-23 ENCOUNTER — Ambulatory Visit (INDEPENDENT_AMBULATORY_CARE_PROVIDER_SITE_OTHER): Payer: Managed Care, Other (non HMO) | Admitting: Family Medicine

## 2016-04-23 ENCOUNTER — Encounter: Payer: Self-pay | Admitting: Family Medicine

## 2016-04-23 VITALS — BP 124/72 | HR 89 | Temp 98.4°F | Ht 60.25 in | Wt 189.5 lb

## 2016-04-23 DIAGNOSIS — E559 Vitamin D deficiency, unspecified: Secondary | ICD-10-CM | POA: Diagnosis not present

## 2016-04-23 DIAGNOSIS — E66811 Obesity, class 1: Secondary | ICD-10-CM

## 2016-04-23 DIAGNOSIS — Z8639 Personal history of other endocrine, nutritional and metabolic disease: Secondary | ICD-10-CM | POA: Diagnosis not present

## 2016-04-23 DIAGNOSIS — Z309 Encounter for contraceptive management, unspecified: Secondary | ICD-10-CM

## 2016-04-23 DIAGNOSIS — E049 Nontoxic goiter, unspecified: Secondary | ICD-10-CM

## 2016-04-23 DIAGNOSIS — F3160 Bipolar disorder, current episode mixed, unspecified: Secondary | ICD-10-CM | POA: Diagnosis not present

## 2016-04-23 DIAGNOSIS — Z308 Encounter for other contraceptive management: Secondary | ICD-10-CM

## 2016-04-23 DIAGNOSIS — E669 Obesity, unspecified: Secondary | ICD-10-CM | POA: Insufficient documentation

## 2016-04-23 LAB — POCT URINE PREGNANCY: Preg Test, Ur: NEGATIVE

## 2016-04-23 MED ORDER — MEDROXYPROGESTERONE ACETATE 150 MG/ML IM SUSP
150.0000 mg | Freq: Once | INTRAMUSCULAR | Status: AC
  Administered 2016-04-23: 150 mg via INTRAMUSCULAR

## 2016-04-23 NOTE — Assessment & Plan Note (Signed)
Well controlled on Geodon, but cannot tolerate 80 mg dose.. Uses 20 mg daily.  NO recent hospitalizations. Followed by Dr. Garnetta BuddyFaheem.

## 2016-04-23 NOTE — Assessment & Plan Note (Signed)
Nml function.

## 2016-04-23 NOTE — Assessment & Plan Note (Signed)
On weekly supplement.

## 2016-04-23 NOTE — Progress Notes (Signed)
Subjective:    Patient ID: [name], female    DOB: 1970-05-15, 45 y.o.   MRN: [id number]  HPI   45 year old female presents to re-establish care.  She has been seeing Dr. Dario GuardianJadali, ENDO. She could not afford since not in network. Last OV 01/2016.[name]. Seeing him every 3 months.  Hypothyroidism: She has not required therapy in past 2 years. Last TSH 01/2016.  Bipolar disorder Followed by Dr. Garnetta BuddyFaheem psychiatry.  She is stable on 20 mg of geodon.  Poor sleep is a trigger. She is doing well at this time with sleep.  High cholesterol... Due to  Geodon. On crestor 10 mg to treat.   She has gained a lot of weight since summer.  She has stopped going to the gym during work.  She has restarted going daily. She is working on healthy eating habits, vegetarian diet, fish. She has been eating more bread. Wt Readings from Last 3 Encounters:  04/23/16 189 lb 8 oz (86 kg)  10/02/14 173 lb (78.5 kg)  06/29/12 169 lb 12 oz (77 kg)   Body mass index is 36.7 kg/m.  She sees health dept for CPX.[name]. Getting Depo there. Last OV 9.2016. Last given 9/17.. Due now for repeat.  Social History /Family History/Past Medical History reviewed and updated if needed.   Review of Systems  Constitutional: Negative for fatigue and fever.  HENT: Negative for congestion.   Eyes: Negative for pain.  Respiratory: Negative for cough and shortness of breath.   Cardiovascular: Negative for chest pain, palpitations and leg swelling.  Gastrointestinal: Negative for abdominal pain.  Genitourinary: Negative for dysuria and vaginal bleeding.  Musculoskeletal: Negative for back pain.  Neurological: Negative for syncope, light-headedness and headaches.  Psychiatric/Behavioral: Negative for dysphoric mood.       Objective:   Physical Exam  Constitutional: Vital signs are normal. She appears well-developed and well-nourished. She is cooperative.  Non-toxic appearance. She does not appear ill. No distress.    obese  HENT:  Head: Normocephalic.  Right Ear: Hearing, tympanic membrane, external ear and ear canal normal.  Left Ear: Hearing, tympanic membrane, external ear and ear canal normal.  Nose: Nose normal.  Eyes: Conjunctivae, EOM and lids are normal. Pupils are equal, round, and reactive to light. Lids are everted and swept, no foreign bodies found.  Neck: Trachea normal and normal range of motion. Neck supple. Carotid bruit is not present. No thyroid mass and no thyromegaly present.  Cardiovascular: Normal rate, regular rhythm, S1 normal, S2 normal, normal heart sounds and intact distal pulses.  Exam reveals no gallop.   No murmur heard. Pulmonary/Chest: Effort normal and breath sounds normal. No respiratory distress. She has no wheezes. She has no rhonchi. She has no rales.  Abdominal: Soft. Normal appearance and bowel sounds are normal. She exhibits no distension, no fluid wave, no abdominal bruit and no mass. There is no hepatosplenomegaly. There is no tenderness. There is no rebound, no guarding and no CVA tenderness. No hernia.  Lymphadenopathy:    She has no cervical adenopathy.    She has no axillary adenopathy.  Neurological: She is alert. She has normal strength. No cranial nerve deficit or sensory deficit.  Skin: Skin is warm, dry and intact. No rash noted.  Psychiatric: Her speech is normal and behavior is normal. Judgment normal. Her mood appears not anxious. Cognition and memory are normal. She does not exhibit a depressed mood.  Assessment & Plan:

## 2016-04-23 NOTE — Assessment & Plan Note (Addendum)
Stable control in last 2 years on  No meds per pt. Will get records from Dr. Dario GuardianJAdali to determine when next check needs to be.

## 2016-04-23 NOTE — Patient Instructions (Addendum)
Keep up with regular exercise, work on low carb diet  We will get records from Dr. Dario GuardianJadali and health department.

## 2016-04-23 NOTE — Assessment & Plan Note (Signed)
Will get old records to determine when next check due. Encouraged exercise, weight loss, healthy eating habits.

## 2016-04-23 NOTE — Assessment & Plan Note (Signed)
Upreg neg. Depo given.

## 2016-05-07 ENCOUNTER — Encounter: Payer: Self-pay | Admitting: Family Medicine

## 2016-07-23 ENCOUNTER — Encounter: Payer: Self-pay | Admitting: Family Medicine

## 2016-07-23 ENCOUNTER — Ambulatory Visit (INDEPENDENT_AMBULATORY_CARE_PROVIDER_SITE_OTHER): Payer: 59 | Admitting: Family Medicine

## 2016-07-23 ENCOUNTER — Encounter (INDEPENDENT_AMBULATORY_CARE_PROVIDER_SITE_OTHER): Payer: Self-pay

## 2016-07-23 VITALS — BP 130/94 | HR 112 | Temp 98.3°F | Ht 60.25 in | Wt 195.0 lb

## 2016-07-23 DIAGNOSIS — B9789 Other viral agents as the cause of diseases classified elsewhere: Secondary | ICD-10-CM | POA: Diagnosis not present

## 2016-07-23 DIAGNOSIS — J069 Acute upper respiratory infection, unspecified: Secondary | ICD-10-CM | POA: Diagnosis not present

## 2016-07-23 DIAGNOSIS — R8290 Unspecified abnormal findings in urine: Secondary | ICD-10-CM | POA: Diagnosis not present

## 2016-07-23 DIAGNOSIS — Z3042 Encounter for surveillance of injectable contraceptive: Secondary | ICD-10-CM | POA: Diagnosis not present

## 2016-07-23 LAB — POC URINALSYSI DIPSTICK (AUTOMATED)
BILIRUBIN UA: NEGATIVE
GLUCOSE UA: NEGATIVE
Ketones, UA: NEGATIVE
LEUKOCYTES UA: NEGATIVE
NITRITE UA: NEGATIVE
Protein, UA: NEGATIVE
RBC UA: NEGATIVE
Spec Grav, UA: 1.025 (ref 1.030–1.035)
Urobilinogen, UA: 0.2 (ref ?–2.0)
pH, UA: 7 (ref 5.0–8.0)

## 2016-07-23 MED ORDER — MEDROXYPROGESTERONE ACETATE 150 MG/ML IM SUSP
150.0000 mg | Freq: Once | INTRAMUSCULAR | Status: AC
  Administered 2016-07-23: 150 mg via INTRAMUSCULAR

## 2016-07-23 NOTE — Assessment & Plan Note (Signed)
Eval with UA. Increase water intake.

## 2016-07-23 NOTE — Addendum Note (Signed)
Addended by: Damita LackLORING, DONNA S on: 07/23/2016 04:20 PM   Modules accepted: Orders

## 2016-07-23 NOTE — Progress Notes (Signed)
   Subjective:    Patient ID: [name], female    DOB: [birth year], 46 y.o.   MRN: [id number]  HPI    46 year old female presents for multiple issues.  She has had congestion and cough in last 1 week.  Dry cough has progressed to yellow mucus.  No fever, no SOB, no wheeze.  No ear pain, ears full, no sinus pain and pressure.  off and on headache.  She is mucinex and theraflu tea. Minimally effective.  She has noted 1 week of increase in odor of urine. No abd pain, no dysuria, no change in frequency. No hematuria.  Hx of UTI in past.  BP Readings from Last 3 Encounters:  07/23/16 (!) 130/94  04/23/16 124/72  10/02/14 (!) 169/81      Review of Systems  Constitutional: Positive for fatigue. Negative for fever.  HENT: Negative for ear pain.   Eyes: Negative for pain.  Respiratory: Negative for chest tightness and shortness of breath.   Cardiovascular: Negative for chest pain, palpitations and leg swelling.  Gastrointestinal: Negative for abdominal pain.  Genitourinary: Negative for dysuria.       Objective:   Physical Exam  Constitutional: Vital signs are normal. She appears well-developed and well-nourished. She is cooperative.  Non-toxic appearance. She does not appear ill. No distress.  HENT:  Head: Normocephalic.  Right Ear: Hearing, external ear and ear canal normal. Tympanic membrane is not erythematous, not retracted and not bulging. A middle ear effusion is present.  Left Ear: Hearing, external ear and ear canal normal. Tympanic membrane is not erythematous, not retracted and not bulging. A middle ear effusion is present.  Nose: Mucosal edema present. No rhinorrhea. Right sinus exhibits no maxillary sinus tenderness and no frontal sinus tenderness. Left sinus exhibits no maxillary sinus tenderness and no frontal sinus tenderness.  Mouth/Throat: Uvula is midline, oropharynx is clear and moist and mucous membranes are normal.  Eyes: Conjunctivae, EOM  and lids are normal. Pupils are equal, round, and reactive to light. Lids are everted and swept, no foreign bodies found.  Neck: Trachea normal and normal range of motion. Neck supple. Carotid bruit is not present. No thyroid mass and no thyromegaly present.  Cardiovascular: Normal rate, regular rhythm, S1 normal, S2 normal, normal heart sounds, intact distal pulses and normal pulses.  Exam reveals no gallop and no friction rub.   No murmur heard. Pulmonary/Chest: Effort normal and breath sounds normal. No tachypnea. No respiratory distress. She has no decreased breath sounds. She has no wheezes. She has no rhonchi. She has no rales.  Abdominal: Soft. Normal appearance and bowel sounds are normal. There is no tenderness.  Neurological: She is alert.  Skin: Skin is warm, dry and intact. No rash noted.  Psychiatric: Her speech is normal and behavior is normal. Judgment and thought content normal. Her mood appears not anxious. Cognition and memory are normal. She does not exhibit a depressed mood.          Assessment & Plan:

## 2016-07-23 NOTE — Patient Instructions (Addendum)
Follow BP at home.. Call if > 140/90.  Add flonase 2 sprays per nostril daily as well nasal saline spray 2-3 times daily. Increase water intake.

## 2016-07-23 NOTE — Progress Notes (Signed)
Pre visit review using our clinic review tool, if applicable. No additional management support is needed unless otherwise documented below in the visit note. 

## 2016-07-23 NOTE — Assessment & Plan Note (Signed)
No sign of bacterial infection. Symptomatic acre.

## 2016-07-23 NOTE — Addendum Note (Signed)
Addended by: Damita LackLORING, Yaman Grauberger S on: 07/23/2016 04:49 PM   Modules accepted: Orders

## 2016-10-19 ENCOUNTER — Ambulatory Visit (INDEPENDENT_AMBULATORY_CARE_PROVIDER_SITE_OTHER): Payer: 59 | Admitting: *Deleted

## 2016-10-19 DIAGNOSIS — Z3042 Encounter for surveillance of injectable contraceptive: Secondary | ICD-10-CM | POA: Diagnosis not present

## 2016-10-19 MED ORDER — MEDROXYPROGESTERONE ACETATE 150 MG/ML IM SUSP
150.0000 mg | Freq: Once | INTRAMUSCULAR | Status: AC
  Administered 2016-10-19: 150 mg via INTRAMUSCULAR

## 2017-01-06 ENCOUNTER — Ambulatory Visit (INDEPENDENT_AMBULATORY_CARE_PROVIDER_SITE_OTHER): Payer: 59

## 2017-01-06 DIAGNOSIS — Z3042 Encounter for surveillance of injectable contraceptive: Secondary | ICD-10-CM | POA: Diagnosis not present

## 2017-01-06 LAB — POCT URINE PREGNANCY: Preg Test, Ur: NEGATIVE

## 2017-01-06 MED ORDER — MEDROXYPROGESTERONE ACETATE 150 MG/ML IM SUSP
150.0000 mg | Freq: Once | INTRAMUSCULAR | Status: AC
  Administered 2017-01-06: 150 mg via INTRAMUSCULAR

## 2017-01-06 NOTE — Patient Instructions (Signed)
Pt was here for a depo shot, pt reported abnormal vaginal spotting. A pregnancy test was done, and it was negative. Pt was advised to contact PCP if spotting continues.

## 2017-03-14 ENCOUNTER — Encounter: Payer: Self-pay | Admitting: Emergency Medicine

## 2017-03-14 ENCOUNTER — Emergency Department
Admission: EM | Admit: 2017-03-14 | Discharge: 2017-03-15 | Disposition: A | Discharge status: Other Acute Inpatient Hospital | Payer: 59 | Attending: Emergency Medicine | Admitting: Emergency Medicine

## 2017-03-14 ENCOUNTER — Other Ambulatory Visit: Payer: Self-pay

## 2017-03-14 DIAGNOSIS — W2209XA Striking against other stationary object, initial encounter: Secondary | ICD-10-CM | POA: Insufficient documentation

## 2017-03-14 DIAGNOSIS — Z91199 Patient's noncompliance with other medical treatment and regimen due to unspecified reason: Secondary | ICD-10-CM

## 2017-03-14 DIAGNOSIS — Y999 Unspecified external cause status: Secondary | ICD-10-CM | POA: Insufficient documentation

## 2017-03-14 DIAGNOSIS — S0990XA Unspecified injury of head, initial encounter: Secondary | ICD-10-CM | POA: Insufficient documentation

## 2017-03-14 DIAGNOSIS — E039 Hypothyroidism, unspecified: Secondary | ICD-10-CM | POA: Insufficient documentation

## 2017-03-14 DIAGNOSIS — Z87891 Personal history of nicotine dependence: Secondary | ICD-10-CM | POA: Diagnosis not present

## 2017-03-14 DIAGNOSIS — Y92230 Patient room in hospital as the place of occurrence of the external cause: Secondary | ICD-10-CM | POA: Diagnosis not present

## 2017-03-14 DIAGNOSIS — Z79899 Other long term (current) drug therapy: Secondary | ICD-10-CM | POA: Diagnosis not present

## 2017-03-14 DIAGNOSIS — F319 Bipolar disorder, unspecified: Secondary | ICD-10-CM | POA: Diagnosis present

## 2017-03-14 DIAGNOSIS — Z8639 Personal history of other endocrine, nutritional and metabolic disease: Secondary | ICD-10-CM

## 2017-03-14 DIAGNOSIS — F3160 Bipolar disorder, current episode mixed, unspecified: Secondary | ICD-10-CM | POA: Diagnosis not present

## 2017-03-14 DIAGNOSIS — Z9119 Patient's noncompliance with other medical treatment and regimen: Secondary | ICD-10-CM

## 2017-03-14 DIAGNOSIS — Y9389 Activity, other specified: Secondary | ICD-10-CM | POA: Diagnosis not present

## 2017-03-14 LAB — COMPREHENSIVE METABOLIC PANEL
ALBUMIN: 4.2 g/dL (ref 3.5–5.0)
ALT: 17 U/L (ref 14–54)
AST: 25 U/L (ref 15–41)
Alkaline Phosphatase: 77 U/L (ref 38–126)
Anion gap: 11 (ref 5–15)
BUN: 13 mg/dL (ref 6–20)
CHLORIDE: 105 mmol/L (ref 101–111)
CO2: 22 mmol/L (ref 22–32)
CREATININE: 0.73 mg/dL (ref 0.44–1.00)
Calcium: 9.1 mg/dL (ref 8.9–10.3)
GFR calc non Af Amer: >60 mL/min (ref >60)
GLUCOSE: 146 mg/dL — AB (ref 65–99)
Potassium: 3.8 mmol/L (ref 3.5–5.1)
SODIUM: 138 mmol/L (ref 135–145)
Total Bilirubin: 0.6 mg/dL (ref 0.3–1.2)
Total Protein: 7.8 g/dL (ref 6.5–8.1)

## 2017-03-14 LAB — CBC
HEMATOCRIT: 44 % (ref 35.0–47.0)
HEMOGLOBIN: 14.4 g/dL (ref 12.0–16.0)
MCH: 28.3 pg (ref 26.0–34.0)
MCHC: 32.8 g/dL (ref 32.0–36.0)
MCV: 86.4 fL (ref 80.0–100.0)
Platelets: 362 10*3/uL (ref 150–440)
RBC: 5.1 MIL/uL (ref 3.80–5.20)
RDW: 12.9 % (ref 11.5–14.5)
WBC: 12.9 10*3/uL — ABNORMAL HIGH (ref 3.6–11.0)

## 2017-03-14 LAB — ETHANOL: Alcohol, Ethyl (B): <10 mg/dL (ref <10)

## 2017-03-14 LAB — ACETAMINOPHEN LEVEL: Acetaminophen (Tylenol), Serum: <10 ug/mL — ABNORMAL LOW (ref 10–30)

## 2017-03-14 LAB — SALICYLATE LEVEL

## 2017-03-14 NOTE — ED Notes (Signed)
Pt resting on stretcher with eyes closed. No distress noted. Pt informed the ODS officer "I need to talk to my son. My son is the devil." MD aware.

## 2017-03-14 NOTE — ED Provider Notes (Signed)
[facility] Emergency Department Provider Note   ____________________________________________   First MD Initiated Contact with Patient 03/14/17 2114     (approximate)  I have reviewed the triage vital signs and the nursing notes.   HISTORY  Chief Complaint Psychiatric Evaluation    HPI Ralph DowdyChelsea Cattouse Andrey CampanileWilson is a 46 y.o. female Per history from boyfriend patient think she is a different person when she has her problems. She's been hearing voices talking to herself and thinking she someone else. Patient herself says she is feeling fine and there is nothing wrong..   Past Medical History:  Diagnosis Date  . Hyperlipemia   . Other bipolar disorders   . Other malaise and fatigue   . Unspecified hypothyroidism     Patient Active Problem List   Diagnosis Date Noted  . Viral URI with cough 07/23/2016  . Abnormal urine odor 07/23/2016  . Vitamin D deficiency 04/23/2016  . Obesity (BMI 30.0-34.9) 04/23/2016  . Contraception management 04/23/2016  . Bipolar 1 disorder, mixed (HCC) 10/14/2014  . H/O hypercholesterolemia 10/14/2014  . H/O: hypothyroidism 10/14/2014  . Counseling for guardian-child conflict 10/14/2014  . Thyroid goiter 10/14/2014  . FATIGUE, CHRONIC 01/25/2007    Past Surgical History:  Procedure Laterality Date  . radioablation of thyroid Bilateral     Prior to Admission medications   Medication Sig Start Date End Date Taking? Authorizing Provider  medroxyPROGESTERone (DEPO-PROVERA) 150 MG/ML injection Inject 150 mg into the muscle every 3 (three) months.    [provider]    Allergies Patient has no known allergies.  Family History  Problem Relation Age of Onset  . Mental illness Mother   . Bipolar disorder Mother   . Hypertension Father   . Hypertension Sister   . Depression Brother   . Hypertension Sister   . Alzheimer's disease Maternal Grandmother   . Heart attack Maternal Grandfather     Social  History Social History   Tobacco Use  . Smoking status: Former Smoker    Last attempt to quit: 10/08/1979    Years since quitting: 37.4  . Smokeless tobacco: Never Used  Substance Use Topics  . Alcohol use: No    Alcohol/week: 0.0 oz  . Drug use: No    Review of Systems  Constitutional: No fever/chills Eyes: No visual changes. ENT: No sore throat. Cardiovascular: Denies chest pain. Respiratory: Denies shortness of breath. Gastrointestinal: No abdominal pain.  No nausea, no vomiting.  No diarrhea.  No constipation. Genitourinary: Negative for dysuria. Musculoskeletal: Negative for back pain. Skin: Negative for rash. Neurological: Negative for headaches, focal weakness   ____________________________________________   PHYSICAL EXAM:  VITAL SIGNS: ED Triage Vitals [03/14/17 1905]  Enc Vitals Group     BP (!) 157/88     Pulse Rate (!) 130     Resp (!) 22     Temp 98.1 F (36.7 C)     Temp Source Oral     SpO2 99 %     Weight 160 lb (72.6 kg)     Height 5\' 1"  (1.549 m)     Head Circumference      Peak Flow      Pain Score      Pain Loc      Pain Edu?      Excl. in GC?     Constitutional: Alert and oriented. Well appearing and in no acute distress. Eyes: Conjunctivae are normal. PER. EOMI. Head: Atraumatic. Nose: No congestion/rhinnorhea. Mouth/Throat: Mucous membranes  are moist.  Oropharynx non-erythematous. Neck: No stridor. Cardiovascular: Normal rate, regular rhythm. Grossly normal heart sounds.  Good peripheral circulation.on my examination patient is no longer tachycardic or heart rate is approximately 85 Respiratory: Normal respiratory effort.  No retractions. Lungs CTAB. Gastrointestinal: Soft and nontender. No distention. No abdominal bruits. No CVA tenderness. Musculoskeletal: No lower extremity tenderness nor edema.  No joint effusions. Neurologic:  Normal speech and language. No gross focal neurologic deficits are appreciated. No gait  instability. Skin:  Skin is warm, dry and intact. No rash noted.  ____________________________________________   LABS (all labs ordered are listed, but only abnormal results are displayed)  Labs Reviewed  COMPREHENSIVE METABOLIC PANEL - Abnormal; Notable for the following components:      Result Value   Glucose, Bld 146 (*)    All other components within normal limits  ACETAMINOPHEN LEVEL - Abnormal; Notable for the following components:   Acetaminophen (Tylenol), Serum <10 (*)    All other components within normal limits  CBC - Abnormal; Notable for the following components:   WBC 12.9 (*)    All other components within normal limits  ETHANOL  SALICYLATE LEVEL  URINE DRUG SCREEN, QUALITATIVE ([facility] ONLY)  POC URINE PREG, ED   ____________________________________________  EKG   ____________________________________________  RADIOLOGY  ____________________________________________   PROCEDURES  Procedure(s) performed:   Procedures  Critical Care performed: ____________________________________________   INITIAL IMPRESSION / ASSESSMENT AND PLAN / ED COURSE  As part of my medical decision making, I reviewed the following data within the electronic MEDICAL RECORD NUMBER   Clinical Course as of Mar 14 2122  Mon Mar 14, 2017  2114 Chloride: 105 [PM]    Clinical Course User Index [PM] Arnaldo NatalMalinda, Jacquez Sheetz F, MD     ____________________________________________   FINAL CLINICAL IMPRESSION(S) / ED DIAGNOSES  Final diagnoses:  Bipolar affective disorder, remission status unspecified [facility](HCC)     ED Discharge Orders    None       Note:  This document was prepared using Dragon voice recognition software and may include unintentional dictation errors.    Arnaldo NatalMalinda, Jakob Kimberlin F, MD 03/14/17 2123

## 2017-03-14 NOTE — ED Notes (Signed)
Pt seen attempting to come out of exam room. Pt states she is ready to go because the MD said she was ok. Pt states "my son thinks I need to stay here because I'm safe here." easily redirected back to exam room. Dr. Darnelle CatalanMalinda aware. Preparing pt to be taken to the [facility].

## 2017-03-14 NOTE — ED Notes (Signed)
FIRST NURSE NOTE: Pt arrives to ER via POV because she "has bipolar". Not eating well, talking to herself per visitor with patient. Pt in NAD at this time. RR even an unlabored.

## 2017-03-14 NOTE — ED Triage Notes (Signed)
Patient to ER with boyfriend for c/o "mental breakdown". Boyfriend states patient has h/o bipolar, typically thinks she's a different person when she has episodes with bipolar. Patient currently alert and oriented, but boyfriend states patient has been hearing voices, talking to herself, and thinking she is somebody else. Patient denies SI/HI. Son was just diagnosed with the same.

## 2017-03-14 NOTE — ED Notes (Signed)
Pt refused a meal tray st' :" I just want to sleep". Pt st that they are not hungry and did not want anything to drink either

## 2017-03-15 ENCOUNTER — Emergency Department: Payer: 59

## 2017-03-15 ENCOUNTER — Inpatient Hospital Stay
Admission: AD | Admit: 2017-03-15 | Discharge: 2017-03-21 | DRG: 885 | Disposition: A | Discharge status: Home / Self Care | Payer: 59 | Source: Intra-hospital | Attending: Psychiatry | Admitting: Psychiatry

## 2017-03-15 DIAGNOSIS — F3160 Bipolar disorder, current episode mixed, unspecified: Secondary | ICD-10-CM

## 2017-03-15 DIAGNOSIS — Z818 Family history of other mental and behavioral disorders: Secondary | ICD-10-CM | POA: Diagnosis not present

## 2017-03-15 DIAGNOSIS — S0990XA Unspecified injury of head, initial encounter: Secondary | ICD-10-CM

## 2017-03-15 DIAGNOSIS — Z915 Personal history of self-harm: Secondary | ICD-10-CM

## 2017-03-15 DIAGNOSIS — Z9114 Patient's other noncompliance with medication regimen: Secondary | ICD-10-CM

## 2017-03-15 DIAGNOSIS — E785 Hyperlipidemia, unspecified: Secondary | ICD-10-CM | POA: Diagnosis present

## 2017-03-15 DIAGNOSIS — Z79899 Other long term (current) drug therapy: Secondary | ICD-10-CM | POA: Diagnosis not present

## 2017-03-15 DIAGNOSIS — Z9119 Patient's noncompliance with other medical treatment and regimen: Secondary | ICD-10-CM

## 2017-03-15 DIAGNOSIS — Z87891 Personal history of nicotine dependence: Secondary | ICD-10-CM

## 2017-03-15 DIAGNOSIS — F3164 Bipolar disorder, current episode mixed, severe, with psychotic features: Principal | ICD-10-CM | POA: Diagnosis present

## 2017-03-15 DIAGNOSIS — Z91199 Patient's noncompliance with other medical treatment and regimen due to unspecified reason: Secondary | ICD-10-CM

## 2017-03-15 DIAGNOSIS — E039 Hypothyroidism, unspecified: Secondary | ICD-10-CM | POA: Diagnosis present

## 2017-03-15 DIAGNOSIS — G4700 Insomnia, unspecified: Secondary | ICD-10-CM | POA: Diagnosis not present

## 2017-03-15 DIAGNOSIS — L299 Pruritus, unspecified: Secondary | ICD-10-CM | POA: Diagnosis not present

## 2017-03-15 LAB — URINALYSIS, COMPLETE (UACMP) WITH MICROSCOPIC
BILIRUBIN URINE: NEGATIVE
GLUCOSE, UA: NEGATIVE mg/dL
Hgb urine dipstick: NEGATIVE
KETONES UR: NEGATIVE mg/dL
LEUKOCYTES UA: NEGATIVE
Nitrite: NEGATIVE
PH: 6 (ref 5.0–8.0)
PROTEIN: NEGATIVE mg/dL
Specific Gravity, Urine: 1.012 (ref 1.005–1.030)

## 2017-03-15 LAB — URINE DRUG SCREEN, QUALITATIVE (ARMC ONLY)
AMPHETAMINES, UR SCREEN: NOT DETECTED
Barbiturates, Ur Screen: NOT DETECTED
Benzodiazepine, Ur Scrn: POSITIVE — AB
Cannabinoid 50 Ng, Ur ~~LOC~~: NOT DETECTED
Cocaine Metabolite,Ur ~~LOC~~: NOT DETECTED
MDMA (ECSTASY) UR SCREEN: NOT DETECTED
Methadone Scn, Ur: NOT DETECTED
OPIATE, UR SCREEN: NOT DETECTED
PHENCYCLIDINE (PCP) UR S: NOT DETECTED
Tricyclic, Ur Screen: NOT DETECTED

## 2017-03-15 LAB — PREGNANCY, URINE: Preg Test, Ur: NEGATIVE

## 2017-03-15 MED ORDER — ZIPRASIDONE HCL 20 MG PO CAPS: 60.0000 mg | ORAL_CAPSULE | Freq: Two times a day (BID) | ORAL | Status: DC

## 2017-03-15 MED ORDER — MAGNESIUM HYDROXIDE 400 MG/5ML PO SUSP
30.0000 mL | Freq: Every day | ORAL | Status: DC | PRN
  Administered 2017-03-18: 30 mL via ORAL
  Filled 2017-03-15: qty 30

## 2017-03-15 MED ORDER — CARBAMAZEPINE 200 MG PO TABS
200.0000 mg | ORAL_TABLET | Freq: Two times a day (BID) | ORAL | Status: DC
  Administered 2017-03-15: 200 mg via ORAL
  Filled 2017-03-15: qty 1

## 2017-03-15 MED ORDER — LORAZEPAM 2 MG PO TABS
2.0000 mg | ORAL_TABLET | Freq: Four times a day (QID) | ORAL | Status: DC | PRN
  Administered 2017-03-15 – 2017-03-17 (×2): 2 mg via ORAL
  Filled 2017-03-15 (×2): qty 1

## 2017-03-15 MED ORDER — LORAZEPAM 2 MG PO TABS
2.0000 mg | ORAL_TABLET | Freq: Four times a day (QID) | ORAL | Status: DC | PRN
Start: 2017-03-15 — End: 2017-03-15
  Administered 2017-03-15: 2 mg via ORAL
  Filled 2017-03-15: qty 1

## 2017-03-15 MED ORDER — ZIPRASIDONE MESYLATE 20 MG IM SOLR: 20.0000 mg | Freq: Two times a day (BID) | INTRAMUSCULAR | Status: DC | PRN

## 2017-03-15 MED ORDER — ZIPRASIDONE MESYLATE 20 MG IM SOLR
20.0000 mg | Freq: Two times a day (BID) | INTRAMUSCULAR | Status: DC | PRN
  Administered 2017-03-15: 20 mg via INTRAMUSCULAR
  Filled 2017-03-15: qty 20

## 2017-03-15 MED ORDER — ACETAMINOPHEN 325 MG PO TABS: 650.0000 mg | ORAL_TABLET | Freq: Four times a day (QID) | ORAL | Status: DC | PRN

## 2017-03-15 MED ORDER — ALUM & MAG HYDROXIDE-SIMETH 200-200-20 MG/5ML PO SUSP: 30.0000 mL | ORAL | Status: DC | PRN

## 2017-03-15 MED ORDER — CARBAMAZEPINE 200 MG PO TABS
200.0000 mg | ORAL_TABLET | Freq: Two times a day (BID) | ORAL | Status: DC
  Administered 2017-03-15 – 2017-03-21 (×12): 200 mg via ORAL
  Filled 2017-03-15 (×12): qty 1

## 2017-03-15 MED ORDER — ZIPRASIDONE HCL 40 MG PO CAPS: 60.0000 mg | ORAL_CAPSULE | Freq: Two times a day (BID) | ORAL | Status: DC

## 2017-03-15 MED ORDER — HALOPERIDOL 5 MG PO TABS
5.0000 mg | ORAL_TABLET | Freq: Once | ORAL | Status: AC
  Administered 2017-03-15: 5 mg via ORAL
  Filled 2017-03-15: qty 1

## 2017-03-15 NOTE — [facility] Group Notes (Signed)
[facility] Group Notes:  (Nursing/MHT/Case Management/Adjunct)  Date:  03/15/2017  Time:  9:35 PM  Type of Therapy:  Evening Wrap-up Group  Participation Level:  None  Participation Quality:  Drowsy  Affect:  Flat  Cognitive:  Drowsy  Insight:  None  Engagement in Group:  None  Modes of Intervention:  Activity and Discussion  Summary of Progress/Problems:  Tomasita MorrowChelsea Nanta Orla Estrin 03/15/2017, 9:35 PM

## 2017-03-15 NOTE — ED Notes (Signed)
[facility] Camera placed in room and explained to pt who verbalizes understanding.

## 2017-03-15 NOTE — ED Notes (Signed)
BEHAVIORAL HEALTH ROUNDING Patient sleeping: Yes.   Patient alert and oriented: not applicable SLEEPING Behavior appropriate: Yes.  ; If no, describe: SLEEPING Nutrition and fluids offered: No SLEEPING Toileting and hygiene offered: NoSLEEPING Sitter present: not applicable, Q 15 min safety rounds and observation via security camera. Law enforcement present: Yes ODS 

## 2017-03-15 NOTE — ED Notes (Signed)
Patient asleep in room. No noted distress or abnormal behavior. Will continue 15 minute checks and observation by security cameras for safety. 

## 2017-03-15 NOTE — BH Assessment (Signed)
Assessment Note  [name] is an 46 y.o. female. [name] states that she arrived to the ED by way of transportation by her boyfriend.  She states that she came in to the ED for an enema because she is constipated.  [name] was unable to answer questions directly.  She laughed inappropriately. Upon TTS arrival at bedside, She sat up abruptly and exclaimed "Boo" and began laughing.  She denied auditory or visual hallucinations.  She denied suicidal or homicidal ideation or intent. She would make random statements to no one and laugh.  [name] was bizarre acting, and was unable to answer many questions.  She appeared to be responding to internal stimuli.   IVC paperwork reports "Patient brought in by boyfriend who says she is having 2 personalities.  In the ER, she says she wants to talk to her son because he is the devil.  TTS contacted [name], at [phone] (boyfriend, who brought her in) no one answered the phone, no identifying information was provided on the recording. No message was left  Diagnosis: Altered Mental Status  Past Medical History:  Past Medical History:  Diagnosis Date  . Hyperlipemia   . Other bipolar disorders   . Other malaise and fatigue   . Unspecified hypothyroidism     Past Surgical History:  Procedure Laterality Date  . radioablation of thyroid Bilateral     Family History:  Family History  Problem Relation Age of Onset  . Mental illness Mother   . Bipolar disorder Mother   . Hypertension Father   . Hypertension Sister   . Depression Brother   . Hypertension Sister   . Alzheimer's disease Maternal Grandmother   . Heart attack Maternal Grandfather     Social History:  reports that she quit smoking about 37 years ago. she has never used smokeless tobacco. She reports that she does not drink alcohol or use drugs.  Additional Social History:  Alcohol / Drug Use History of alcohol / drug use?: No history of alcohol / drug abuse  CIWA:  CIWA-Ar BP: 138/85 Pulse Rate: (!) 122 COWS:    Allergies: No Known Allergies  Home Medications:  (Not in a hospital admission)  OB/GYN Status:  No LMP recorded. Patient has had an injection.  General Assessment Data [facility] TTS Assessment: In system Is this a Tele or Face-to-Face Assessment?: Face-to-Face Is this an Initial Assessment or a Re-assessment for this encounter?: Initial Assessment Marital status: Single Maiden name: n/a Is patient pregnant?: No Pregnancy Status: Unknown Living Arrangements: Non-relatives/Friends Can pt return to current living arrangement?: Yes Admission Status: Involuntary Is patient capable of signing voluntary admission?: Yes Referral Source: Self/Family/Friend  Medical Screening Exam [facility] Walk-in ONLY) Medical Exam completed: Yes  Crisis Care Plan Living Arrangements: Non-relatives/Friends Legal Guardian: Other:(Self) Name of Psychiatrist: Dr. Linton RumpAmy bedsel Name of Therapist: none  Education Status Is patient currently in school?: No Current Grade: n/a Highest grade of school patient has completed: 12th Name of school: [employer] Contact person: n/a  Risk to self with the past 6 months Suicidal Ideation: No Has patient been a risk to self within the past 6 months prior to admission? : No Suicidal Intent: No Has patient had any suicidal intent within the past 6 months prior to admission? : No Is patient at risk for suicide?: No Suicidal Plan?: No Has patient had any suicidal plan within the past 6 months prior to admission? : No Access to Means: No What  has been your use of drugs/alcohol within the last 12 months?: Unable to assess("We do every drug') Previous Attempts/Gestures: No How many times?: 0 Other Self Harm Risks: denied Triggers for Past Attempts: None known Intentional Self Injurious Behavior: None Family Suicide History: Unknown Recent stressful life event(s):  (unknown) Persecutory voices/beliefs?: (unknown) Depression: No Depression Symptoms: (n/a) Substance abuse history and/or treatment for substance abuse?: No Suicide prevention information given to non-admitted patients: Not applicable  Risk to Others within the past 6 months Homicidal Ideation: No Does patient have any lifetime risk of violence toward others beyond the six months prior to admission? : No Thoughts of Harm to Others: No Current Homicidal Intent: No Current Homicidal Plan: No Access to Homicidal Means: No Identified Victim: None identified History of harm to others?: No Assessment of Violence: None Noted Does patient have access to weapons?: No Criminal Charges Pending?: No Does patient have a court date: No Is patient on probation?: Unknown  Psychosis Hallucinations: (Unknown) Delusions: (unknown)  Mental Status Report Appearance/Hygiene: In scrubs Eye Contact: Poor Motor Activity: Unremarkable Speech: Incoherent Level of Consciousness: Alert Mood: Pleasant Affect: Silly Anxiety Level: None Thought Processes: Flight of Ideas, Irrelevant Judgement: Impaired Orientation: Not oriented Obsessive Compulsive Thoughts/Behaviors: None  Cognitive Functioning Concentration: Unable to Assess Memory: Unable to Assess IQ: (unable to assess) Insight: Poor Impulse Control: Unable to Assess Appetite: (Unable to assess) Sleep: Unable to Assess Vegetative Symptoms: Unable to Assess  ADLScreening [facility] Assessment Services) Patient's cognitive ability adequate to safely complete daily activities?: No Patient able to express need for assistance with ADLs?: Yes Independently performs ADLs?: Yes (appropriate for developmental age)  Prior Inpatient Therapy Prior Inpatient Therapy: Yes Prior Therapy Dates: 2016 [facility] Reason for Treatment: Bipolar Disorder, Psychosis  Prior Outpatient Therapy Prior Outpatient Therapy:  Yes(Unknown) Prior Therapy Dates: 2017 [facility] Reason for Treatment: Bipolar Disorder Does patient have an ACCT team?: No Does patient have Intensive In-House Services?  : No Does patient have [facility] services? : No Does patient have P4CC services?: No  ADL Screening (condition at time of admission) Patient's cognitive ability adequate to safely complete daily activities?: No Is the patient deaf or have difficulty hearing?: No Does the patient have difficulty seeing, even when wearing glasses/contacts?: No Does the patient have difficulty concentrating, remembering, or making decisions?: Yes Patient able to express need for assistance with ADLs?: Yes Does the patient have difficulty dressing or bathing?: No Independently performs ADLs?: Yes (appropriate for developmental age) Does the patient have difficulty walking or climbing stairs?: (unable to assess) Weakness of Legs: (Unable to assess)             Advance Directives (For Healthcare) Does Patient Have a Medical Advance Directive?: No Would patient like information on creating a medical advance directive?: No - Patient declined    Additional Information 1:1 In Past 12 Months?: No CIRT Risk: No Elopement Risk: No Does patient have medical clearance?: No     Disposition:  Disposition Initial Assessment Completed for this Encounter: Yes Disposition of Patient: Pending Review with psychiatrist  On Site Evaluation by:   Reviewed with Physician:    Justice DeedsKeisha Ellamae Lybeck 03/15/2017 12:13 AM
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
## 2017-03-15 NOTE — ED Notes (Addendum)
Pt is psychotic. Stood up from bed, took a few steps, stood still and then fell backwards onto the floor. Pt's head hit the bottom of the bed. Patient assisted back to bed. EDP notified and examined patient. Head CT ordered. Will continue to monitor. Maintained on 15 minute checks and observation by security camera for safety.

## 2017-03-15 NOTE — ED Notes (Signed)
BEHAVIORAL HEALTH ROUNDING  Patient sleeping: No.  Patient alert and oriented: yes  Behavior appropriate: no If no, describe: wandering into others room. Easily redirected.  Nutrition and fluids offered: Yes  Toileting and hygiene offered: Yes  Sitter present: not applicable, Q 15 min safety rounds and observation via security camera. Law enforcement present: Yes ODS

## 2017-03-15 NOTE — ED Notes (Signed)
Pt eating lunch with blanket overhead. Pt is in no distress. Maintained on 15 minute checks and observation by security camera for safety.

## 2017-03-15 NOTE — ED Notes (Signed)
Pt wandered into another patients room. Again pt instructed not to enter other patients room. Pt had sit down on the bed then stated she could not move saying "Can ya'll drag my dead lifeless body out of here?" Pt was assisted back to her room. Will continue to monitor.

## 2017-03-15 NOTE — ED Notes (Signed)
While giving report to oncoming shift, pt was noted to be removing her clothes and standing in her doorway. This RN and Everardo PacificKenisha, RN went to room and covered pt with a blanket. Pt assisted up with pulling her pants up and moved to room 6 to prevent pt from exposing herself to other patients. Pt clothes placed back on pt.

## 2017-03-15 NOTE — ED Notes (Signed)
Patient resting quietly in room. No noted distress or abnormal behaviors noted. Will continue 15 minute checks and observation by security camera for safety. 

## 2017-03-15 NOTE — ED Notes (Signed)
[facility] by other RN.

## 2017-03-15 NOTE — ED Notes (Signed)
ENVIRONMENTAL ASSESSMENT  Potentially harmful objects out of patient reach: Yes.  Personal belongings secured: Yes.  Patient dressed in hospital provided attire only: Yes.  Plastic bags out of patient reach: Yes.  Patient care equipment (cords, cables, call bells, lines, and drains) shortened, removed, or accounted for: Yes.  Equipment and supplies removed from bottom of stretcher: Yes.  Potentially toxic materials out of patient reach: Yes.  Sharps container removed or out of patient reach: Yes.   BEHAVIORAL HEALTH ROUNDING  Patient sleeping: No.  Patient alert and oriented: yes  Behavior appropriate: Yes. ; If no, describe:  Nutrition and fluids offered: Yes  Toileting and hygiene offered: Yes  Sitter present: not applicable, Q 15 min safety rounds and observation via security camera. Law enforcement present: Yes ODS  Pt brought into ED BHU via sally port and wand with metal detector for safety by ODS officer. Patient oriented to unit/care area: Pt informed of unit policies and procedures.  Informed that, for their safety, care areas are designed for safety and monitored by security cameras at all times; and visiting hours explained to patient. Patient verbalizes understanding, and verbal contract for safety obtained.Pt shown to their room.   ED BHU PLACEMENT JUSTIFICATION  Is the patient under IVC or is there intent for IVC: Yes.  Is the patient medically cleared: Yes.  Is there vacancy in the ED BHU: Yes.  Is the population mix appropriate for patient: Yes.  Is the patient awaiting placement in inpatient or outpatient setting: pending psych consult Has the patient had a psychiatric consult: pending Survey of unit performed for contraband, proper placement and condition of furniture, tampering with fixtures in bathroom, shower, and each patient room: Yes. ; Findings: All clear  APPEARANCE/BEHAVIOR  calm, cooperative and adequate rapport can be established  NEURO ASSESSMENT   Orientation: time, place and person  Hallucinations: No.None noted (Hallucinations)  Speech: Normal  Gait: normal  RESPIRATORY ASSESSMENT  WNL  CARDIOVASCULAR ASSESSMENT  WNL  GASTROINTESTINAL ASSESSMENT  WNL  EXTREMITIES  WNL  PLAN OF CARE  Provide calm/safe environment. Vital signs assessed TID. ED BHU Assessment once each 12-hour shift. Collaborate with TTS daily or as condition indicates. Assure the ED provider has rounded once each shift. Provide and encourage hygiene. Provide redirection as needed. Assess for escalating behavior; address immediately and inform ED provider.  Assess family dynamic and appropriateness for visitation as needed: Yes. ; If necessary, describe findings:  Educate the patient/family about BHU procedures/visitation: Yes. ; If necessary, describe findings: Pt is calm and cooperative at this time. Pt understanding and accepting of unit procedures/rules. Will continue to monitor with Q 15 min safety rounds and observation via security camera.   Pt denies A/VH, SI or HI but states "I'm in heaven" but is also able to verbalize that she is at the hospital.

## 2017-03-15 NOTE — ED Provider Notes (Addendum)
-----------------------------------------   7:27 AM on 03/15/2017 -----------------------------------------   Blood pressure 133/77, pulse (!) 112, temperature 98.2 F (36.8 C), temperature source Oral, resp. rate 20, height 5\' 1"  (1.549 m), weight 72.6 kg (160 lb), SpO2 98 %.  The patient had no acute events since last update.  Calm and cooperative at this time.  Disposition is pending Psychiatry/Behavioral Medicine team recommendations.     ----------------------------------------- 8:20 AM on 03/15/2017 -----------------------------------------  Fit of agitation the patient stood up on her bed and jumped to the ground hitting her head.  There was no LOC.  Patient her high risk presentation and the fact that she just received multiple calming agents limiting her exam will order CT scan to evaluate for traumatic injury.    Willy Eddyobinson, Ott Zimmerle, MD 03/15/17 [phone]

## 2017-03-15 NOTE — Plan of Care (Signed)
NEW ADMISSION 

## 2017-03-15 NOTE — ED Notes (Signed)
Pt came out of her room, heading for another patient. "I want to eat her."  Pt re-directed by security and RN.  Pt then told security, "You are warm. Are you gonna jump on me?"  Pt brought to her room and laid down on bed. Will continue to monitor. Maintained on 15 minute checks and observation by security camera for safety.

## 2017-03-15 NOTE — Progress Notes (Signed)
Admission Note: From ER  [name]  D: Pt appeared depressed  With  a flat affect.  Pt  denies SI  at this time.  Noted very sleepy  During interview  Continue to talk about her boyfriend . Periods of crying during interview. Voice of being here for her mood .  Patient  Lives with son Patient does not smoke  , drinks occasionally . Employed as Engineer, siteschool teacher  Pt is redirectable and cooperative with assessment.      A: Pt admitted to unit per protocol, skin assessment stated she fell in ER  Noted some swelling in top of her head and search done  With McDonald's Corporationerry RN and no contraband found.  Pt  educated on therapeutic milieu rules. Pt was introduced to milieu by nursing staff.    R: Pt was receptive to education about the milieu .  15 min safety checks started. Clinical research associatewriter offered support

## 2017-03-15 NOTE — BH Assessment (Signed)
[facility] Amend[facility]acs.  Attending Physician will be Dr. Jennet MaduroPucilowska.   Patient has [facility] room 307[facility].   ER staff is aware of the admission [name]( Nitcha, ER Sect.; Dr. Roxan Hockeyobinson, ER MD; Amy, Patient's Nurse & Josh Patient Access).

## 2017-03-15 NOTE — ED Notes (Signed)
Pt up and walked into the room across the hall from her room. This RN and ODS guard redirected pt back to her room. Pt again informed to not enter anyone elses room. Pt asked if she was in hell. Explained to pt that she is at the hospital. Pt got on the bed and laid back. This RN covered pt with a blanket and pt stated "Thank you Dewayne Hatchnn" (this writers name).

## 2017-03-15 NOTE — ED Notes (Addendum)
Pt speaking with Dr. Toni Amendlapacs. Pt calm. Maintained on 15 minute checks and observation by security camera for safety.

## 2017-03-15 NOTE — ED Notes (Signed)
Pt to door crying, moaning.  Pt re-directed to bed. Patitent asking staff to find [facility] Pchannon. "He is a man with one eye near my apartment complex. I hug on him on the outside."  Pt crying. Pt not wanting to be alone. Pt reassured she was not alone and staff would check on her frequently. TV turned on. Maintained on 15 minute checks and observation by security camera for safety.

## 2017-03-15 NOTE — ED Notes (Signed)
BEHAVIORAL HEALTH ROUNDING  Patient sleeping: No.  Patient alert and oriented: yes  Behavior appropriate: Yes. ; If no, describe:  Nutrition and fluids offered: Yes  Toileting and hygiene offered: Yes  Sitter present: not applicable, Q 15 min safety rounds and observation via security camera. Law enforcement present: Yes ODS  

## 2017-03-15 NOTE — Tx Team (Signed)
Initial Treatment Plan 03/15/2017 4:26 PM [name] [name] ZOX:[id number]    PATIENT STRESSORS: Financial difficulties Medication change or noncompliance   PATIENT STRENGTHS: Ability for insight Average or above average intelligence Capable of independent living Communication skills Supportive family/friends   PATIENT IDENTIFIED PROBLEMS: psychosis 03/15/17  Financial 03/15/17                   DISCHARGE CRITERIA:  Ability to meet basic life and health needs Adequate post-discharge living arrangements Improved stabilization in mood, thinking, and/or behavior Motivation to continue treatment in a less acute level of care  PRELIMINARY DISCHARGE PLAN: Attend aftercare/continuing care group Outpatient therapy Return to previous living arrangement  PATIENT/FAMILY INVOLVEMENT: This treatment plan has been presented to and reviewed with the patient, [name], and/or family member,   The patient and family have been given the opportunity to ask questions and make suggestions.  Crist InfanteGwen A Hani Campusano, RN 03/15/2017, 4:26 PM

## 2017-03-15 NOTE — ED Notes (Signed)
Pt given lunch tray. Compliant with medication ordered. Maintained on 15 minute checks and observation by security camera for safety.

## 2017-03-15 NOTE — ED Notes (Signed)
Pt to be transferred to BMU under IVC.  [facility]. All belongings will be sent with patient.

## 2017-03-16 DIAGNOSIS — F3164 Bipolar disorder, current episode mixed, severe, with psychotic features: Principal | ICD-10-CM

## 2017-03-16 LAB — LIPID PANEL
CHOL/HDL RATIO: 4 ratio
CHOLESTEROL: 174 mg/dL (ref 0–200)
HDL: 44 mg/dL (ref >40)
LDL Cholesterol: 111 mg/dL — ABNORMAL HIGH (ref 0–99)
TRIGLYCERIDES: 97 mg/dL (ref <150)
VLDL: 19 mg/dL (ref 0–40)

## 2017-03-16 LAB — HEMOGLOBIN A1C
Hgb A1c MFr Bld: 5.8 % — ABNORMAL HIGH (ref 4.8–5.6)
Mean Plasma Glucose: 119.76 mg/dL

## 2017-03-16 LAB — TSH: TSH: 1.024 u[IU]/mL (ref 0.350–4.500)

## 2017-03-16 MED ORDER — ZOLPIDEM TARTRATE 5 MG PO TABS: 5.0000 mg | ORAL_TABLET | Freq: Every evening | ORAL | Status: DC | PRN

## 2017-03-16 MED ORDER — LURASIDONE HCL 40 MG PO TABS
40.0000 mg | ORAL_TABLET | Freq: Every day | ORAL | Status: DC
  Administered 2017-03-16: 40 mg via ORAL
  Filled 2017-03-16 (×2): qty 1

## 2017-03-16 MED ORDER — LITHIUM CARBONATE 300 MG PO CAPS
300.0000 mg | ORAL_CAPSULE | Freq: Three times a day (TID) | ORAL | Status: DC
  Administered 2017-03-16 – 2017-03-21 (×16): 300 mg via ORAL
  Filled 2017-03-16 (×16): qty 1

## 2017-03-16 NOTE — [facility] Group Notes (Signed)
LCSW Group Therapy Note   03/16/2017 1:00pm   Type of Therapy and Topic:  Group Therapy:  Emotion Regulation   Participation Level:  Did Not Attend  Description of Group: The purpose of this group is to assist patients in learning to regulate negative emotions and experience positive emotions.  Patients will be guided to discuss ways in which they have been vulnerable to their negative emotions.  These vulnerabilities will be juxtaposed with experiences of positive emotions or situations, and patients will be challenged to use positive emotions to combat negative ones.  Special emphasis will be placed on coping with negative emotions in conflict situations, and patients will process healthy conflict resolution skills.  Therapeutic Goals 1.  Patient will identify two positive emotions or experiences to reflect on in order to balance out negative emotions. 2. Patient will label two or more emotions that they find the most difficult to experience. 3. Patient will demonstrate positive conflict resolution skills through discussion and/or role plays.  Summary of Patient Progress:    Therapeutic Modalities Cognitive Behavioral Therapy Feelings Identification Dialectical Behavioral Therapy  Alease FrameSonya S Colie Fugitt, LCSW 03/16/2017 5:54 PM

## 2017-03-16 NOTE — [facility] Suicide Risk Assessment (Signed)
[facility]   Nursing information obtained from:  Patient Demographic factors:  Divorced or widowed Current Mental Status:  NA Loss Factors:  NA Historical Factors:    Risk Reduction Factors:     Total Time spent with patient: 1 hour Principal Problem: Bipolar I disorder, most recent episode mixed, severe with psychotic features [facility](HCC) Diagnosis:   Patient Active Problem List   Diagnosis Date Noted  . Noncompliance [Z91.19] 03/15/2017  . Head trauma [S09.90XA] 03/15/2017  . Bipolar I disorder, most recent episode mixed, severe with psychotic features (HCC) [F31.64] 03/15/2017  . Viral URI with cough [J06.9, B97.89] 07/23/2016  . Abnormal urine odor [R82.90] 07/23/2016  . Vitamin D deficiency [E55.9] 04/23/2016  . Obesity (BMI 30.0-34.9) [E66.9] 04/23/2016  . Contraception management [Z30.9] 04/23/2016  . Bipolar 1 disorder, mixed (HCC) [F31.60] 10/14/2014  . H/O hypercholesterolemia [Z86.39] 10/14/2014  . H/O: hypothyroidism [Z86.39] 10/14/2014  . Counseling for guardian-child conflict [Z62.820] 10/14/2014  . Thyroid goiter [E04.9] 10/14/2014  . FATIGUE, CHRONIC [R53.81, R53.83] 01/25/2007   Subjective Data: psychotic break  Continued Clinical Symptoms:  Alcohol Use Disorder Identification Test Final Score (AUDIT): 1 The "Alcohol Use Disorders Identification Test", Guidelines for Use in Primary Care, Second Edition.  World Science writerHealth Organization [facility](WHO). Score between 0-7:  no or low risk or alcohol related problems. Score between 8-15:  moderate risk of alcohol related problems. Score between 16-19:  high risk of alcohol related problems. Score 20 or above:  warrants further diagnostic evaluation for alcohol dependence and treatment.   CLINICAL FACTORS:   Bipolar Disorder:   Mixed State Currently Psychotic   Musculoskeletal: Strength & Muscle Tone: within normal limits Gait & Station: normal Patient leans: N/A  Psychiatric Specialty  Exam: Physical Exam  Nursing note and vitals reviewed. Psychiatric: Her speech is normal and behavior is normal. Her affect is labile and inappropriate. Thought content is paranoid and delusional. Cognition and memory are normal. She expresses impulsivity.    Review of Systems  Neurological: Negative.   Psychiatric/Behavioral: The patient has insomnia.   All other systems reviewed and are negative.   Blood pressure 115/81, pulse (!) 108, temperature 98.8 F (37.1 C), temperature source Oral, resp. rate 18, height 5\' 2"  (1.575 m), weight 75.8 kg (167 lb), SpO2 99 %.Body mass index is 30.54 kg/m.  General Appearance: Casual  Eye Contact:  Good  Speech:  Normal Rate  Volume:  Normal  Mood:  Euphoric  Affect:  Congruent  Thought Process:  Goal Directed and Descriptions of Associations: Intact  Orientation:  Full (Time, Place, and Person)  Thought Content:  Delusions, Hallucinations: Auditory and Paranoid Ideation  Suicidal Thoughts:  No  Homicidal Thoughts:  No  Memory:  Immediate;   Fair Recent;   Fair Remote;   Fair  Judgement:  Poor  Insight:  Lacking  Psychomotor Activity:  Normal  Concentration:  Concentration: Fair and Attention Span: Fair  Recall:  [employer]Fair  Fund of Knowledge:  Fair  Language:  Fair  Akathisia:  No  Handed:  Right  AIMS (if indicated):     Assets:  Communication Skills Desire for Improvement Financial Resources/Insurance Housing Intimacy Physical Health Resilience Social Support Talents/Skills Transportation Vocational/Educational  ADL's:  Intact  Cognition:  WNL  Sleep:  Number of Hours: 4.15      COGNITIVE FEATURES THAT CONTRIBUTE TO RISK:  None    SUICIDE RISK:   Moderate:  Frequent suicidal ideation with limited intensity, and duration, some specificity in terms of plans,  no associated intent, good self-control, limited dysphoria/symptomatology, some risk factors present, and identifiable protective factors, including available and  accessible social support.  PLAN OF CARE: hospital admission, medication management, discharge planning.  [name] is a 46 year old female with a history of bipolar disorder admitted floridly psychotic in a mixed episode.  #Agitation, resolved -Ativan and Geodon are available  #Mood and psychosis -start Latuda 40 mg with supper -start Lithium 300 mg TID -continue Tegretol for now  #Insomnia -Ambien is available  #Metabolic syndrome monitoring -Lipid panel, TSH and HgbA1C are pending -EKG pending -Pregnancy test is negative  #Disposition -discharge with family -needs mental health follow up    I certify that inpatient services furnished can reasonably be expected to improve the patient's condition.   Kristine LineaJolanta Mykaila Blunck, MD 03/16/2017, 11:40 AM

## 2017-03-16 NOTE — Progress Notes (Signed)
Recreation Therapy Notes  INPATIENT RECREATION THERAPY ASSESSMENT  Patient Details Name: Krista HabermannChelsea Cattouse Wilson MRN: [id number] DOB: 29-Apr-1971 Today's Date: 03/16/2017  Patient Stressors: Family  Coping Skills:   Isolate, Other (Comment)(Read, Write, Drawing)  Engineer, building servicesersonal Challenges: Concentration, Decision-Making, Self-Esteem/Confidence, Stress Management  Leisure Interests (2+):  Exercise - Aerobics Class  Awareness of Community Resources:  Yes  Community Resources:  [employer], [place]  Current Use: Yes  If no, Barriers?:  None  Patient Strengths:  Compassion, Singing  Patient Identified Areas of Improvement:  Putting self first and find balance and more relaxation time  Current Recreation Participation:  Exercise  Patient Goal for Hospitalization:  To gain a balanced life  [place]  County of Residence:  [place]   Current SI (including self-harm):  No  Current HI:  No  Consent to Intern Participation: N/A   Myalee Stengel 03/16/2017, 3:27 PM

## 2017-03-16 NOTE — Tx Team (Signed)
Interdisciplinary Treatment and Diagnostic Plan Update  03/16/2017 Time of Session: [address] [place] MRN: [id number]  Principal Diagnosis: Bipolar I disorder, most recent episode mixed, severe with psychotic features ([place])  Secondary Diagnoses: Principal Problem:   Bipolar I disorder, most recent episode mixed, severe with psychotic features ([place]) Active Problems:   Bipolar 1 disorder, mixed ([place])   Noncompliance   Current Medications:  Current Facility-Administered Medications  Medication Dose Route Frequency Provider Last Rate Last Dose  . acetaminophen (TYLENOL) tablet 650 mg  650 mg Oral Q6H PRN [name], [name], MD      . alum & mag hydroxide-simeth (MAALOX/MYLANTA) 200-200-20 MG/5ML suspension 30 mL  30 mL Oral Q4H PRN [name], [name], MD      . carbamazepine (TEGRETOL) tablet 200 mg  200 mg Oral BID [name], [name], MD   200 mg at 03/16/17 0747  . lithium carbonate capsule 300 mg  300 mg Oral TID WC Pucilowska, Jolanta B, MD   300 mg at 03/16/17 1221  . LORazepam (ATIVAN) tablet 2 mg  2 mg Oral Q6H PRN [name], [name], MD   2 mg at 03/15/17 2335  . lurasidone (LATUDA) tablet 40 mg  40 mg Oral Q supper Pucilowska, Jolanta B, MD      . magnesium hydroxide (MILK OF MAGNESIA) suspension 30 mL  30 mL Oral Daily PRN [name], [name], MD      . ziprasidone (GEODON) injection 20 mg  20 mg Intramuscular Q12H PRN [name], [name], MD      . zolpidem (AMBIEN) tablet 5 mg  5 mg Oral QHS PRN Pucilowska, Jolanta B, MD       PTA Medications: Medications Prior to Admission  Medication Sig Dispense Refill Last Dose  . medroxyPROGESTERone (DEPO-PROVERA) 150 MG/ML injection Inject 150 mg into the muscle every 3 (three) months.   Taking    Patient Stressors: Financial difficulties Medication change or noncompliance  Patient Strengths: Ability for insight Average or above average intelligence Capable of independent living Communication skills Supportive family/friends  Treatment  Modalities: Medication Management, Group therapy, Case management,  1 to 1 session with clinician, Psychoeducation, Recreational therapy.   Physician Treatment Plan for Primary Diagnosis: Bipolar I disorder, most recent episode mixed, severe with psychotic features ([place]) Long Term Goal(s): Improvement in symptoms so as ready for discharge NA   Short Term Goals: Ability to identify changes in lifestyle to reduce recurrence of condition will improve Ability to verbalize feelings will improve Ability to disclose and discuss suicidal ideas Ability to demonstrate self-control will improve Ability to identify and develop effective coping behaviors will improve Compliance with prescribed medications will improve Ability to identify triggers associated with substance abuse/mental health issues will improve NA  Medication Management: Evaluate patient's response, side effects, and tolerance of medication regimen.  Therapeutic Interventions: 1 to 1 sessions, Unit Group sessions and Medication administration.  Evaluation of Outcomes: Not Met  Physician Treatment Plan for Secondary Diagnosis: Principal Problem:   Bipolar I disorder, most recent episode mixed, severe with psychotic features ([place]) Active Problems:   Bipolar 1 disorder, mixed ([place])   Noncompliance  Long Term Goal(s): Improvement in symptoms so as ready for discharge NA   Short Term Goals: Ability to identify changes in lifestyle to reduce recurrence of condition will improve Ability to verbalize feelings will improve Ability to disclose and discuss suicidal ideas Ability to demonstrate self-control will improve Ability to identify and develop effective coping behaviors will improve Compliance with prescribed medications  will improve Ability to identify triggers associated with substance abuse/mental health issues will improve NA     Medication Management: Evaluate patient's response, side effects, and tolerance of medication  regimen.  Therapeutic Interventions: 1 to 1 sessions, Unit Group sessions and Medication administration.  Evaluation of Outcomes: Not Met   RN Treatment Plan for Primary Diagnosis: Bipolar I disorder, most recent episode mixed, severe with psychotic features ([place]) Long Term Goal(s): Knowledge of disease and therapeutic regimen to maintain health will improve  Short Term Goals: Ability to verbalize feelings will improve, Ability to identify and develop effective coping behaviors will improve and Compliance with prescribed medications will improve  Medication Management: RN will administer medications as ordered by provider, will assess and evaluate patient's response and provide education to patient for prescribed medication. RN will report any adverse and/or side effects to prescribing provider.  Therapeutic Interventions: 1 on 1 counseling sessions, Psychoeducation, Medication administration, Evaluate responses to treatment, Monitor vital signs and CBGs as ordered, Perform/monitor CIWA, COWS, AIMS and Fall Risk screenings as ordered, Perform wound care treatments as ordered.  Evaluation of Outcomes: Not Met   LCSW Treatment Plan for Primary Diagnosis: Bipolar I disorder, most recent episode mixed, severe with psychotic features ([place]) Long Term Goal(s): Safe transition to appropriate next level of care at discharge, Engage patient in therapeutic group addressing interpersonal concerns.  Short Term Goals: Engage patient in aftercare planning with referrals and resources, Increase social support and Increase skills for wellness and recovery  Therapeutic Interventions: Assess for all discharge needs, 1 to 1 time with Social worker, Explore available resources and support systems, Assess for adequacy in community support network, Educate family and significant other(s) on suicide prevention, Complete Psychosocial Assessment, Interpersonal group therapy.  Evaluation of Outcomes: Not  Met   Progress in Treatment: Attending groups: Yes. Participating in groups: Yes. Taking medication as prescribed: Yes. Toleration medication: Yes. Family/Significant other contact made: No, will contact:  boyfriend Patient understands diagnosis: Yes. Discussing patient identified problems/goals with staff: Yes. Medical problems stabilized or resolved: Yes. Denies suicidal/homicidal ideation: Yes. Issues/concerns per patient self-inventory: No. Other: none  New problem(s) identified: No, Describe:  none  New Short Term/Long Term Goal(s):Pt goal is "to complete the steps that need to be taken to resume a healthy lifestyle."  Discharge Plan or Barriers: Pt will resume medication services with Dr. Gretel Acre, [place].  Reason for Continuation of Hospitalization: [place] Medication stabilization  Estimated Length of Stay:5-7 days.  Attendees: Patient:[name] 03/16/2017   Physician: Dr. Bary Leriche, MD 03/16/2017   Nursing: Polly Cobia, RN 03/16/2017   RN Care Manager: 03/16/2017   Social Worker: Lurline Idol, LCSW 03/16/2017   Recreational Therapist:  03/16/2017   Other:  03/16/2017   Other:  03/16/2017   Other: 03/16/2017     Scribe for Treatment Team: [name], [place] 03/16/2017 3:10 PM

## 2017-03-16 NOTE — Plan of Care (Signed)
Patient appears flat and depressed during interaction. Reports depression "1" (0-10), anxiety "3" (0-10). Denies any emotional impairments, however statements made by patient are not congruent with observations made by this Clinical research associatewriter. Denies SI/HI/AVH at this time. Contracts for safety at this time.

## 2017-03-16 NOTE — H&P (Signed)
Psychiatric Admission Assessment Adult  Patient Identification: [name] MRN:  [id number] Date of Evaluation:  03/16/2017 Chief Complaint:  bipolar 1 disorder Principal Diagnosis: Bipolar I disorder, most recent episode mixed, severe with psychotic features (HCC) Diagnosis:   Patient Active Problem List   Diagnosis Date Noted  . Noncompliance [Z91.19] 03/15/2017  . Head trauma [S09.90XA] 03/15/2017  . Bipolar I disorder, most recent episode mixed, severe with psychotic features (HCC) [F31.64] 03/15/2017  . Viral URI with cough [J06.9, B97.89] 07/23/2016  . Abnormal urine odor [R82.90] 07/23/2016  . Vitamin D deficiency [E55.9] 04/23/2016  . Obesity (BMI 30.0-34.9) [E66.9] 04/23/2016  . Contraception management [Z30.9] 04/23/2016  . Bipolar 1 disorder, mixed (HCC) [F31.60] 10/14/2014  . H/O hypercholesterolemia [Z86.39] 10/14/2014  . H/O: hypothyroidism [Z86.39] 10/14/2014  . Counseling for guardian-child conflict [Z62.820] 10/14/2014  . Thyroid goiter [E04.9] 10/14/2014  . FATIGUE, CHRONIC [R53.81, R53.83] 01/25/2007   History of Present Illness:  Identifying data. Ms. [name] is a 46 year old female with a history of bipolar disorder.  Chief complains. "My boyfriend said it's time to go."  History of present illness. Information was obtained from the patient and the chart. The patient was brought to the ER by her boyfirend for agitated, disorganized behavior, paranoia and hallucinations. 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 The patient was unaware of her symptoms but admits to insomnia, auditory hallucinations and loss of control over her behavior. In the ER, she was agitated, behaved like a baby, urinated on herself, disrobed and fell on her head as if she could not walk. Head CT scan was negative. She denies depressive  symptoms but was very tearful at times in the ER. She denies anxiety or illicit substance use.   On interview today she is pleasant. She recognizes me from previous admission and is embarrassed by her earlier behavior.  Past psychiatric history. She was diagnosed with bipolar at the age of 67. She has had several manic episodes that respond well to treatment but never takes any medications in between episodes. She had two suicide attempts. She was tried on Lithium, Depakote, Tegretol, Zyprexa, Geodon and Latuda. Most of them caused unacceptable side effects, mostly sedation.  Family psychiatric history. Mother with undiagnosed bipolar. Her 16-uear-old son was just diagnosed bipolar last week and started on Latuda.  Social history. She is divorced. Her two daughters live with their father. She lives with her 69 year old son and a suportive boyfirend. She has masters degree in education and works as a first Merchant navy officer.   Total Time spent with patient: 1 hour  Is the patient at risk to self? No.  Has the patient been a risk to self in the past 6 months? No.  Has the patient been a risk to self within the distant past? Yes.    Is the patient a risk to others? No.  Has the patient been a risk to others in the past 6 months? No.  Has the patient been a risk to others within the distant past? No.   Prior Inpatient Therapy:   Prior Outpatient Therapy:    Alcohol Screening: 1. How often do you have a drink containing alcohol?: Monthly or less 2. How many drinks containing alcohol do you have on a typical day when you are drinking?: 1 or 2 3. How often do you have six or more drinks  on one occasion?: Never AUDIT-C Score: 1 4. How often during the last year have you found that you were not able to stop drinking once you had started?: Never 5. How often during the last year have you failed to do what was normally expected from you becasue of drinking?: Never 6. How often during the last year have  you needed a first drink in the morning to get yourself going after a heavy drinking session?: Never 7. How often during the last year have you had a feeling of guilt of remorse after drinking?: Never 8. How often during the last year have you been unable to remember what happened the night before because you had been drinking?: Never 9. Have you or someone else been injured as a result of your drinking?: No 10. Has a relative or friend or a doctor or another health worker been concerned about your drinking or suggested you cut down?: No Alcohol Use Disorder Identification Test Final Score (AUDIT): 1 Intervention/Follow-up: AUDIT Score <7 follow-up not indicated Substance Abuse History in the last 12 months:  No. Consequences of Substance Abuse: NA Previous Psychotropic Medications: Yes  Psychological Evaluations: No  Past Medical History:  Past Medical History:  Diagnosis Date  . Hyperlipemia   . Other bipolar disorders   . Other malaise and fatigue   . Unspecified hypothyroidism     Past Surgical History:  Procedure Laterality Date  . radioablation of thyroid Bilateral    Family History:  Family History  Problem Relation Age of Onset  . Mental illness Mother   . Bipolar disorder Mother   . Hypertension Father   . Hypertension Sister   . Depression Brother   . Hypertension Sister   . Alzheimer's disease Maternal Grandmother   . Heart attack Maternal Grandfather     Tobacco Screening: Have you used any form of tobacco in the last 30 days? (Cigarettes, Smokeless Tobacco, Cigars, and/or Pipes): No Social History:  Social History   Substance and Sexual Activity  Alcohol Use No  . Alcohol/week: 0.0 oz     Social History   Substance and Sexual Activity  Drug Use No    Additional Social History:                           Allergies:  No Known Allergies Lab Results:  Results for orders placed or performed during the hospital encounter of 03/15/17 (from the  past 48 hour(s))  Urine Drug Screen, Qualitative     Status: Abnormal   Collection Time: 03/15/17  6:15 PM  Result Value Ref Range   Tricyclic, Ur Screen NONE DETECTED NONE DETECTED   Amphetamines, Ur Screen NONE DETECTED NONE DETECTED   MDMA (Ecstasy)Ur Screen NONE DETECTED NONE DETECTED   Cocaine Metabolite,Ur [place] NONE DETECTED NONE DETECTED   Opiate, Ur Screen NONE DETECTED NONE DETECTED   Phencyclidine (PCP) Ur S NONE DETECTED NONE DETECTED   Cannabinoid 50 Ng, Ur [place] NONE DETECTED NONE DETECTED   Barbiturates, Ur Screen NONE DETECTED NONE DETECTED   Benzodiazepine, Ur Scrn POSITIVE (A) NONE DETECTED   Methadone Scn, Ur NONE DETECTED NONE DETECTED    Comment: (NOTE) 100  Tricyclics, urine               Cutoff 1000 ng/mL 200  Amphetamines, urine             Cutoff 1000 ng/mL 300  MDMA (Ecstasy), urine  Cutoff 500 ng/mL 400  Cocaine Metabolite, urine       Cutoff 300 ng/mL 500  Opiate, urine                   Cutoff 300 ng/mL 600  Phencyclidine (PCP), urine      Cutoff 25 ng/mL 700  Cannabinoid, urine              Cutoff 50 ng/mL 800  Barbiturates, urine             Cutoff 200 ng/mL 900  Benzodiazepine, urine           Cutoff 200 ng/mL 1000 Methadone, urine                Cutoff 300 ng/mL 1100 1200 The urine drug screen provides only a preliminary, unconfirmed 1300 analytical test result and should not be used for non-medical 1400 purposes. Clinical consideration and professional judgment should 1500 be applied to any positive drug screen result due to possible 1600 interfering substances. A more specific alternate chemical method 1700 must be used in order to obtain a confirmed analytical result.  1800 Gas chromato graphy / mass spectrometry (GC/MS) is the preferred 1900 confirmatory method.   Urinalysis, Complete w Microscopic     Status: Abnormal   Collection Time: 03/15/17  6:15 PM  Result Value Ref Range   Color, Urine YELLOW (A) YELLOW   APPearance CLEAR (A)  CLEAR   Specific Gravity, Urine 1.012 1.005 - 1.030   pH 6.0 5.0 - 8.0   Glucose, UA NEGATIVE NEGATIVE mg/dL   Hgb urine dipstick NEGATIVE NEGATIVE   Bilirubin Urine NEGATIVE NEGATIVE   Ketones, ur NEGATIVE NEGATIVE mg/dL   Protein, ur NEGATIVE NEGATIVE mg/dL   Nitrite NEGATIVE NEGATIVE   Leukocytes, UA NEGATIVE NEGATIVE   RBC / HPF 0-5 0 - 5 RBC/hpf   WBC, UA 0-5 0 - 5 WBC/hpf   Bacteria, UA RARE (A) NONE SEEN   Squamous Epithelial / LPF 0-5 (A) NONE SEEN   Mucus PRESENT   Pregnancy, urine     Status: None   Collection Time: 03/15/17  6:15 PM  Result Value Ref Range   Preg Test, Ur NEGATIVE NEGATIVE  Hemoglobin A1c     Status: Abnormal   Collection Time: 03/16/17  7:30 AM  Result Value Ref Range   Hgb A1c MFr Bld 5.8 (H) 4.8 - 5.6 %    Comment: (NOTE) Pre diabetes:          5.7%-6.4% Diabetes:              >6.4% Glycemic control for   <7.0% adults with diabetes    Mean Plasma Glucose 119.76 mg/dL    Comment: Performed at [facility], [address] [place], [place] [address]  Lipid panel     Status: Abnormal   Collection Time: 03/16/17  7:30 AM  Result Value Ref Range   Cholesterol 174 0 - 200 mg/dL   Triglycerides 97 <604<150 mg/dL   HDL 44 >54>40 mg/dL   Total CHOL/HDL Ratio 4.0 RATIO   VLDL 19 0 - 40 mg/dL   LDL Cholesterol 098111 (H) 0 - 99 mg/dL    Comment:        Total Cholesterol/HDL:CHD Risk Coronary Heart Disease Risk Table                     Men   Women  1/2 Average Risk   3.4  3.3  Average Risk       5.0   4.4  2 X Average Risk   9.6   7.1  3 X Average Risk  23.4   11.0        Use the calculated Patient Ratio above and the CHD Risk Table to determine the patient's CHD Risk.        ATP III CLASSIFICATION (LDL):  <100     mg/dL   Optimal  161-096  mg/dL   Near or Above                    Optimal  130-159  mg/dL   Borderline  045-409  mg/dL   High  >811     mg/dL   Very High   TSH     Status: None   Collection Time: 03/16/17  7:30 AM  Result  Value Ref Range   TSH 1.024 0.350 - 4.500 uIU/mL    Comment: Performed by a 3rd Generation assay with a functional sensitivity of <=0.01 uIU/mL.    Blood Alcohol level:  Lab Results  Component Value Date   ETH <10 03/14/2017   ETH <5 10/02/2014    Metabolic Disorder Labs:  Lab Results  Component Value Date   HGBA1C 5.8 (H) 03/16/2017   MPG 119.76 03/16/2017   No results found for: PROLACTIN Lab Results  Component Value Date   CHOL 174 03/16/2017   TRIG 97 03/16/2017   HDL 44 03/16/2017   CHOLHDL 4.0 03/16/2017   VLDL 19 03/16/2017   LDLCALC 111 (H) 03/16/2017   LDLCALC 119 (H) 10/03/2014    Current Medications: Current Facility-Administered Medications  Medication Dose Route Frequency Provider Last Rate Last Dose  . acetaminophen (TYLENOL) tablet 650 mg  650 mg Oral Q6H PRN [name], [name], MD      . alum & mag hydroxide-simeth (MAALOX/MYLANTA) 200-200-20 MG/5ML suspension 30 mL  30 mL Oral Q4H PRN [name], [name], MD      . carbamazepine (TEGRETOL) tablet 200 mg  200 mg Oral BID [name], Jackquline Denmark, MD   200 mg at 03/16/17 0747  . lithium carbonate capsule 300 mg  300 mg Oral TID WC Keana Dueitt B, MD      . LORazepam (ATIVAN) tablet 2 mg  2 mg Oral Q6H PRN [name], Jackquline Denmark, MD   2 mg at 03/15/17 2335  . lurasidone (LATUDA) tablet 40 mg  40 mg Oral Q supper Shritha Bresee B, MD      . magnesium hydroxide (MILK OF MAGNESIA) suspension 30 mL  30 mL Oral Daily PRN [name], [name], MD      . ziprasidone (GEODON) injection 20 mg  20 mg Intramuscular Q12H PRN [name], [name], MD      . zolpidem (AMBIEN) tablet 5 mg  5 mg Oral QHS PRN Yecenia Dalgleish B, MD       PTA Medications: Medications Prior to Admission  Medication Sig Dispense Refill Last Dose  . medroxyPROGESTERone (DEPO-PROVERA) 150 MG/ML injection Inject 150 mg into the muscle every 3 (three) months.   Taking    Musculoskeletal: Strength & Muscle Tone: within normal limits Gait & Station:  normal Patient leans: N/A  Psychiatric Specialty Exam: Physical Exam  Nursing note and vitals reviewed. Constitutional: She is oriented to person, place, and time. She appears well-developed and well-nourished.  HENT:  Head: Normocephalic and atraumatic.  Eyes: Conjunctivae and EOM are normal. Pupils are equal, round, and reactive to  light.  Neck: Normal range of motion. Neck supple.  Cardiovascular: Normal rate, regular rhythm and normal heart sounds.  Respiratory: Effort normal and breath sounds normal.  GI: Soft. Bowel sounds are normal.  Musculoskeletal: Normal range of motion.  Neurological: She is alert and oriented to person, place, and time.  Skin: Skin is warm and dry.  Psychiatric: Her speech is normal. Her affect is labile and inappropriate. She is actively hallucinating. Thought content is paranoid and delusional. Cognition and memory are normal. She expresses impulsivity.    Review of Systems  Neurological: Negative.   Psychiatric/Behavioral: Positive for hallucinations. The patient has insomnia.   All other systems reviewed and are negative.   Blood pressure 115/81, pulse (!) 108, temperature 98.8 F (37.1 C), temperature source Oral, resp. rate 18, height 5\' 2"  (1.575 m), weight 75.8 kg (167 lb), SpO2 99 %.Body mass index is 30.54 kg/m.  See SRA                                                  Sleep:  Number of Hours: 4.15    Treatment Plan Summary: Daily contact with patient to assess and evaluate symptoms and progress in treatment and Medication management   [name] is a 46 year old female with a history of bipolar disorder admitted floridly psychotic in a mixed episode.  #Agitation, resolved -Ativan and Geodon are available  #Mood and psychosis -start Latuda 40 mg with supper -start Lithium 300 mg TID -continue Tegretol for now  #Insomnia -Ambien is available  #Metabolic syndrome monitoring -Lipid panel, TSH and HgbA1C are  pending -EKG pending -Pregnancy test is negative  #Disposition -discharge with family -needs mental health follow up   Observation Level/Precautions:  15 minute checks  Laboratory:  CBC Chemistry Profile UDS UA  Psychotherapy:    Medications:    Consultations:    Discharge Concerns:    Estimated LOS:  Other:     Physician Treatment Plan for Primary Diagnosis: Bipolar I disorder, most recent episode mixed, severe with psychotic features (HCC) Long Term Goal(s): Improvement in symptoms so as ready for discharge  Short Term Goals: Ability to identify changes in lifestyle to reduce recurrence of condition will improve, Ability to verbalize feelings will improve, Ability to disclose and discuss suicidal ideas, Ability to demonstrate self-control will improve, Ability to identify and develop effective coping behaviors will improve, Compliance with prescribed medications will improve and Ability to identify triggers associated with substance abuse/mental health issues will improve  Physician Treatment Plan for Secondary Diagnosis: Principal Problem:   Bipolar I disorder, most recent episode mixed, severe with psychotic features (HCC) Active Problems:   Bipolar 1 disorder, mixed (HCC)   Noncompliance  Long Term Goal(s): NA  Short Term Goals: NA  I certify that inpatient services furnished can reasonably be expected to improve the patient's condition.    Kristine Linea, MD 11/14/201812:12 PM

## 2017-03-16 NOTE — [facility] Group Notes (Signed)
[facility] Group Notes:  (Nursing/MHT/Case Management/Adjunct)  Date:  03/16/2017  Time:  9:20 PM  Type of Therapy:  Group Therapy  Participation Level:  Minimal  Participation Quality:  Drowsy  Affect:  Appropriate  Cognitive:  Appropriate  Insight:  Good  Engagement in Group:  Supportive  Modes of Intervention:  Support  Summary of Progress/Problems:  Mayra NeerJackie L Adessa Primiano 03/16/2017, 9:20 PM
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
## 2017-03-16 NOTE — Progress Notes (Signed)
D: Mood/Affect: Flat, depressed, calm. Patient was observed interacting appropriately among staff and others on the unit today. Alert and oriented. Denies SI, HI, AVH at this time. Denies pain. Unable to identify a goal to this Clinical research associatewriter when asked.  A: Scheduled medications administered to patient per MD order. Refused Geodon this morning. Geodon d/ced per MD order. Support and encouragement provided. Routine safety checks conducted every 15 minutes. Patient informed to notify staff with problems or concerns.  R: No adverse drug reactions noted. Patient contracts for safety at this time. Patient receptive, calm, and cooperative. Patient interacts well with others on the unit. Patient remains safe at this time.

## 2017-03-16 NOTE — [facility] Group Notes (Signed)
[facility] Group Notes:  (Nursing/MHT/Case Management/Adjunct)  Date:  03/16/2017  Time:  11:08 PM  Type of Therapy:  Psychoeducational Skills  Participation Level:  Minimal  Participation Quality:  Appropriate, Drowsy and Sharing  Affect:  Appropriate and Lethargic  Cognitive:  Appropriate and Lacking  Insight:  Appropriate and Limited  Engagement in Group:  Limited  Modes of Intervention:  Discussion and Education  Summary of Progress/Problems:  [name]  [name] 03/16/2017, 11:08 PM

## 2017-03-16 NOTE — Progress Notes (Signed)
Patient ID: Krista HabermannChelsea Cattouse Wilson, female   DOB: 14-Nov-1970, 46 y.o.   MRN: [id number] Observed in the day room with boyfriend, Riley LamDouglas, during visiting hours; receptive and pleasant on approach, when asked what brought her here, patient said, "my 46 yo is having a nervous break down because [facility] Christ loves him? He doesn't love [address] but he was told that Jesus loves him. My husband brought me here because he thinks that I am having a nevous breakdown too. While I was in the Ed, they put me in a room, I heard a voice telling me to fall because they were not paying attention to me, so I fell! They sure got my attention..."  Pt fell asleep during the Wrap up group; found, after MN, wandering around, singing, hypomanic, responding to internal stimuli, Ativan 2 mg given, redirected back to bed.

## 2017-03-16 NOTE — Plan of Care (Signed)
Patient slept for Estimated Hours of 6.45; Precautionary checks every 15 minutes for safety maintained, room free of safety hazards, patient sustains no injury or falls during this shift.  

## 2017-03-16 NOTE — [facility] Group Notes (Signed)
  [facility] LCSW Group Therapy Note  Date/Time: 03/16/17, 0930  Type of Therapy/Topic:  Group Therapy:  Emotion Regulation  Participation Level:  Minimal   Mood:shy  Description of Group:    The purpose of this group is to assist patients in learning to regulate negative emotions and experience positive emotions. Patients will be guided to discuss ways in which they have been vulnerable to their negative emotions. These vulnerabilities will be juxtaposed with experiences of positive emotions or situations, and patients challenged to use positive emotions to combat negative ones. Special emphasis will be placed on coping with negative emotions in conflict situations, and patients will process healthy conflict resolution skills.  Therapeutic Goals: 1. Patient will identify two positive emotions or experiences to reflect on in order to balance out negative emotions:  2. Patient will label two or more emotions that they find the most difficult to experience:  3. Patient will be able to demonstrate positive conflict resolution skills through discussion or role plays:   Summary of Patient Progress:Pt identified guilt as an emotion that is difficult for her.  She was mostly quiet during group but did respond when asked questions by CSW.  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       Therapeutic Modalities:   Cognitive Behavioral Therapy Feelings Identification Dialectical Behavioral Therapy  Daleen SquibbGreg Krishika Bugge, LCSW

## 2017-03-17 MED ORDER — LURASIDONE HCL 40 MG PO TABS
120.0000 mg | ORAL_TABLET | Freq: Every day | ORAL | Status: DC
  Administered 2017-03-17 – 2017-03-20 (×4): 120 mg via ORAL
  Filled 2017-03-17 (×5): qty 3

## 2017-03-17 MED ORDER — TEMAZEPAM 15 MG PO CAPS
30.0000 mg | ORAL_CAPSULE | Freq: Every day | ORAL | Status: DC
  Administered 2017-03-17 – 2017-03-20 (×4): 30 mg via ORAL
  Filled 2017-03-17 (×4): qty 2

## 2017-03-17 NOTE — Plan of Care (Signed)
Patient up ad lib with steady gait, alert and oriented to self and place. Denies SI/HI/AVH and states that her goal for today is to take her medications as prescribed and talk to the doctor for discharge. Attends group and participates actively. Affect flat but brightens when communicating with staff. Milieu remains safe.

## 2017-03-17 NOTE — Progress Notes (Signed)
Patient ID: Krista HabermannChelsea Cattouse [name], female   DOB: Oct 27, 1970, 46 y.o.   MRN: [id number] Manic, odd and bizarre behavior; singing and dancing, responding to internal stimuli, wandering about throughout the night without positive response to PRN, 2 mg of Ativan given. Soft voice, low volume of voice, needy at times, thoughts are impaired, mood and affect flat, labile, no SI/SIB observed or stated.

## 2017-03-17 NOTE — Plan of Care (Signed)
Patient slept for Estimated Hours of 3.15; Precautionary checks every 15 minutes for safety maintained, room free of safety hazards, patient sustains no injury or falls during this shift.

## 2017-03-17 NOTE — [facility] Group Notes (Signed)
[facility] Group Notes:  (Nursing/MHT/Case Management/Adjunct)  Date:  03/17/2017  Time:  7:32 PM  Type of Therapy:  Psychoeducational Skills  Participation Level:  Active  Participation Quality:  Appropriate, Attentive, Sharing and Supportive  Affect:  Appropriate  Cognitive:  Appropriate  Insight:  Appropriate and Good  Engagement in Group:  Engaged and Improving  Modes of Intervention:  Discussion and Education  Summary of Progress/Problems:  [name]  [name] 03/17/2017, 7:32 PM

## 2017-03-17 NOTE — [facility] Group Notes (Signed)
[facility] LCSW Group Therapy Note  Date/Time: 03/17/17, 0930  Type of Therapy/Topic:  Group Therapy:  Balance in Life  Participation Level:  minimal  Description of Group:    This group will address the concept of balance and how it feels and looks when one is unbalanced. Patients will be encouraged to process areas in their lives that are out of balance, and identify reasons for remaining unbalanced. Facilitators will guide patients utilizing problem- solving interventions to address and correct the stressor making their life unbalanced. Understanding and applying boundaries will be explored and addressed for obtaining  and maintaining a balanced life. Patients will be encouraged to explore ways to assertively make their unbalanced needs known to significant others in their lives, using other group members and facilitator for support and feedback.  Therapeutic Goals: 1. Patient will identify two or more emotions or situations they have that consume much of in their lives. 2. Patient will identify signs/triggers that life has become out of balance:  3. Patient will identify two ways to set boundaries in order to achieve balance in their lives:  4. Patient will demonstrate ability to communicate their needs through discussion and/or role plays  Summary of Patient Progress: Pt shared that she has little contact with her extended family and has been trying to reconnect with them on facebook.  Pt did not make other comments except to answer CSW questions during the group discussion.          Therapeutic Modalities:   Cognitive Behavioral Therapy Solution-Focused Therapy Assertiveness Training  Daleen SquibbGreg Daryan Cagley, [place]
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
## 2017-03-17 NOTE — Progress Notes (Signed)
Patient up ad lib with steady gait, alert and oriented to self and place. Denies SI/HI/AVH and states that her goal for today is to take her medications as prescribed and talk to the doctor for discharge. Attends group and participates actively. Affect flat but brightens when communicating with staff. Milieu remains safe. 

## 2017-03-17 NOTE — [facility] Suicide Risk Assessment (Signed)
[facility] INPATIENT:  Family/Significant Other Suicide Prevention Education  Suicide Prevention Education:  Education Completed; Veda CanningDouglas Lewis, boyfriend, [phone], has been identified by the patient as the family member/significant other with whom the patient will be residing, and identified as the person(s) who will aid the patient in the event of a mental health crisis (suicidal ideations/suicide attempt).  With written consent from the patient, the family member/significant other has been provided the following suicide prevention education, prior to the and/or following the discharge of the patient.  The suicide prevention education provided includes the following:  Suicide risk factors  Suicide prevention and interventions  National Suicide Hotline telephone number  [facility] assessment telephone number  [facility] Emergency Assistance 911  [facility] and/or Residential Mobile Crisis Unit telephone number  Request made of family/significant other to:  Remove weapons (e.g., guns, rifles, knives), all items previously/currently identified as safety concern.  No guns in the home, per [place].  Remove drugs/medications (over-the-counter, prescriptions, illicit drugs), all items previously/currently identified as a safety concern. No extra medications in the home, per Delrae Alfredouglass, who reports that pt brings unneeded medication back to the pharmacy.  The family member/significant other verbalizes understanding of the suicide prevention education information provided.  The family member/significant other agrees to remove the items of safety concern listed above.  Riley LamDouglas was able to recognize the signs that pt was having trouble due to this same thing happening several years ago.  Pt sleeps very little and starts to obsess about certain things. [facility] but were unable to and finally came to the hospital.  He will monitor her upon discharge and he  said they liked the services of Dr Garnetta BuddyFaheem previously and would like to see him again for follow up.  Lorri FrederickWierda, Lugenia Assefa Jon, LCSW 03/17/2017, 1:19 PM

## 2017-03-17 NOTE — [facility] Group Notes (Signed)
[facility] Group Notes:  (Nursing/MHT/Case Management/Adjunct)  Date:  03/17/2017  Time:  9:36 PM  Type of Therapy:  Group Therapy  Participation Level:  Did Not Attend  Participation Quality: Summary of Progress/Problems:  Mayra NeerJackie L June Vacha 03/17/2017, 9:36 PM

## 2017-03-17 NOTE — [facility] Group Notes (Signed)
Goals Group Date/Time: 03/17/2017 9:00 AM Type of Therapy and Topic: Group Therapy: Goals Group: SMART Goals   Participation Level: Moderate  Description of Group:    The purpose of a daily goals group is to assist and guide patients in setting recovery/wellness-related goals. The objective is to set goals as they relate to the crisis in which they were admitted. Patients will be using SMART goal modalities to set measurable goals. Characteristics of realistic goals will be discussed and patients will be assisted in setting and processing how one will reach their goal. Facilitator will also assist patients in applying interventions and coping skills learned in psycho-education groups to the SMART goal and process how one will achieve defined goal.   Therapeutic Goals:   -Patients will develop and document one goal related to or their crisis in which brought them into treatment.  -Patients will be guided by LCSW using SMART goal setting modality in how to set a measurable, attainable, realistic and time sensitive goal.  -Patients will process barriers in reaching goal.  -Patients will process interventions in how to overcome and successful in reaching goal.   Patient's Goal: Keep my happiness inside.  Pt shared that she likes to dance and sing but when she did so last night, people took it the wrong way and thought something was wrong with her.   Therapeutic Modalities:  Motivational Interviewing  Research officer, political partyCognitive Behavioral Therapy  Crisis Intervention Model  SMART goals setting   Daleen SquibbGreg Rasheem Figiel, [place]

## 2017-03-18 ENCOUNTER — Other Ambulatory Visit: Payer: Self-pay
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
## 2017-03-18 NOTE — Plan of Care (Signed)
Patient's mental status is improving, thought process is concrete. Complained of not being able to sleep here, states, "I am use to going to bed early and waking up at 5 am, I haven't been able to do that since I've been here."  Compliant with medications and attend groups with active participation noted. Denies SI/HI/AVH. Milieu remiains safe.

## 2017-03-18 NOTE — Plan of Care (Signed)
Patient slept for Estimated Hours of 3.45; Precautionary checks every 15 minutes for safety maintained, room free of safety hazards, patient sustains no injury or falls during this shift.

## 2017-03-18 NOTE — [facility] Group Notes (Signed)
[facility] LCSW Group Therapy Note  Date/Time: 03/18/17, 0930  Type of Therapy and Topic:  Group Therapy:  Feelings around Relapse and Recovery  Participation Level:  Active   Mood:pleasant  Description of Group:    Patients in this group will discuss emotions they experience before and after a relapse. They will process how experiencing these feelings, or avoidance of experiencing them, relates to having a relapse. Facilitator will guide patients to explore emotions they have related to recovery. Patients will be encouraged to process which emotions are more powerful. They will be guided to discuss the emotional reaction significant others in their lives may have to patients' relapse or recovery. Patients will be assisted in exploring ways to respond to the emotions of others without this contributing to a relapse.  Therapeutic Goals: 1. Patient will identify two or more emotions that lead to relapse for them:  2. Patient will identify two emotions that result when they relapse:  3. Patient will identify two emotions related to recovery:  4. Patient will demonstrate ability to communicate their needs through discussion and/or role plays.   Summary of Patient Progress:Pt did not share substance use history, talked about lonliness being an emotion that is difficult for her.  Pt did share that her husband had substance use issues and talked about how to help as a family member living with substance abuse.    Therapeutic Modalities:   Cognitive Behavioral Therapy Solution-Focused Therapy Assertiveness Training Relapse Prevention Therapy  Daleen SquibbGreg Aimee Timmons, LCSW

## 2017-03-18 NOTE — Progress Notes (Addendum)
Recreation Therapy Notes    Date: 11.16.18  Time: 1:00pm  Location: Craft Room  Behavioral response: Appropriate  Intervention Topic: Teambuilding  Discussion/Intervention: Group content on today was focused on team building. The group identified what team building is. Individuals described who is a part of their team. Patients expressed why they thought team building is important. The group stated reasons why they thought it was easier to work with a Comptrollersmaller/larger team. Individuals discussed some positives and negatives of working with a team. Patients gave examples of past experiences they had while working with a team. The group participated in the intervention "Save the TurinBalls", patients were in groups and had to keep the balls on the surface given.  Clinical Observations/Feedback:  Patient came to group and identified teambuilding as compromising. She described teambuilding as sharing of ideas. Individual stated that she likes working in a small group with an odd number to settle any differences that may arise. Patient participated in the intervention and was social with her peers and staff.  Lillion Elbert LRT/CTRS         Anastasija Anfinson 03/18/2017 11:26 AM

## 2017-03-18 NOTE — Progress Notes (Signed)
CH follow-up with patient. CH provided patient with a copy of Upper Room to read. Patient was having lunch. CH will come back if services are requested.

## 2017-03-18 NOTE — Progress Notes (Signed)
Patient's mental status is improving, thought process is concrete. Complained of not being able to sleep here, states, "I am use to going to bed early and waking up at 5 am, I haven't been able to do that since I've been here."  Compliant with medications and attend groups with active participation noted. Denies SI/HI/AVH. Milieu remiains safe. 

## 2017-03-18 NOTE — Progress Notes (Signed)
[facility] unit. Patient wanted to share a miracle moment in her life. CH listened and provided emotional support and encouragement. Patient requested a copy of psalms and would like a follow-up visit. CH will follow-up and provide patient the requested materials.  Patient stated, "She was having a manic moment when a stranger approached her and asked what was going on and then gave her a hug and told her that all would be fine because she is loved and God will not leave her." Patient stated that was exactly what she needed at that moment.

## 2017-03-18 NOTE — Progress Notes (Signed)
Patient ID: Krista HabermannChelsea Cattouse [name], female   DOB: 1970/08/16, 46 y.o.   MRN: [id number] Pleasantly euphoric, hypomanic, singing and dancing, elated, denied pain, cognizant, visible and pacing, no aggressive behaviors; hesitant to take Temazepam as ordered at bedtime, mouth checked, currently in bed and sleeping; will monitor closely.

## 2017-03-18 NOTE — Progress Notes (Addendum)
Isolative to her room, appears tired tonight. States she slept poorly last night. Is pleasant on contact, calm and co-operative. No manic behavior noted. Was med compliant. Remains on routine obs for safety.

## 2017-03-19 MED ORDER — WHITE PETROLATUM EX OINT
TOPICAL_OINTMENT | CUTANEOUS | Status: DC | PRN
  Filled 2017-03-19: qty 28.35

## 2017-03-19 NOTE — [facility] Group Notes (Signed)
03/19/2017 1:15pm  Type of Therapy and Topic:  Group Therapy:  Fears and Unhealthy Coping Skills  Participation Level:  Active   Description of Group:  The focus of this group was to discuss some of the prevalent fears that patients experience, and to identify the commonalities among group members.  An exercise was used to initiate the discussion, followed by writing on the white board a group-generated list of unhealthy coping and healthy coping techniques to deal with each fear.    Therapeutic Goals: 1. Patient will identify and describe 3 fears they experience 2. Patient will identify one positive coping strategy for each fear they experience 3. Patient will respond empathically to peers statements regarding fears they experience  Summary of Patient Progress:  Patient identified not being able to sleep as one of her fears. She reports that she has trouble sleeping and recently she drank too much wine and took medications at the same time, she realized that did not help. Patient reports that she is going to get back on her medications and work on herself to get better. 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 Pt actively participated in group discussion.        Therapeutic Modalities Cognitive Behavioral Therapy Motivational Interviewing  Virgilio BellingNNIA  CUEBAS-COLON, LCSWA 03/19/2017 12:23 PM
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
## 2017-03-19 NOTE — Plan of Care (Signed)
Patient pleasant and cooperative with care. Medication and group compliant. Appropriate with staff and peers. Denies SI, HI, AVH. Pt reports being admitted was a good idea because she was not sleeping at home. Reports feeling much better now. No psychosis noted.  Encouragement and support provided, medications given as prescribed. Safety checks maintained. Pt receptive and remains safe on unit with q 15 min checks.

## 2017-03-19 NOTE — Progress Notes (Addendum)
Pleasant on contact, denies psych issues, is euthymic. Was med compliant. Visible in the milieu. Boyfriend and son visited. States it went well. No physical clo, Remains on routine obs for safety.  0045 Clo itching skin. Thinks it is a medication reaction. No rash noted. States she has dry skin normally.  0450 Benadryl 50mg  for clo generalized itching.

## 2017-03-20 MED ORDER — DIPHENHYDRAMINE HCL 25 MG PO CAPS
ORAL_CAPSULE | ORAL | Status: AC
  Administered 2017-03-20: 05:00:00
  Filled 2017-03-20: qty 2

## 2017-03-20 MED ORDER — DIPHENHYDRAMINE HCL 25 MG PO CAPS
50.0000 mg | ORAL_CAPSULE | Freq: Four times a day (QID) | ORAL | Status: DC | PRN
  Administered 2017-03-20 (×2): 50 mg via ORAL
  Filled 2017-03-20 (×2): qty 2
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
## 2017-03-20 NOTE — BHH Group Notes (Signed)
03/20/2017 1:15pm  Type of Therapy and Topic:  Group Therapy:  Healthy Self Image and Positive Change  Participation Level:  Active   Description of Group:  In this group, patients will compare and contrast their current "I am...." statements to the visions they identify as desirable for their lives.  Patients discuss fears and how they can make positive changes in their cognitions that will positively impact their behaviors.  Facilitator played a motivational 3-minute speech and patients were left with the task of thinking about what "I am...." statements they can start using in their lives immediately.  Therapeutic Goals: 1. Patient will state their current self-perception as expressed in an "I Am" statement 2. Patient will contrast this with their desired vision for their live 3. Patient will identify 3 fears that negatively impact their behavior 4. Patient will discuss cognitive distortions that stem from their fears 5. Patient will verbalize statements that challenge their cognitive distortions  Summary of Patient Progress:  Pt stated, "I am a good friend". She reports that she feels friendly today and hopes that she is not over bearing. Pt identified her cognitive distortions related to her fears and the challenges to stay positive. Pt actively participated in group.      Therapeutic Modalities Cognitive Behavioral Therapy Motivational Interviewing  Johnnye SimaNNIA  CUEBAS-COLON, LCSW 03/20/2017 12:39 PM

## 2017-03-20 NOTE — Plan of Care (Signed)
Patient is calm and cooperative in the unit.Patient verbalized that she could sleep better last night and denies SI,HI and AVH.Compliant with medications.Attended groups.Appetite and energy level good.Support and encouragement given.

## 2017-03-21 LAB — LITHIUM LEVEL: LITHIUM LVL: 0.73 mmol/L (ref 0.60–1.20)

## 2017-03-21 MED ORDER — TEMAZEPAM 30 MG PO CAPS: 30.0000 mg | ORAL_CAPSULE | Freq: Every day | ORAL | 0 refills | Status: DC

## 2017-03-21 MED ORDER — LURASIDONE HCL 80 MG PO TABS: 80.0000 mg | ORAL_TABLET | Freq: Every day | ORAL | 1 refills | Status: DC

## 2017-03-21 MED ORDER — LURASIDONE HCL 80 MG PO TABS
80.0000 mg | ORAL_TABLET | Freq: Every day | ORAL | Status: DC
  Filled 2017-03-21: qty 1

## 2017-03-21 MED ORDER — LITHIUM CARBONATE 300 MG PO CAPS: 300.0000 mg | ORAL_CAPSULE | Freq: Three times a day (TID) | ORAL | 1 refills | Status: DC

## 2017-03-21 MED ORDER — TEMAZEPAM 15 MG PO CAPS: 15.0000 mg | ORAL_CAPSULE | Freq: Every day | ORAL | Status: DC

## 2017-03-21 MED ORDER — DIPHENHYDRAMINE HCL 50 MG PO CAPS: 50.0000 mg | ORAL_CAPSULE | Freq: Four times a day (QID) | ORAL | 1 refills | Status: DC | PRN

## 2017-03-21 MED ORDER — TEMAZEPAM 15 MG PO CAPS: 30.0000 mg | ORAL_CAPSULE | Freq: Every day | ORAL | Status: DC

## 2017-03-21 NOTE — Progress Notes (Signed)
D: Patient is aware of  Discharge this shift .Patient denies suicidal /homicidal ideations. Patient received all belongings brought in  A: No Storage medications. Writer reviewed Discharge Summary, Suicide Risk Assessment, and Transitional Record. Patient also received Prescriptions   from  MD.  And Letter  To take to employer   Aware  Of follow up appointment . R: Patient left unit with no questions  Or concerns  With significant other

## 2017-03-21 NOTE — Progress Notes (Signed)
Recreation Therapy Notes  INPATIENT RECREATION TR PLAN  Patient Details Name: [name] MRN: [id number] DOB: 09-Nov-1970 Today's Date: 03/21/2017  Rec Therapy Plan Is patient appropriate for Therapeutic Recreation?: Yes Treatment times per week: at least 3 Estimated Length of Stay: 5-7 days TR Treatment/Interventions: Group participation (Comment)(Appropriate particiaption in recreation therapy tx.)  Discharge Criteria Pt will be discharged from therapy if:: Discharged Treatment plan/goals/alternatives discussed and agreed upon by:: Patient/family  Discharge Summary Short term goals set: Patient will identify 3 new relaxation techniques to utilize post discharge x5 days.  Short term goals met: Complete Progress toward goals comments: Groups attended Which groups?: Coping skills, Other (Comment)(Team Building) Reason goals not met: N/A Therapeutic equipment acquired: N/A Reason patient discharged from therapy: Discharge from hospital Pt/family agrees with progress & goals achieved: Yes Date patient discharged from therapy: 03/21/17   [name] 03/21/2017, 4:32 PM

## 2017-03-21 NOTE — Progress Notes (Signed)
Recreation Therapy Notes  Date: 11.19.18  Time: 1:00 pm  Location: Craft Room  Behavioral response: Appropriate  Intervention Topic: Coping Skills  Discussion/Intervention: Group content on today was focused on coping skills. The group defined what coping skills are and when they can be used. Individuals described how they normally cope with thing and the coping skills they normally use. Patients expressed why it is important to cope with things and how not coping with things can affect you. The group participated in the intervention "My coping box" and made coping boxes while adding coping skills they could use in the future to the box. Clinical Observations/Feedback:  Patient came to group and stated that if coping skills are not used then people are not resolving their issues. She participated in the intervention and was social with peers and staff.    Aritza Brunet LRT/CTRS         [place] Fant 03/21/2017 2:32 PM

## 2017-03-21 NOTE — Progress Notes (Signed)
Up and visible in the day room. Clo being tired. Encouraged to stay up until medication time. Mood amd affect are positive, is euthymic. Was med compliant, took benadryl 50 mg po for itching at HS also. Appears to  Be asleep at this time.

## 2017-03-21 NOTE — Progress Notes (Signed)
  [facility] :  Will you be returning to the same living situation after discharge:  Yes,  with boyfriend At discharge, do you have transportation home?: Yes,  boyfriend Do you have the ability to pay for your medications: Yes,  [employer]  Release of information consent forms completed and in the chart;  Patient's signature needed at discharge.  Patient to Follow up at: Follow-up Information    [facility]. Go on 03/28/2017.   Specialty:  Behavioral Health Why:  Please attend your medication appointment with Dr. Garnetta BuddyFaheem on Monday, 03/28/17, at 2:15pm.  Please bring a copy of your hospital discharge paperwork. Contact information: [address] [facility] [place] [id number] [phone]       [facility]. Go on 05/10/2017.   Why:  Tuesday, 05/10/17 at 1:25PM with Dr. Janeece RiggersSu. Please bring your insurance card and a photo ID. Thank you.  Contact information: [address] [place] [id number] [phone]           Next level of care provider has access to [facility] Link:yes  Safety Planning and Suicide Prevention discussed: Yes,  with boyfriend  Have you used any form of tobacco in the last 30 days? (Cigarettes, Smokeless Tobacco, Cigars, and/or Pipes): No  Has patient been referred to the Quitline?: N/A patient is not a smoker  Patient has been referred for addiction treatment: Yes  Lorri FrederickWierda, Medina Degraffenreid Jon, LCSW 03/21/2017, 2:26 PM

## 2017-03-21 NOTE — BHH Group Notes (Addendum)
03/21/2017 0930AM  Type of Therapy and Topic:  Group Therapy:  Overcoming Obstacles   Participation Level:  Active   Description of Group:   In this group patients will be encouraged to explore what they see as obstacles to their own wellness and recovery. They will be guided to discuss their thoughts, feelings, and behaviors related to these obstacles. The group will process together ways to cope with barriers, with attention given to specific choices patients can make. Each patient will be challenged to identify changes they are motivated to make in order to overcome their obstacles. This group will be process-oriented, with patients participating in exploration of their own experiences, giving and receiving support, and processing challenge from other group members.   Therapeutic Goals: 1. Patient will identify personal and current obstacles as they relate to admission. 2. Patient will identify barriers that currently interfere with their wellness or overcoming obstacles.  3. Patient will identify feelings, thought process and behaviors related to these barriers. 4. Patient will identify two changes they are willing to make to overcome these obstacles:      Summary of Patient Progress Pt continues to work towards her treatment goals. She was able to appropriately participate in group discussions, and was able to offer insight and validation to other group members. Pt reported her short term obstacle is, "other people not trusting me," and that her long term obstacle was, "mental health and medications being too expensive." Pt reported something she can do to work to overcome these obstacles are, "Keep getting samples of meds to avoid the high cost of medications, and to try to be less of a perfectionist at work."    Therapeutic Modalities:   Cognitive Behavioral Therapy Solution Focused Therapy Motivational Interviewing Relapse Prevention Therapy   Heidi DachKelsey Tacora Athanas, MSW, LCSW 03/21/2017  11:46 AM

## 2017-03-21 NOTE — BHH Suicide Risk Assessment (Signed)
[facility]   Principal Problem: Bipolar I disorder, most recent episode mixed, severe with psychotic features [facility](HCC) Discharge Diagnoses:  Patient Active Problem List   Diagnosis Date Noted  . Noncompliance [Z91.19] 03/15/2017  . Head trauma [S09.90XA] 03/15/2017  . Bipolar I disorder, most recent episode mixed, severe with psychotic features (HCC) [F31.64] 03/15/2017  . Viral URI with cough [J06.9, B97.89] 07/23/2016  . Abnormal urine odor [R82.90] 07/23/2016  . Vitamin D deficiency [E55.9] 04/23/2016  . Obesity (BMI 30.0-34.9) [E66.9] 04/23/2016  . Contraception management [Z30.9] 04/23/2016  . Bipolar 1 disorder, mixed (HCC) [F31.60] 10/14/2014  . H/O hypercholesterolemia [Z86.39] 10/14/2014  . H/O: hypothyroidism [Z86.39] 10/14/2014  . Counseling for guardian-child conflict [Z62.820] 10/14/2014  . Thyroid goiter [E04.9] 10/14/2014  . FATIGUE, CHRONIC [R53.81, R53.83] 01/25/2007    Total Time spent with patient: 30 minutes  Musculoskeletal: Strength & Muscle Tone: within normal limits Gait & Station: normal Patient leans: N/A  Psychiatric Specialty Exam: Review of Systems  Neurological: Negative.   Psychiatric/Behavioral: The patient has insomnia.   All other systems reviewed and are negative.   Blood pressure (!) 124/98, pulse 94, temperature 98.7 F (37.1 C), temperature source Oral, resp. rate 18, height 5\' 2"  (1.575 m), weight 75.8 kg (167 lb), SpO2 100 %.Body mass index is 30.54 kg/m.  General Appearance: Casual  Eye Contact::  Good  Speech:  Clear and Coherent409  Volume:  Normal  Mood:  Euthymic  Affect:  Appropriate  Thought Process:  Goal Directed and Descriptions of Associations: Intact  Orientation:  Full (Time, Place, and Person)  Thought Content:  WDL  Suicidal Thoughts:  No  Homicidal Thoughts:  No  Memory:  Immediate;   Fair Recent;   Fair Remote;   Fair  Judgement:  Impaired  Insight:  Shallow  Psychomotor Activity:   Normal  Concentration:  Fair  Recall:  [employer]  Fund of Knowledge:Fair  Language: Fair  Akathisia:  No  Handed:  Right  AIMS (if indicated):     Assets:  Communication Skills Desire for Improvement Financial Resources/Insurance Housing Intimacy Physical Health Resilience Social Support Transportation Vocational/Educational  Sleep:  Number of Hours: 6  Cognition: WNL  ADL's:  Intact   Mental Status Per Nursing Assessment::   On Admission:  NA  Demographic Factors:  Caucasian  Loss Factors: NA  Historical Factors: Family history of mental illness or substance abuse and Impulsivity  Risk Reduction Factors:   Responsible for children under 10218 years of age, Sense of responsibility to family, Employed, Living with another person, especially a relative and Positive social support  Continued Clinical Symptoms:  Bipolar Disorder:   Mixed State  Cognitive Features That Contribute To Risk:  None    Suicide Risk:  Minimal: No identifiable suicidal ideation.  Patients presenting with no risk factors but with morbid ruminations; may be classified as minimal risk based on the severity of the depressive symptoms  Follow-up Information    [facility]. Go on 03/28/2017.   Specialty:  Behavioral Health Why:  Please attend your medication appointment with Dr. Garnetta BuddyFaheem on Monday, 03/28/17, at 2:15pm.  Please bring a copy of your hospital discharge paperwork. Contact information: [address] [facility] [place] [id number] [phone]          Plan Of Care/Follow-up recommendations:  Activity:  as tolerated Diet:  low sodium heart healthy Other:  keep follow up appointments  Kristine LineaJolanta Taran Haynesworth, MD 03/21/2017, 11:15 AM

## 2017-03-21 NOTE — BHH Group Notes (Signed)
BHH Group Notes:  (Nursing/MHT/Case Management/Adjunct)  Date:  03/21/2017  Time:  2:23 AM  Type of Therapy:  Psychoeducational Skills  Participation Level:  None   Wilson SingerJustin  Maryella Abood 03/21/2017, 2:23 AM
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
## 2017-03-28 ENCOUNTER — Ambulatory Visit: Payer: Self-pay | Admitting: Psychiatry

## 2017-03-31 ENCOUNTER — Ambulatory Visit (INDEPENDENT_AMBULATORY_CARE_PROVIDER_SITE_OTHER): Payer: 59 | Admitting: *Deleted

## 2017-03-31 DIAGNOSIS — Z3042 Encounter for surveillance of injectable contraceptive: Secondary | ICD-10-CM

## 2017-03-31 MED ORDER — MEDROXYPROGESTERONE ACETATE 150 MG/ML IM SUSP
150.0000 mg | Freq: Once | INTRAMUSCULAR | Status: AC
  Administered 2017-03-31: 150 mg via INTRAMUSCULAR

## 2017-04-25 ENCOUNTER — Ambulatory Visit: Payer: No Typology Code available for payment source | Admitting: Psychiatry

## 2017-05-20 ENCOUNTER — Encounter: Payer: Self-pay | Admitting: Family Medicine

## 2017-05-27 ENCOUNTER — Ambulatory Visit (INDEPENDENT_AMBULATORY_CARE_PROVIDER_SITE_OTHER): Payer: 59 | Admitting: Family Medicine

## 2017-05-27 ENCOUNTER — Encounter: Payer: Self-pay | Admitting: Family Medicine

## 2017-05-27 ENCOUNTER — Other Ambulatory Visit: Payer: Self-pay

## 2017-05-27 VITALS — BP 144/82 | HR 89 | Temp 98.7°F | Ht 60.25 in | Wt 173.2 lb

## 2017-05-27 DIAGNOSIS — Z8639 Personal history of other endocrine, nutritional and metabolic disease: Secondary | ICD-10-CM | POA: Diagnosis not present

## 2017-05-27 DIAGNOSIS — Z0000 Encounter for general adult medical examination without abnormal findings: Secondary | ICD-10-CM

## 2017-05-27 DIAGNOSIS — F3164 Bipolar disorder, current episode mixed, severe, with psychotic features: Secondary | ICD-10-CM

## 2017-05-27 NOTE — Patient Instructions (Addendum)
Check blood pressure at home off and on goal < 140/90 . Call if running > than that  More than three times. Call to schedule mammogram on your own.

## 2017-05-27 NOTE — Assessment & Plan Note (Signed)
Last check in 03/2017 nml TSH.

## 2017-05-27 NOTE — Progress Notes (Signed)
Subjective:    Patient ID: [name], female    DOB: 1970-05-07, 47 y.o.   MRN: [id number]  HPI  The patient is here for annual wellness exam and preventative care.     Hypothyroidism:  Lab Results  Component Value Date   TSH 1.024 03/16/2017    Bipolar 1 disorder : Planning change of MD to new psychiatrist, dr. Percell Belt in [place].  She is currently on lithium and no longer on latuda (made her severe drowsiness). Hospitalization for 5 day in November.  Son has now been diagnosed with bipolar disorder.  Last labs reviewed from 03/2017  Lab Results  Component Value Date   CHOL 174 03/16/2017   HDL 44 03/16/2017   LDLCALC 111 (H) 03/16/2017   LDLDIRECT 141.2 03/02/2010   TRIG 97 03/16/2017   CHOLHDL 4.0 03/16/2017   Diet: Vegetarian. Trying to decrease carbs. Has gained weight since changing.   Exercise: zoomba 3-4 days a week.   Social History /Family History/Past Medical History reviewed in detail and updated in EMR if needed. Blood pressure (!) 144/82, pulse 89, temperature 98.7 F (37.1 C), temperature source Oral, height 5' 0.25" (1.53 m), weight 173 lb 4 oz (78.6 kg). Review of Systems  Constitutional: Negative for fatigue and fever.  HENT: Negative for congestion.   Eyes: Negative for pain.  Respiratory: Negative for cough and shortness of breath.   Cardiovascular: Negative for chest pain, palpitations and leg swelling.  Gastrointestinal: Negative for abdominal pain.  Genitourinary: Negative for dysuria and vaginal bleeding.  Musculoskeletal: Negative for back pain.  Neurological: Negative for syncope, light-headedness and headaches.  Psychiatric/Behavioral: Negative for dysphoric mood.       Objective:   Physical Exam  Constitutional: Vital signs are normal. She appears well-developed and well-nourished. She is cooperative.  Non-toxic appearance. She does not appear ill. No distress.  HENT:  Head: Normocephalic.  Right Ear: Hearing,  tympanic membrane, external ear and ear canal normal.  Left Ear: Hearing, tympanic membrane, external ear and ear canal normal.  Nose: Nose normal.  Eyes: Conjunctivae, EOM and lids are normal. Pupils are equal, round, and reactive to light. Lids are everted and swept, no foreign bodies found.  Neck: Trachea normal and normal range of motion. Neck supple. Carotid bruit is not present. No thyroid mass and no thyromegaly present.  Cardiovascular: Normal rate, regular rhythm, S1 normal, S2 normal, normal heart sounds and intact distal pulses. Exam reveals no gallop.  No murmur heard. Pulmonary/Chest: Effort normal and breath sounds normal. No respiratory distress. She has no wheezes. She has no rhonchi. She has no rales.  Abdominal: Soft. Normal appearance and bowel sounds are normal. She exhibits no distension, no fluid wave, no abdominal bruit and no mass. There is no hepatosplenomegaly. There is no tenderness. There is no rebound, no guarding and no CVA tenderness. No hernia.  Lymphadenopathy:    She has no cervical adenopathy.    She has no axillary adenopathy.  Neurological: She is alert. She has normal strength. No cranial nerve deficit or sensory deficit.  Skin: Skin is warm, dry and intact. No rash noted.  Psychiatric: Her speech is normal and behavior is normal. Judgment normal. Her mood appears not anxious. Cognition and memory are normal. She does not exhibit a depressed mood.          Assessment & Plan:  The patient's preventative maintenance and recommended screening tests for an annual wellness exam were reviewed in full today. Brought up to date  unless services declined.  Counselled on the importance of diet, exercise, and its role in overall health and mortality. The patient's FH and SH was reviewed, including their home life, tobacco status, and drug and alcohol status.    Vaccines: uptodate Pap/DVE:  Last PAP from [facility] de[facility] Mammo: will schedule. No family  history of breast cancer Colon: no colon cancer in family. Smoking Status: none ETOH/ drug use: rare ETOH socially/none  HIV screen:   refused

## 2017-05-27 NOTE — Assessment & Plan Note (Signed)
Good control now.. [facility].  On lithium.  LAtuda gave her SE.

## 2017-06-20 DIAGNOSIS — Z3042 Encounter for surveillance of injectable contraceptive: Secondary | ICD-10-CM | POA: Diagnosis not present

## 2017-06-21 ENCOUNTER — Ambulatory Visit: Payer: 59

## 2017-06-28 ENCOUNTER — Other Ambulatory Visit: Payer: Self-pay

## 2017-06-28 ENCOUNTER — Encounter: Payer: Self-pay | Admitting: Psychiatry

## 2017-06-28 ENCOUNTER — Ambulatory Visit: Payer: 59 | Admitting: Psychiatry

## 2017-06-28 VITALS — BP 123/82 | HR 79 | Temp 97.7°F | Wt 175.6 lb

## 2017-06-28 DIAGNOSIS — F3160 Bipolar disorder, current episode mixed, unspecified: Secondary | ICD-10-CM

## 2017-06-28 MED ORDER — LITHIUM CARBONATE 300 MG PO CAPS: 300.0000 mg | ORAL_CAPSULE | Freq: Three times a day (TID) | ORAL | 2 refills | Status: DC

## 2017-06-28 NOTE — Patient Instructions (Signed)
PLEASE REMEMBER TO GET YOUR LITHIUM LEVEL DONE
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
## 2017-06-29 ENCOUNTER — Encounter: Payer: Self-pay | Admitting: Psychiatry

## 2017-07-05 ENCOUNTER — Other Ambulatory Visit: Payer: Self-pay | Admitting: Psychiatry

## 2017-07-06 ENCOUNTER — Telehealth: Payer: Self-pay | Admitting: Psychiatry

## 2017-07-06 LAB — LITHIUM LEVEL: Lithium Lvl: 0.5 mmol/L — ABNORMAL LOW (ref 0.6–1.2)

## 2017-07-06 NOTE — Telephone Encounter (Signed)
Called patient to discuss her lithium level.  Left message to call back

## 2017-07-07 ENCOUNTER — Telehealth: Payer: Self-pay | Admitting: Psychiatry

## 2017-07-07 NOTE — Telephone Encounter (Signed)
Patient advised to come in for an appointment since she is having side effects to the lithium.  Patient had recent lithium level done but it was subtherapeutic.  Patient reports she was not compliant since she had nausea.  Patient agrees to call the front desk to make an appointment to based on her schedule.

## 2017-07-19 ENCOUNTER — Other Ambulatory Visit: Payer: Self-pay

## 2017-07-19 ENCOUNTER — Encounter: Payer: Self-pay | Admitting: Psychiatry

## 2017-07-19 ENCOUNTER — Ambulatory Visit: Payer: 59 | Admitting: Psychiatry

## 2017-07-19 VITALS — BP 118/72 | HR 99 | Temp 99.0°F

## 2017-07-19 DIAGNOSIS — F3164 Bipolar disorder, current episode mixed, severe, with psychotic features: Secondary | ICD-10-CM
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
## 2017-07-20 ENCOUNTER — Encounter: Payer: Self-pay | Admitting: Psychiatry

## 2017-08-16 ENCOUNTER — Ambulatory Visit: Payer: No Typology Code available for payment source | Admitting: Psychiatry

## 2017-08-24 ENCOUNTER — Ambulatory Visit: Payer: No Typology Code available for payment source | Admitting: Psychiatry

## 2017-08-31 ENCOUNTER — Ambulatory Visit: Payer: 59 | Admitting: Psychiatry

## 2017-08-31 ENCOUNTER — Other Ambulatory Visit: Payer: Self-pay

## 2017-08-31 ENCOUNTER — Encounter: Payer: Self-pay | Admitting: Psychiatry

## 2017-08-31 DIAGNOSIS — F3160 Bipolar disorder, current episode mixed, unspecified: Secondary | ICD-10-CM | POA: Diagnosis not present

## 2017-08-31 MED ORDER — LITHIUM CARBONATE 300 MG PO CAPS: 300.0000 mg | ORAL_CAPSULE | Freq: Two times a day (BID) | ORAL | 2 refills | Status: DC
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
## 2017-09-17 DIAGNOSIS — Z3042 Encounter for surveillance of injectable contraceptive: Secondary | ICD-10-CM | POA: Diagnosis not present

## 2017-10-26 ENCOUNTER — Ambulatory Visit: Payer: No Typology Code available for payment source | Admitting: Psychiatry

## 2017-12-13 ENCOUNTER — Other Ambulatory Visit: Payer: Self-pay | Admitting: Psychiatry

## 2017-12-13 DIAGNOSIS — F3160 Bipolar disorder, current episode mixed, unspecified: Secondary | ICD-10-CM

## 2018-01-08 ENCOUNTER — Other Ambulatory Visit: Payer: Self-pay | Admitting: Psychiatry

## 2018-01-08 DIAGNOSIS — F3160 Bipolar disorder, current episode mixed, unspecified: Secondary | ICD-10-CM

## 2018-01-25 DIAGNOSIS — Z3042 Encounter for surveillance of injectable contraceptive: Secondary | ICD-10-CM | POA: Diagnosis not present

## 2018-02-01 ENCOUNTER — Other Ambulatory Visit: Payer: Self-pay | Admitting: Family Medicine

## 2018-02-01 NOTE — Telephone Encounter (Signed)
Copied from CRM [id number]. Topic: Quick Communication - See Telephone Encounter >> Feb 01, 2018  7:57 AM [name] wrote: CRM for notification. See Telephone encounter for: 02/01/18. Pt called and stated that she would like to [facility] [place] [facility] [address]. Pt states that they will need an RX for this. She will need the shot mid December. Please advise

## 2018-02-01 NOTE — Telephone Encounter (Signed)
Given no recent Depo in our office, past 3 months... We need to verify pt is not pregnant before giving depo just like a new start.  Please have pt come in to receive depo at office.

## 2018-02-01 NOTE — Telephone Encounter (Signed)
Left message for [facility] with below note from Dr. Ermalene Searing.  I advised that we will either need records from where she has been currently getting her Depo injections at or she will have to come to our office for urine pregnancy test and get Depo here.  I ask that she call us back with any questions.

## 2018-02-01 NOTE — Telephone Encounter (Signed)
Unable to reach pt by phone. Last got depo inj at [facility] on 03/31/17. The 06/21/17 depo inj appt cancelled by pt due to getting inj somewhere else. Last annual 05/27/17. Pt requesting depo injm to pharmacy. Unable to get pt on phone to find out where she wants to get inj.

## 2018-03-23 NOTE — Telephone Encounter (Signed)
Pt called in requesting to get the Depo shot. She wanted to confirm if we received the office notes from [facility]. Pt is wanting to proceed with depo.Please advise

## 2018-03-24 NOTE — Telephone Encounter (Addendum)
Left message for [name] that we have not received any recent records for [facility].  I advised she could request records again and have them fax them to my fax number at [phone] or she would have to be seen here for  EITHER neg preg test AND within 2 weeks of menses at appt. or neg preg test prior x  2 weeks apart prior to getting Depo Injection here.

## 2018-03-24 NOTE — Telephone Encounter (Signed)
Have you seen office notes from [facility]?

## 2018-03-24 NOTE — Telephone Encounter (Signed)
I see urgent care depo injection from 06/2017. None more recent.   Okay for pt to come in for depo but given past due... Need to have  EITHR neg preg test AND within 2 weeks of menses at appt. Or neg preg test prior x  2 weeks apart.

## 2018-03-27 NOTE — Telephone Encounter (Signed)
Received records for [facility].  Patient had her Depo Provera Shot there on 06/21/2017, 09/18/2017 and 01/25/2018. Next injection would be due.Apr 12, 2018  through 05/10/2018.  Patient is requesting Rx for Depo sent to her pharmacy to get injection there.  Records placed in Dr. Daphine DeutscherBedsole's in box for review.  Ok to send in Rx?

## 2018-03-27 NOTE — Addendum Note (Signed)
Addended by: Damita LackLORING, Daionna Crossland S on: 03/27/2018 10:44 AM   Modules accepted: Orders

## 2018-03-28 MED ORDER — MEDROXYPROGESTERONE ACETATE 150 MG/ML IM SUSP: 150.0000 mg | INTRAMUSCULAR | 0 refills | Status: DC

## 2018-03-28 NOTE — Telephone Encounter (Signed)
[name] notified by telephone that we did receive the records from [facility] and Dr. Ermalene SearingBedsole did go ahead and sent in a refill on [facility] Provera [facility] [address] [facility] [place].

## 2018-04-18 DIAGNOSIS — Z3042 Encounter for surveillance of injectable contraceptive: Secondary | ICD-10-CM | POA: Diagnosis not present

## 2018-06-19 ENCOUNTER — Other Ambulatory Visit: Payer: Self-pay | Admitting: Family Medicine

## 2018-06-19 NOTE — Telephone Encounter (Signed)
Pt is requesting the depo provera Rx  to be sent over to [facility] [address]. Pt had last shot done on [facility]/18/[facility].

## 2018-06-20 MED ORDER — MEDROXYPROGESTERONE ACETATE 150 MG/ML IM SUSP: 150.0000 mg | INTRAMUSCULAR | 0 refills | Status: DC

## 2018-06-24 DIAGNOSIS — Z3042 Encounter for surveillance of injectable contraceptive: Secondary | ICD-10-CM | POA: Diagnosis not present

## 2018-06-28 ENCOUNTER — Telehealth: Payer: Self-pay | Admitting: *Deleted

## 2018-06-28 NOTE — Telephone Encounter (Signed)
Received fax from [facility] stating patient was given Depo Provera 150 mg on 06/24/2018.  Next Depo Provera injection will be due May 10-24, 2020.

## 2018-09-16 ENCOUNTER — Other Ambulatory Visit: Payer: Self-pay | Admitting: Family Medicine

## 2018-09-18 NOTE — Telephone Encounter (Signed)
Last office visit 05/27/2017 for CPE.  Last refilled 06/20/2018 for 1 ml with no refills.  Last injection given on  06/24/2018 at [facility]. Due: May 10 through May 24.  No future appointment with PCP.  Ok to refill?

## 2018-09-19 NOTE — Telephone Encounter (Signed)
Robin, please schedule CPE ( can be in 1-2 months) with fasting labs prior.  Must schedule appointment before his medication can be refilled.  Please send back to Lupita Leash once appointment is made for her to refill. Thanks!

## 2018-09-19 NOTE — Telephone Encounter (Signed)
Patient returned called from office and scheduled for CPE and labs.  Labs- 10/16/18  Visit- 10/19/18

## 2018-09-19 NOTE — Telephone Encounter (Signed)
Left message asking pt to call office  Look @ 6/18 for appointment

## 2018-09-20 DIAGNOSIS — Z3042 Encounter for surveillance of injectable contraceptive: Secondary | ICD-10-CM | POA: Diagnosis not present

## 2018-10-13 ENCOUNTER — Telehealth: Payer: Self-pay | Admitting: Family Medicine

## 2018-10-13 DIAGNOSIS — Z8639 Personal history of other endocrine, nutritional and metabolic disease: Secondary | ICD-10-CM

## 2018-10-13 DIAGNOSIS — E669 Obesity, unspecified: Secondary | ICD-10-CM

## 2018-10-13 NOTE — Telephone Encounter (Signed)
-----   Message from Cloyd Stagers, RT sent at 10/11/2018  2:52 PM EDT ----- Regarding: Lab Orders for Monday 6.15.2020 Please place lab orders for Monday 6.15.2020, office visit for physical on Thursday 6.18.2020 Thank you, Dyke Maes RT(R)

## 2018-10-16 ENCOUNTER — Other Ambulatory Visit (INDEPENDENT_AMBULATORY_CARE_PROVIDER_SITE_OTHER): Payer: 59

## 2018-10-16 DIAGNOSIS — Z8639 Personal history of other endocrine, nutritional and metabolic disease: Secondary | ICD-10-CM

## 2018-10-16 DIAGNOSIS — E669 Obesity, unspecified: Secondary | ICD-10-CM | POA: Diagnosis not present

## 2018-10-16 LAB — T4, FREE: Free T4: 1 ng/dL (ref 0.60–1.60)

## 2018-10-16 LAB — COMPREHENSIVE METABOLIC PANEL
ALT: 19 U/L (ref 0–35)
AST: 17 U/L (ref 0–37)
Albumin: 4.4 g/dL (ref 3.5–5.2)
Alkaline Phosphatase: 65 U/L (ref 39–117)
BUN: 15 mg/dL (ref 6–23)
CO2: 22 mEq/L (ref 19–32)
Calcium: 9.5 mg/dL (ref 8.4–10.5)
Chloride: 102 mEq/L (ref 96–112)
Creatinine, Ser: 0.69 mg/dL (ref 0.40–1.20)
GFR: 91.38 mL/min (ref >60.00)
Glucose, Bld: 98 mg/dL (ref 70–99)
Potassium: 4.3 mEq/L (ref 3.5–5.1)
Sodium: 136 mEq/L (ref 135–145)
Total Bilirubin: 0.5 mg/dL (ref 0.2–1.2)
Total Protein: 7 g/dL (ref 6.0–8.3)

## 2018-10-16 LAB — LIPID PANEL
Cholesterol: 229 mg/dL — ABNORMAL HIGH (ref 0–200)
HDL: 37.7 mg/dL — ABNORMAL LOW (ref >39.00)
LDL Cholesterol: 173 mg/dL — ABNORMAL HIGH (ref 0–99)
NonHDL: 191.18
Total CHOL/HDL Ratio: 6
Triglycerides: 92 mg/dL (ref 0.0–149.0)
VLDL: 18.4 mg/dL (ref 0.0–40.0)

## 2018-10-16 LAB — T3, FREE: T3, Free: 3.6 pg/mL (ref 2.3–4.2)

## 2018-10-16 LAB — TSH: TSH: 1.97 u[IU]/mL (ref 0.35–4.50)

## 2018-10-16 LAB — HEMOGLOBIN A1C: Hgb A1c MFr Bld: 6.3 % (ref 4.6–6.5)

## 2018-10-19 ENCOUNTER — Ambulatory Visit (INDEPENDENT_AMBULATORY_CARE_PROVIDER_SITE_OTHER): Payer: 59 | Admitting: Family Medicine

## 2018-10-19 ENCOUNTER — Other Ambulatory Visit (HOSPITAL_COMMUNITY)
Admission: RE | Admit: 2018-10-19 | Discharge: 2018-10-19 | Disposition: A | Discharge status: Home / Self Care | Payer: 59 | Source: Ambulatory Visit | Attending: Family Medicine | Admitting: Family Medicine

## 2018-10-19 ENCOUNTER — Encounter: Payer: Self-pay | Admitting: Family Medicine

## 2018-10-19 ENCOUNTER — Other Ambulatory Visit: Payer: Self-pay

## 2018-10-19 VITALS — BP 120/80 | HR 108 | Temp 97.9°F | Ht 60.25 in | Wt 187.2 lb

## 2018-10-19 DIAGNOSIS — F3164 Bipolar disorder, current episode mixed, severe, with psychotic features: Secondary | ICD-10-CM

## 2018-10-19 DIAGNOSIS — Z8639 Personal history of other endocrine, nutritional and metabolic disease: Secondary | ICD-10-CM

## 2018-10-19 DIAGNOSIS — Z0000 Encounter for general adult medical examination without abnormal findings: Secondary | ICD-10-CM | POA: Diagnosis not present

## 2018-10-19 DIAGNOSIS — E669 Obesity, unspecified: Secondary | ICD-10-CM

## 2018-10-19 DIAGNOSIS — Z124 Encounter for screening for malignant neoplasm of cervix: Secondary | ICD-10-CM

## 2018-10-19 DIAGNOSIS — R7303 Prediabetes: Secondary | ICD-10-CM

## 2018-10-19 DIAGNOSIS — L608 Other nail disorders: Secondary | ICD-10-CM

## 2018-10-19 DIAGNOSIS — E7800 Pure hypercholesterolemia, unspecified: Secondary | ICD-10-CM

## 2018-10-19 NOTE — Addendum Note (Signed)
Addended by: Carter Kitten on: 10/19/2018 10:45 AM   Modules accepted: Orders

## 2018-10-19 NOTE — Patient Instructions (Addendum)
Try topical antifungal nail polish.. call if nail not improving.  You can come back in 3 motnhs for cholesterol, A1C  lab  Only check..  Set up mammogram on your own    Tinea Versicolor  Tinea versicolor is a common fungal infection of the skin. It causes a rash that appears as light or dark patches on the skin. The rash most often occurs on the chest, back, neck, or upper arms. This condition is more common during warm weather. Other than affecting how your skin looks, tinea versicolor usually does not cause other problems. In most cases, the infection goes away in a few weeks with treatment. It may take a few months for the patches on your skin to return to your usual skin color. What are the causes? This condition occurs when a type of fungus that is normally present on the skin starts to overgrow. This fungus is a kind of yeast. The exact cause of the overgrowth is not known. This condition cannot be passed from one person to another (it is not contagious). What increases the risk? This condition is more likely to develop when certain factors are present, such as:  Heat and humidity.  Sweating too much.  Hormone changes.  Oily skin.  A weak disease-fighting system (immunesystem). What are the signs or symptoms? Symptoms of this condition include:  A rash of light or dark patches on your skin. The rash may have: ? Patches of tan or pink spots (on light skin). ? Patches of white or brown spots (on dark skin). ? Patches of skin that do not tan. ? Well-marked edges. ? Scales on the discolored areas.  Mild itching. How is this diagnosed? A health care provider can usually diagnose this condition by looking at your skin. During the exam, he or she may use ultraviolet (UV) light to see how much of your skin has been affected. In some cases, a skin sample may be taken by scraping the rash. This sample will be viewed under a microscope to check for yeast overgrowth. How is this  treated? Treatment for this condition may include:  Dandruff shampoo that is applied to the affected skin during showers or bathing.  Over-the-counter medicated skin cream, lotion, or soaps.  Prescription antifungal medicine in the form of skin cream or pills.  Medicine to help reduce itching. Follow these instructions at home:  Take over-the-counter and prescription medicines only as told by your health care provider.  Apply dandruff shampoo to the affected area if your health care provider told you to do that. You may be instructed to scrub the affected skin for several minutes each day.  Do not scratch the affected area of skin.  Avoid hot and humid conditions.  Do not use tanning booths.  Try to avoid sweating a lot. Contact a health care provider if:  Your symptoms get worse.  You have a fever.  You have redness, swelling, or pain at the site of your rash.  You have fluid or blood coming from your rash.  Your rash feels warm to the touch.  You have pus or a bad smell coming from your rash.  Your rash returns (recurs) after treatment. Summary  Tinea versicolor is a common fungal infection of the skin. It causes a rash that appears as light or dark patches on the skin.  The rash most often occurs on the chest, back, neck, or upper arms.  A health care provider can usually diagnose this condition by looking  at your skin.  Treatment may include applying shampoo to the skin and taking or applying medicines. This information is not intended to replace advice given to you by your health care provider. Make sure you discuss any questions you have with your health care provider. Document Released: 04/16/2000 Document Revised: 12/21/2016 Document Reviewed: 12/21/2016 Elsevier Interactive Patient Education  2019 [employer]
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
## 2018-10-19 NOTE — Assessment & Plan Note (Signed)
MAil be nail bed bruise.. treat with topical unless not growing out.

## 2018-10-19 NOTE — Assessment & Plan Note (Signed)
Poor control.[name] get back on low chol as well as low carb diet.

## 2018-10-19 NOTE — Assessment & Plan Note (Signed)
Stable per pt off medication.

## 2018-10-19 NOTE — Assessment & Plan Note (Signed)
Stable control. 

## 2018-10-19 NOTE — Assessment & Plan Note (Signed)
IMproving control more rescnetly with low carb diet.  re-eval A1C in 3 months.

## 2018-10-24 LAB — CYTOLOGY - PAP
Diagnosis: NEGATIVE
HPV: NOT DETECTED

## 2018-12-11 ENCOUNTER — Other Ambulatory Visit: Payer: Self-pay | Admitting: Family Medicine

## 2019-03-07 ENCOUNTER — Other Ambulatory Visit: Payer: Self-pay | Admitting: Family Medicine

## 2019-03-07 NOTE — Telephone Encounter (Signed)
Last office visit 10/19/2018 for CPE.  Last [facility]2018 [facility]. Next injection due 03/03/2019 through 03/17/2019.  Ok to refill?

## 2019-05-21 ENCOUNTER — Other Ambulatory Visit: Payer: Self-pay | Admitting: Family Medicine

## 2019-05-21 ENCOUNTER — Other Ambulatory Visit: Payer: Self-pay

## 2019-05-21 MED ORDER — MEDROXYPROGESTERONE ACETATE 150 MG/ML IM SUSP: 150.0000 mg | INTRAMUSCULAR | 0 refills | Status: DC

## 2019-05-21 NOTE — Telephone Encounter (Signed)
Duplicate Request.

## 2019-05-21 NOTE — Telephone Encounter (Signed)
[facility] Night - Client Nonclinical Telephone Record AccessNurse Client [facility] Night - Client Client Site [facility] - Night Physician Kerby Nora - MD Contact Type Call Who Is Calling Patient / Member / Family / Caregiver Caller Name [facility] Caller Phone Number [phone] Patient Name [name] Patient DOB May 03, 2071 Call Type Message Only Information Provided Reason for Call Request for General Office Information Initial Comment Needs refill on her birth control Additional Comment Disp. Time Disposition Final User 05/19/2019 9:03:05 AM General Information Provided Yes Salvatore Marvel Call Closed By: Salvatore Marvel Transaction Date/Time: 05/19/2019 9:02:06 AM (ET)

## 2019-05-21 NOTE — Telephone Encounter (Signed)
Last office visit 10/19/2018 for CPE.  Last refilled 03/07/2019 for #1 with no refills.  Last Depo Injection 03/11/2019.  Next injection due: 05/27/2019 through 06/10/2019.

## 2019-05-29 ENCOUNTER — Telehealth: Payer: Self-pay

## 2019-05-29 NOTE — Telephone Encounter (Signed)
Pt has NV tomorrow for depo. Tried to contact pt to do covid screen. LVM

## 2019-05-30 ENCOUNTER — Other Ambulatory Visit: Payer: Self-pay

## 2019-05-30 ENCOUNTER — Ambulatory Visit (INDEPENDENT_AMBULATORY_CARE_PROVIDER_SITE_OTHER): Payer: BC Managed Care – PPO

## 2019-05-30 DIAGNOSIS — Z3042 Encounter for surveillance of injectable contraceptive: Secondary | ICD-10-CM

## 2019-05-30 MED ORDER — MEDROXYPROGESTERONE ACETATE 150 MG/ML IM SUSP
150.0000 mg | Freq: Once | INTRAMUSCULAR | Status: AC
  Administered 2019-05-30: 150 mg via INTRAMUSCULAR

## 2019-05-30 NOTE — Progress Notes (Signed)
Per orders of Dr. Ermalene Searing, cosigning in her absence is Deboraha Sprang, NP,  injection of depo-provera given by Sherrie George. Patient tolerated injection well.

## 2019-06-30 ENCOUNTER — Ambulatory Visit: Payer: BC Managed Care – PPO | Attending: Internal Medicine

## 2019-06-30 DIAGNOSIS — Z23 Encounter for immunization: Secondary | ICD-10-CM | POA: Insufficient documentation

## 2019-06-30 NOTE — Progress Notes (Signed)
   Covid-19 Vaccination Clinic  Name:  Marillyn Goren    MRN: [id number] DOB: [birth year]  06/30/2019  Ms. Cattouse-Wilson was observed post Covid-19 immunization for 15 minutes without incidence. She was provided with Vaccine Information Sheet and instruction to access the V-Safe system.   Ms. Butkiewicz was instructed to call 911 with any severe reactions post vaccine: [name] Difficulty breathing  . Swelling of your face and throat  . A fast heartbeat  . A bad rash all over your body  . Dizziness and weakness    Immunizations Administered    Name Date Dose VIS Date Route   Moderna COVID-19 Vaccine 06/30/2019 12:15 PM 0.5 mL 04/03/2019 Intramuscular   Manufacturer: Moderna   Lot: [id number]   NDC: 02669-167-56

## 2019-07-28 ENCOUNTER — Ambulatory Visit: Payer: BC Managed Care – PPO | Attending: Internal Medicine

## 2019-07-28 DIAGNOSIS — Z23 Encounter for immunization: Secondary | ICD-10-CM

## 2019-07-28 NOTE — Progress Notes (Signed)
   Covid-19 Vaccination Clinic  Name:  Ulanda Tackett    MRN: [id number] DOB: 1970-05-28  07/28/2019  Ms. Cattouse-Wilson was observed post Covid-19 immunization for 15 minutes without incident. She was provided with Vaccine Information Sheet and instruction to access the V-Safe system.   Ms. Lovick was instructed to call 911 with any severe reactions post vaccine: [name] Difficulty breathing  . Swelling of face and throat  . A fast heartbeat  . A bad rash all over body  . Dizziness and weakness   Immunizations Administered    Name Date Dose VIS Date Route   Moderna COVID-19 Vaccine 07/28/2019 12:13 PM 0.5 mL 04/03/2019 Intramuscular   Manufacturer: Gala Murdoch   Lot: [id number]   NDC: [id number]

## 2019-11-06 ENCOUNTER — Other Ambulatory Visit: Payer: Self-pay

## 2019-11-06 ENCOUNTER — Encounter: Payer: Self-pay | Admitting: Family Medicine

## 2019-11-06 ENCOUNTER — Ambulatory Visit: Payer: BC Managed Care – PPO | Admitting: Family Medicine

## 2019-11-06 VITALS — BP 128/82 | HR 96 | Temp 98.2°F | Wt 151.8 lb

## 2019-11-06 DIAGNOSIS — Z3009 Encounter for other general counseling and advice on contraception: Secondary | ICD-10-CM | POA: Diagnosis not present

## 2019-11-06 NOTE — Progress Notes (Signed)
Chief Complaint  Patient presents with  . Medication Management    Discuss BC options - non-hormonal?    History of Present Illness: HPI   49 year old female presents for discussion on birth control options. She is currently on depo-provera. She has been on for years... has no SE, but wants other options.    Options reviewed. Copper IUD, condoms (internal and external), and pericoital methods (ie, diaphragm, cervical cap, contraceptive sponge, and spermicide). Permanent contraception (eg, female    She has been doing Zumba and healthy eating to lose 40 lbs.. wonders if hormonal contraception could be contributing. Wt Readings from Last 3 Encounters:  11/06/19 151 lb 12.8 oz (68.9 kg)  10/19/18 187 lb 4 oz (84.9 kg)  05/27/17 173 lb 4 oz (78.6 kg)     This visit occurred during the SARS-CoV-2 public health emergency.  Safety protocols were in place, including screening questions prior to the visit, additional usage of staff PPE, and extensive cleaning of exam room while observing appropriate contact time as indicated for disinfecting solutions.   COVID 19 screen:  No recent travel or known exposure to COVID19 The patient denies respiratory symptoms of COVID 19 at this time. The importance of social distancing was discussed today.     Review of Systems  Constitutional: Negative for chills and fever.  HENT: Negative for congestion and ear pain.   Eyes: Negative for pain and redness.  Respiratory: Negative for cough and shortness of breath.   Cardiovascular: Negative for chest pain, palpitations and leg swelling.  Gastrointestinal: Negative for abdominal pain, blood in stool, constipation, diarrhea, nausea and vomiting.  Genitourinary: Negative for dysuria.  Musculoskeletal: Negative for falls and myalgias.  Skin: Negative for rash.  Neurological: Negative for dizziness.  Psychiatric/Behavioral: Negative for depression. The patient is not nervous/anxious.       Past  Medical History:  Diagnosis Date  . Hyperlipemia   . Other bipolar disorders   . Other malaise and fatigue   . Unspecified hypothyroidism     reports that she quit smoking about 40 years ago. She has never used smokeless tobacco. She reports that she does not drink alcohol and does not use drugs.   Current Outpatient Medications:  .  medroxyPROGESTERone (DEPO-PROVERA) 150 MG/ML injection, Inject 1 mL (150 mg total) into the muscle every 3 (three) months., Disp: 1 mL, Rfl: 0   Observations/Objective: Blood pressure 128/82, pulse 96, temperature 98.2 F (36.8 C), temperature source Temporal, weight 151 lb 12.8 oz (68.9 kg), last menstrual period 11/06/2019, SpO2 95 %.  Physical Exam Constitutional:      General: She is not in acute distress.    Appearance: Normal appearance. She is well-developed. She is not ill-appearing or toxic-appearing.  HENT:     Head: Normocephalic.     Right Ear: Hearing normal. Tympanic membrane is not erythematous, retracted or bulging.     Left Ear: Hearing normal. Tympanic membrane is not erythematous, retracted or bulging.     Nose: No mucosal edema or rhinorrhea.     Right Sinus: No maxillary sinus tenderness or frontal sinus tenderness.     Left Sinus: No maxillary sinus tenderness or frontal sinus tenderness.     Mouth/Throat:     Pharynx: Uvula midline.  Eyes:     General: Lids are normal. Lids are everted, no foreign bodies appreciated.     Conjunctiva/sclera: Conjunctivae normal.     Pupils: Pupils are equal, round, and reactive to light.  Neck:  Thyroid: No thyroid mass or thyromegaly.     Vascular: No carotid bruit.     Trachea: Trachea normal.  Cardiovascular:     Rate and Rhythm: Normal rate and regular rhythm.     Pulses: Normal pulses.     Heart sounds: Normal heart sounds, S1 normal and S2 normal. No murmur heard.  No friction rub. No gallop.   Pulmonary:     Effort: Pulmonary effort is normal. No tachypnea or respiratory  distress.     Breath sounds: Normal breath sounds. No decreased breath sounds, wheezing, rhonchi or rales.  Musculoskeletal:     Cervical back: Normal range of motion.  Skin:    General: Skin is warm and dry.     Findings: No rash.  Neurological:     Mental Status: She is alert.  Psychiatric:        Mood and Affect: Mood is not anxious or depressed.        Speech: Speech normal.        Behavior: Behavior normal. Behavior is cooperative.        Thought Content: Thought content normal.        Judgment: Judgment normal.      Assessment and Plan   Counseling for birth control, intrauterine device Discussed different options for non-hormonal birth control. She is interested in either copper IUD or BTL.  Refer to GYN for further treatment.  Use condoms for birth control until GYN appt.     Kerby Nora, MD

## 2019-11-06 NOTE — Assessment & Plan Note (Signed)
Discussed different options for non-hormonal birth control. She is interested in either copper IUD or BTL.  Refer to GYN for further treatment.  Use condoms for birth control until GYN appt.

## 2019-11-06 NOTE — Patient Instructions (Addendum)
We will call to set up birth control consult with Gynecology.  Use back up birth control in time being since missed last Depo shot.

## 2019-11-27 ENCOUNTER — Encounter: Payer: BC Managed Care – PPO | Admitting: Obstetrics & Gynecology

## 2019-12-17 ENCOUNTER — Encounter: Payer: Self-pay | Admitting: Emergency Medicine

## 2019-12-17 ENCOUNTER — Other Ambulatory Visit: Payer: Self-pay

## 2019-12-17 DIAGNOSIS — F3164 Bipolar disorder, current episode mixed, severe, with psychotic features: Secondary | ICD-10-CM | POA: Insufficient documentation

## 2019-12-17 DIAGNOSIS — E039 Hypothyroidism, unspecified: Secondary | ICD-10-CM | POA: Insufficient documentation

## 2019-12-17 DIAGNOSIS — Z79899 Other long term (current) drug therapy: Secondary | ICD-10-CM | POA: Insufficient documentation

## 2019-12-17 DIAGNOSIS — F312 Bipolar disorder, current episode manic severe with psychotic features: Secondary | ICD-10-CM | POA: Diagnosis not present

## 2019-12-17 DIAGNOSIS — Z20822 Contact with and (suspected) exposure to covid-19: Secondary | ICD-10-CM | POA: Insufficient documentation

## 2019-12-17 DIAGNOSIS — Z87891 Personal history of nicotine dependence: Secondary | ICD-10-CM | POA: Insufficient documentation

## 2019-12-17 NOTE — ED Triage Notes (Signed)
Pt arrived via [employer] voluntary. Pt in manic form. Pt in triage with word salad, rapid speech, unorganized thoughts and ideas. Pt denies SI and HI. Pt reports she tried to contact the police so that she could talk with the mayor. Pt singing in triage. Pt is calm and cooperative in triage.

## 2019-12-17 NOTE — ED Notes (Signed)
Pt belongings include:  1 gray shirt 1 jean 2 brown/black shoes 1 brown/black mask 1 black/white bag with personal belongings 1 green/black bra 1 green brief 1 black watch  All belongings placed into pt black/white bag, labeled with pts information.

## 2019-12-18 ENCOUNTER — Inpatient Hospital Stay
Admission: AD | Admit: 2019-12-18 | Discharge: 2020-01-04 | DRG: 885 | Disposition: A | Discharge status: Home / Self Care | Payer: BC Managed Care – PPO | Source: Intra-hospital | Attending: Psychiatry | Admitting: Psychiatry

## 2019-12-18 ENCOUNTER — Ambulatory Visit: Payer: BC Managed Care – PPO | Admitting: Family Medicine

## 2019-12-18 ENCOUNTER — Emergency Department
Admission: EM | Admit: 2019-12-18 | Discharge: 2019-12-18 | Disposition: A | Discharge status: Other Acute Inpatient Hospital | Payer: BC Managed Care – PPO | Source: Home / Self Care | Attending: Emergency Medicine | Admitting: Emergency Medicine

## 2019-12-18 DIAGNOSIS — E669 Obesity, unspecified: Secondary | ICD-10-CM | POA: Diagnosis present

## 2019-12-18 DIAGNOSIS — Z79890 Hormone replacement therapy (postmenopausal): Secondary | ICD-10-CM | POA: Diagnosis not present

## 2019-12-18 DIAGNOSIS — Z6825 Body mass index (BMI) 25.0-25.9, adult: Secondary | ICD-10-CM

## 2019-12-18 DIAGNOSIS — F411 Generalized anxiety disorder: Secondary | ICD-10-CM | POA: Diagnosis present

## 2019-12-18 DIAGNOSIS — R7303 Prediabetes: Secondary | ICD-10-CM | POA: Diagnosis present

## 2019-12-18 DIAGNOSIS — Z20822 Contact with and (suspected) exposure to covid-19: Secondary | ICD-10-CM | POA: Diagnosis present

## 2019-12-18 DIAGNOSIS — E785 Hyperlipidemia, unspecified: Secondary | ICD-10-CM | POA: Diagnosis present

## 2019-12-18 DIAGNOSIS — Z9114 Patient's other noncompliance with medication regimen: Secondary | ICD-10-CM | POA: Diagnosis not present

## 2019-12-18 DIAGNOSIS — F309 Manic episode, unspecified: Secondary | ICD-10-CM

## 2019-12-18 DIAGNOSIS — E7800 Pure hypercholesterolemia, unspecified: Secondary | ICD-10-CM | POA: Diagnosis present

## 2019-12-18 DIAGNOSIS — E039 Hypothyroidism, unspecified: Secondary | ICD-10-CM | POA: Diagnosis present

## 2019-12-18 DIAGNOSIS — Z818 Family history of other mental and behavioral disorders: Secondary | ICD-10-CM

## 2019-12-18 DIAGNOSIS — F3164 Bipolar disorder, current episode mixed, severe, with psychotic features: Secondary | ICD-10-CM | POA: Diagnosis present

## 2019-12-18 DIAGNOSIS — Z87891 Personal history of nicotine dependence: Secondary | ICD-10-CM | POA: Diagnosis not present

## 2019-12-18 DIAGNOSIS — Z79899 Other long term (current) drug therapy: Secondary | ICD-10-CM | POA: Diagnosis not present

## 2019-12-18 DIAGNOSIS — Z820 Family history of epilepsy and other diseases of the nervous system: Secondary | ICD-10-CM

## 2019-12-18 DIAGNOSIS — G4700 Insomnia, unspecified: Secondary | ICD-10-CM | POA: Diagnosis present

## 2019-12-18 DIAGNOSIS — F3160 Bipolar disorder, current episode mixed, unspecified: Secondary | ICD-10-CM | POA: Diagnosis present

## 2019-12-18 DIAGNOSIS — Z8249 Family history of ischemic heart disease and other diseases of the circulatory system: Secondary | ICD-10-CM | POA: Diagnosis not present

## 2019-12-18 DIAGNOSIS — F312 Bipolar disorder, current episode manic severe with psychotic features: Secondary | ICD-10-CM | POA: Diagnosis present

## 2019-12-18 DIAGNOSIS — F3110 Bipolar disorder, current episode manic without psychotic features, unspecified: Secondary | ICD-10-CM | POA: Diagnosis present

## 2019-12-18 LAB — CBC
HCT: 40.8 % (ref 36.0–46.0)
Hemoglobin: 13.7 g/dL (ref 12.0–15.0)
MCH: 29.7 pg (ref 26.0–34.0)
MCHC: 33.6 g/dL (ref 30.0–36.0)
MCV: 88.5 fL (ref 80.0–100.0)
Platelets: 333 10*3/uL (ref 150–400)
RBC: 4.61 MIL/uL (ref 3.87–5.11)
RDW: 11.9 % (ref 11.5–15.5)
WBC: 7.9 10*3/uL (ref 4.0–10.5)
nRBC: 0 % (ref 0.0–0.2)

## 2019-12-18 LAB — URINE DRUG SCREEN, QUALITATIVE (ARMC ONLY)
Amphetamines, Ur Screen: NOT DETECTED
Barbiturates, Ur Screen: NOT DETECTED
Benzodiazepine, Ur Scrn: NOT DETECTED
Cannabinoid 50 Ng, Ur ~~LOC~~: NOT DETECTED
Cocaine Metabolite,Ur ~~LOC~~: NOT DETECTED
MDMA (Ecstasy)Ur Screen: NOT DETECTED
Methadone Scn, Ur: NOT DETECTED
Opiate, Ur Screen: NOT DETECTED
Phencyclidine (PCP) Ur S: NOT DETECTED
Tricyclic, Ur Screen: NOT DETECTED

## 2019-12-18 LAB — COMPREHENSIVE METABOLIC PANEL
ALT: 15 U/L (ref 0–44)
AST: 21 U/L (ref 15–41)
Albumin: 4.1 g/dL (ref 3.5–5.0)
Alkaline Phosphatase: 63 U/L (ref 38–126)
Anion gap: 11 (ref 5–15)
BUN: 16 mg/dL (ref 6–20)
CO2: 27 mmol/L (ref 22–32)
Calcium: 9.1 mg/dL (ref 8.9–10.3)
Chloride: 103 mmol/L (ref 98–111)
Creatinine, Ser: 0.49 mg/dL (ref 0.44–1.00)
GFR calc Af Amer: >60 mL/min (ref >60)
GFR calc non Af Amer: >60 mL/min (ref >60)
Glucose, Bld: 121 mg/dL — ABNORMAL HIGH (ref 70–99)
Potassium: 3.2 mmol/L — ABNORMAL LOW (ref 3.5–5.1)
Sodium: 141 mmol/L (ref 135–145)
Total Bilirubin: 0.6 mg/dL (ref 0.3–1.2)
Total Protein: 7.3 g/dL (ref 6.5–8.1)

## 2019-12-18 LAB — ACETAMINOPHEN LEVEL: Acetaminophen (Tylenol), Serum: <10 ug/mL — ABNORMAL LOW (ref 10–30)

## 2019-12-18 LAB — SALICYLATE LEVEL: Salicylate Lvl: <7 mg/dL — ABNORMAL LOW (ref 7.0–30.0)

## 2019-12-18 LAB — POCT PREGNANCY, URINE: Preg Test, Ur: NEGATIVE

## 2019-12-18 LAB — SARS CORONAVIRUS 2 BY RT PCR (HOSPITAL ORDER, PERFORMED IN ~~LOC~~ HOSPITAL LAB): SARS Coronavirus 2: NEGATIVE

## 2019-12-18 LAB — ETHANOL: Alcohol, Ethyl (B): <10 mg/dL (ref <10)

## 2019-12-18 MED ORDER — ACETAMINOPHEN 325 MG PO TABS
650.0000 mg | ORAL_TABLET | Freq: Four times a day (QID) | ORAL | Status: DC | PRN
  Administered 2019-12-26 – 2019-12-27 (×3): 650 mg via ORAL
  Filled 2019-12-18 (×3): qty 2

## 2019-12-18 MED ORDER — DROPERIDOL 2.5 MG/ML IJ SOLN
2.5000 mg | Freq: Once | INTRAMUSCULAR | Status: AC
  Administered 2019-12-18: 2.5 mg via INTRAMUSCULAR
  Filled 2019-12-18: qty 2

## 2019-12-18 MED ORDER — MAGNESIUM HYDROXIDE 400 MG/5ML PO SUSP
30.0000 mL | Freq: Every day | ORAL | Status: DC | PRN
  Administered 2019-12-22 – 2020-01-04 (×6): 30 mL via ORAL
  Filled 2019-12-18 (×5): qty 30

## 2019-12-18 MED ORDER — LORAZEPAM 2 MG PO TABS
2.0000 mg | ORAL_TABLET | Freq: Once | ORAL | Status: AC
  Administered 2019-12-18: 2 mg via ORAL
  Filled 2019-12-18: qty 1

## 2019-12-18 MED ORDER — ALUM & MAG HYDROXIDE-SIMETH 200-200-20 MG/5ML PO SUSP
30.0000 mL | ORAL | Status: DC | PRN
  Administered 2019-12-23: 30 mL via ORAL
  Filled 2019-12-18: qty 30

## 2019-12-18 MED ORDER — LURASIDONE HCL 40 MG PO TABS
40.0000 mg | ORAL_TABLET | Freq: Every day | ORAL | Status: DC
  Administered 2019-12-18 – 2019-12-20 (×2): 40 mg via ORAL
  Filled 2019-12-18 (×3): qty 1

## 2019-12-18 MED ORDER — LEVOTHYROXINE SODIUM 50 MCG PO TABS: 50.0000 ug | ORAL_TABLET | Freq: Every day | ORAL | Status: DC

## 2019-12-18 MED ORDER — ATORVASTATIN CALCIUM 20 MG PO TABS
10.0000 mg | ORAL_TABLET | Freq: Every day | ORAL | Status: DC
  Administered 2019-12-19 – 2020-01-04 (×17): 10 mg via ORAL
  Filled 2019-12-18 (×17): qty 1

## 2019-12-18 MED ORDER — LITHIUM CARBONATE ER 450 MG PO TBCR: 450.0000 mg | EXTENDED_RELEASE_TABLET | Freq: Two times a day (BID) | ORAL | Status: DC

## 2019-12-18 NOTE — ED Notes (Signed)
Pt pacing in her room, talking to herself and occasionally yelling out.

## 2019-12-18 NOTE — BH Assessment (Addendum)
PATIENT BED AVAILABLE 12/18/19 during day shift or 12/18/19 during night shift  Patient is to be admitted to [facility] Attending Physician will be Dr. Toni Amend.   Patient has been assigned to room 322, by [facility] Charge Nurse [place].    ER staff is aware of the admission:  [facility] ER Secretary    Dr. Don Perking, ER MD   Selena Batten Patient's Nurse   [facility] Patient Access.

## 2019-12-18 NOTE — ED Notes (Signed)

## 2019-12-18 NOTE — ED Notes (Signed)
Pt up to bathroom.

## 2019-12-18 NOTE — ED Notes (Signed)
Patient can be heard at nurses station talking to herself.

## 2019-12-18 NOTE — ED Notes (Signed)
Pt allowed to sue the phone to call her significant other.

## 2019-12-18 NOTE — ED Notes (Signed)
Pt asking this Clinical research associate for a Set designer and a real doctor.  "I have been here for 24 hours and have not been seen by a real doctor."

## 2019-12-18 NOTE — ED Notes (Signed)
Patient continues to come out of room and this Clinical research associate and ED tech are having to redirect patient back to room. Patient states she is seeing spiders in room. This Clinical research associate and ED tech have reassured there is no spiders in room.

## 2019-12-18 NOTE — ED Notes (Signed)
Pt gave verbal permission for her mother to have her code to receive updates.

## 2019-12-18 NOTE — ED Notes (Signed)
Pt asking RN for a paper with phone numbers in her belongings.  This writer unable to locate the phone numbers.

## 2019-12-18 NOTE — ED Notes (Signed)
Pt. Alert and oriented, warm and dry, in no distress. Pt. Denies SI, HI, and AVH. Patient appears to be manic with word salad and disorganized thoughts. Patient talks about "[name]" her mother and states she has called the law on her multiple times. Pt. Encouraged to let nursing staff know of any concerns or needs.

## 2019-12-18 NOTE — ED Notes (Signed)
Pt yelling and banging on door.  EDP made aware.  IM medication given as ordered. Pt took medication wilingingly.

## 2019-12-18 NOTE — ED Notes (Signed)
Patient resting quietly in room. No noted distress or abnormal behaviors noted. Will continue 15 minute checks and observation by security camera for safety. 

## 2019-12-18 NOTE — ED Notes (Signed)
Attempted to call report.  Unable to give report at time. Will call back.

## 2019-12-18 NOTE — ED Notes (Signed)
Pt pushed her way out of her room into the bathroom.   Pt is hyper religious and believes staff is trying to kill her. Pt also  believes she is pregnant and will give birth to a super hero.  Pt returned to her room with the assistance of NT and Engineer, materials.

## 2019-12-18 NOTE — ED Provider Notes (Signed)
[facility] Emergency Department Provider Note  ____________________________________________  Time seen: Approximately 12:55 AM  I have reviewed the triage vital signs and the nursing notes.   HISTORY  Chief Complaint Mental Health Problem   HPI [name] is a 49 y.o. female with h/o bipolar ds who presents via [employer] voluntarily for mania. Patient contacted the police today wanting to speak with mayor which led to a wellness check. Patient is very manic. Reports that she is very upset because her mother is very overbearing and keeps bothering her from her home in [place]. 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 She says that her mother told her boyfriend that if he touches the patient, the patient could die, therefore patient has not been intimate with the boyfriend for months which also makes her upset. She denies any AH or VH. She does not take anything for her bipolar ds, she denies EtOH or drugs. She denies any medical problems.   Past Medical History:  Diagnosis Date  . Hyperlipemia   . Other bipolar disorders   . Other malaise and fatigue   . Unspecified hypothyroidism     Patient Active Problem List   Diagnosis Date Noted  . Counseling for birth control, intrauterine device 11/06/2019  . Hypercholesteremia 10/19/2018  . Prediabetes 10/19/2018  . Toenail deformity 10/19/2018  . Bipolar I disorder, most recent episode mixed, severe with psychotic features (HCC) 03/15/2017  . Abnormal urine odor 07/23/2016  . Obesity (BMI 30.0-34.9) 04/23/2016  . Contraception management 04/23/2016  . Bipolar 1 disorder, mixed (HCC) 10/14/2014  . H/O: hypothyroidism 10/14/2014  . Counseling for guardian-child  conflict 10/14/2014  . Thyroid goiter 10/14/2014    Past Surgical History:  Procedure Laterality Date  . radioablation of thyroid Bilateral     Prior to Admission medications   Medication Sig Start Date End Date Taking? Authorizing Provider  atorvastatin (LIPITOR) 10 MG tablet Take 10 mg by mouth daily. 08/20/19   [provider]  levothyroxine (SYNTHROID) 50 MCG tablet Take 50 mcg by mouth daily. 08/21/19   [provider]  medroxyPROGESTERone (DEPO-PROVERA) 150 MG/ML injection Inject 1 mL (150 mg total) into the muscle every 3 (three) months. 05/21/19   Excell Seltzer, MD    Allergies Patient has no known allergies.  Family History  Problem Relation Age of Onset  . Mental illness Mother   . Bipolar disorder Mother   . Heart disease Mother   . Hypertension Father   . Hypertension Sister   . Depression Brother   . Hypertension Sister   . Alzheimer's disease Maternal Grandmother   . Heart attack Maternal Grandfather     Social History Social History   Tobacco Use  . Smoking status: Former Smoker    Quit date: 10/08/1979    Years since quitting: 40.2  . Smokeless tobacco: Never Used  Substance Use Topics  . Alcohol use: No    Alcohol/week: 0.0 standard drinks  . Drug use: No    Review of Systems  Constitutional: Negative for fever. Eyes: Negative for visual changes. ENT: Negative for sore throat. Neck: No neck pain  Cardiovascular: Negative for chest pain. Respiratory: Negative for shortness of breath. Gastrointestinal: Negative  for abdominal pain, vomiting or diarrhea. Genitourinary: Negative for dysuria. Musculoskeletal: Negative for back pain. Skin: Negative for rash. Neurological: Negative for headaches, weakness or numbness. Psych: No SI or HI. + manic  ____________________________________________   PHYSICAL EXAM:  VITAL SIGNS: ED Triage Vitals [12/17/19 2343]  Enc Vitals Group     BP (!) 117/101     Pulse Rate 89     Resp 18      Temp 98.6 F (37 C)     Temp Source Oral     SpO2 98 %     Weight      Height      Head Circumference      Peak Flow      Pain Score      Pain Loc      Pain Edu?      Excl. in GC?     Constitutional: Alert and oriented. Well appearing and in no apparent distress. HEENT:      Head: Normocephalic and atraumatic.         Eyes: Conjunctivae are normal. Sclera is non-icteric.       Mouth/Throat: Mucous membranes are moist.       Neck: Supple with no signs of meningismus. Cardiovascular: Regular rate and rhythm.  Respiratory: Normal respiratory effort.  Gastrointestinal: Soft, non tender, and non distended. Musculoskeletal: No edema, cyanosis, or erythema of extremities. Neurologic: Normal speech and language. Face is symmetric. Moving all extremities. No gross focal neurologic deficits are appreciated. Skin: Skin is warm, dry and intact. No rash noted. Psychiatric: Mood and affect are elevated. Pressured speech ____________________________________________   LABS (all labs ordered are listed, but only abnormal results are displayed)  Labs Reviewed  COMPREHENSIVE METABOLIC PANEL - Abnormal; Notable for the following components:      Result Value   Potassium 3.2 (*)    Glucose, Bld 121 (*)    All other components within normal limits  SALICYLATE LEVEL - Abnormal; Notable for the following components:   Salicylate Lvl <7.0 (*)    All other components within normal limits  ACETAMINOPHEN LEVEL - Abnormal; Notable for the following components:   Acetaminophen (Tylenol), Serum <10 (*)    All other components within normal limits  SARS CORONAVIRUS 2 BY RT PCR (HOSPITAL ORDER, PERFORMED IN [facility])  ETHANOL  CBC  URINE DRUG SCREEN, QUALITATIVE ([facility] ONLY)  POCT PREGNANCY, URINE  POC URINE PREG, ED   ____________________________________________  EKG  none  ____________________________________________  RADIOLOGY  none   ____________________________________________   PROCEDURES  Procedure(s) performed: None Procedures Critical Care performed:  None ____________________________________________   INITIAL IMPRESSION / ASSESSMENT AND PLAN / ED COURSE  49 y.o. female with h/o bipolar ds who presents via [employer] voluntarily for mania. Patient is manic, with word salad, and pressured speech. No SI or HI. Meets IVC criteria. Labs for medical clearance WNL. Will consult psych.  The patient has been placed in psychiatric observation due to the need to provide a safe environment for the patient while obtaining psychiatric consultation and evaluation, as well as ongoing medical and medication management to treat the patient's condition.  The patient has been placed under full IVC at this time. Old medical records reviewed       Please note:  Patient was evaluated in Emergency Department today for the symptoms described in the history of present illness. Patient was evaluated in the context of the global COVID-19 pandemic, which necessitated consideration that the patient might be at  risk for infection with the SARS-CoV-2 virus that causes COVID-19. Institutional protocols and algorithms that pertain to the evaluation of patients at risk for COVID-19 are in a state of rapid change based on information released by regulatory bodies including the CDC and federal and state organizations. These policies and algorithms were followed during the patient's care in the ED.  Some ED evaluations and interventions may be delayed as a result of limited staffing during the pandemic.   ____________________________________________   FINAL CLINICAL IMPRESSION(S) / ED DIAGNOSES   Final diagnoses:  Mania (HCC)      NEW MEDICATIONS STARTED DURING THIS VISIT:  ED Discharge Orders    None       Note:  This document was prepared using Dragon voice recognition software and may include unintentional dictation errors.     Don Perking, [place], MD 12/18/19 [phone]

## 2019-12-18 NOTE — BH Assessment (Signed)
Assessment Note  [name] is an 49 y.o. female presenting to [facility] ED initially voluntary due to experiencing manic symptoms but has since been IVC'd by Attending ER doctor. Per triage note Pt in manic form. Pt in triage with word salad, rapid speech, unorganized thoughts and ideas. Pt denies SI and HI. Pt reports she tried to contact the police so that she could talk with the mayor. Pt singing in triage. Pt is calm and cooperative in triage. During assessment patient is alert and oriented x1, only aware of who she is, she is not aware of where she is, what day it is or the situation. Patient speech is rapid, patient having flight of ideas, irrelevant thoughts, and tangential and rapid speech, patient mood is labile and euphoric and will often laugh while speaking. When asked why patient was presenting to ED patient reported "my mother is in [place] and is going to kill Jesus, she already killed jesus, everything is burning, I'm a actress." When asked who patient lives with she reported "I live with my husband and my sister wives." Patient reported her sleep as "no sleep, Marge won't let me sleep, I don't like her, I don't want to take her calls." "Stars are stars and Cristine is the star, My mom is jealous." Patient would often refer to herself in 3rd person. Patient appears to responding to internal stimuli as she would have conversations with herself. Patient denies SI/HI, responding to [facility], unclear if she is responding to any VH.  Per Psyc NP Nira Conn patient is recommended for Inpatient Hospitalization  Diagnosis: Bipolar disorder, with psychotic features  Past Medical History:  Past Medical History:  Diagnosis Date  . Hyperlipemia   . Other bipolar disorders   . Other malaise and fatigue   . Unspecified hypothyroidism     Past Surgical History:  Procedure Laterality Date  . radioablation of thyroid Bilateral     Family History:  Family History  Problem Relation  Age of Onset  . Mental illness Mother   . Bipolar disorder Mother   . Heart disease Mother   . Hypertension Father   . Hypertension Sister   . Depression Brother   . Hypertension Sister   . Alzheimer's disease Maternal Grandmother   . Heart attack Maternal Grandfather     Social History:  reports that she quit smoking about 40 years ago. She has never used smokeless tobacco. She reports that she does not drink alcohol and does not use drugs.  Additional Social History:  Alcohol / Drug Use Pain Medications: See MAR Prescriptions: See MAR Over the Counter: See MAR History of alcohol / drug use?: No history of alcohol / drug abuse  CIWA: CIWA-Ar BP: (!) 117/101 Pulse Rate: 89 COWS:    Allergies: No Known Allergies  Home Medications: (Not in a hospital admission)   OB/GYN Status:  No LMP recorded. Patient has had an injection.  General Assessment Data Location of Assessment: [facility] ED TTS Assessment: In system Is this a Tele or Face-to-Face Assessment?: Face-to-Face Is this an Initial Assessment or a Re-assessment for this encounter?: Initial Assessment Patient Accompanied by:: N/A Language Other than English: No Living Arrangements: Other (Comment) (Private Residence) What gender do you identify as?: Female Marital status: Married Pregnancy Status: No Living Arrangements: Spouse/significant other Can pt return to current living arrangement?: Yes Admission Status: Involuntary Petitioner: ED Attending Is patient capable of signing voluntary admission?: No Referral Source: Other Insurance type: Scientist, research (physical sciences) Exam (  [facility] Walk-in ONLY) Medical Exam completed: Yes  Crisis Care Plan Living Arrangements: Spouse/significant other Legal Guardian: Other: (Self) Name of Psychiatrist: Unknown Name of Therapist: Unknown  Education Status Is patient currently in school?:  (Unknown)  Risk to self with the past 6 months Suicidal Ideation: No Has patient been a risk  to self within the past 6 months prior to admission? : No Suicidal Intent: No Has patient had any suicidal intent within the past 6 months prior to admission? : No Is patient at risk for suicide?: No Suicidal Plan?: No Has patient had any suicidal plan within the past 6 months prior to admission? : No Access to Means: No What has been your use of drugs/alcohol within the last 12 months?: Unknown Previous Attempts/Gestures:  (Unknown) Other Self Harm Risks:  (Unknown) Triggers for Past Attempts: Unknown Intentional Self Injurious Behavior:  (Unknown) Family Suicide History: Unknown Recent stressful life event(s): Other (Comment) (Unknown) Persecutory voices/beliefs?: Yes Depression: No Suicide prevention information given to non-admitted patients: Not applicable  Risk to Others within the past 6 months Homicidal Ideation: No Does patient have any lifetime risk of violence toward others beyond the six months prior to admission? : No Thoughts of Harm to Others: No Current Homicidal Intent: No Current Homicidal Plan: No Access to Homicidal Means: No Identified Victim: None History of harm to others?: No Assessment of Violence: None Noted Violent Behavior Description: None Does patient have access to weapons?:  (Unknown) Criminal Charges Pending?:  (Unknown) Does patient have a court date:  (Unknown) Is patient on probation?: Unknown  Psychosis Hallucinations: Auditory Delusions: Grandiose, Unspecified  Mental Status Report Appearance/Hygiene: In scrubs Eye Contact: Poor Motor Activity: Freedom of movement, Hyperactivity Speech: Rapid, Incoherent, Loud, Tangential, Word salad Level of Consciousness: Alert Mood: Labile, Euphoric Affect: Euphoric, Anxious, Labile Anxiety Level: Moderate Thought Processes: Tangential, Flight of Ideas, Irrelevant Judgement: Impaired Orientation: Person Obsessive Compulsive Thoughts/Behaviors: Moderate  Cognitive Functioning Concentration:  Poor Memory: Recent Impaired, Remote Impaired Is patient IDD: No Insight: Poor Impulse Control: Poor Appetite:  (Unknown) Have you had any weight changes? : No Change Sleep: Decreased Total Hours of Sleep: 0 Vegetative Symptoms: None  ADLScreening [facility] Assessment Services) Patient's cognitive ability adequate to safely complete daily activities?: Yes Patient able to express need for assistance with ADLs?: Yes Independently performs ADLs?: Yes (appropriate for developmental age)  Prior Inpatient Therapy Prior Inpatient Therapy:  (Unknown)  Prior Outpatient Therapy Prior Outpatient Therapy:  (Unknown)  ADL Screening (condition at time of admission) Patient's cognitive ability adequate to safely complete daily activities?: Yes Is the patient deaf or have difficulty hearing?: No Does the patient have difficulty seeing, even when wearing glasses/contacts?: No Does the patient have difficulty concentrating, remembering, or making decisions?: No Patient able to express need for assistance with ADLs?: Yes Does the patient have difficulty dressing or bathing?: No Independently performs ADLs?: Yes (appropriate for developmental age) Does the patient have difficulty walking or climbing stairs?: No Weakness of Legs: None Weakness of Arms/Hands: None  Home Assistive Devices/Equipment Home Assistive Devices/Equipment: None  Therapy Consults (therapy consults require a physician order) PT Evaluation Needed: No OT Evalulation Needed: No SLP Evaluation Needed: No Abuse/Neglect Assessment (Assessment to be complete while patient is alone) Abuse/Neglect Assessment Can Be Completed: Unable to assess, patient is non-responsive or altered mental status Values / Beliefs Cultural Requests During Hospitalization: None Spiritual Requests During Hospitalization: None Consults Spiritual Care Consult Needed: No Transition of Care Team Consult Needed: No  Disposition: Per Psyc NP  Nira Conn patient is recommended for Inpatient Hospitalization Disposition Initial Assessment Completed for this Encounter: Yes  On Site Evaluation by:   Reviewed with Physician:    Benay Pike MS LCASA 12/18/2019 3:04 AM

## 2019-12-19 ENCOUNTER — Encounter: Payer: Self-pay | Admitting: Psychiatry

## 2019-12-19 DIAGNOSIS — F3110 Bipolar disorder, current episode manic without psychotic features, unspecified: Secondary | ICD-10-CM

## 2019-12-19 MED ORDER — MENTHOL 3 MG MT LOZG
1.0000 | LOZENGE | OROMUCOSAL | Status: DC | PRN
  Administered 2019-12-21 – 2019-12-30 (×4): 3 mg via ORAL
  Filled 2019-12-19 (×3): qty 9

## 2019-12-19 MED ORDER — TEMAZEPAM 15 MG PO CAPS
15.0000 mg | ORAL_CAPSULE | Freq: Every evening | ORAL | Status: DC | PRN
  Administered 2019-12-19 – 2019-12-25 (×6): 15 mg via ORAL
  Filled 2019-12-19 (×7): qty 1

## 2019-12-19 NOTE — H&P (Signed)
Psychiatric Admission Assessment Adult  Patient Identification: [name] MRN:  [id number] Date of Evaluation:  12/19/2019 Chief Complaint:  Bipolar I disorder with mania (HCC) [F31.10] Principal Diagnosis: Bipolar I disorder with mania (HCC) Diagnosis:  Principal Problem:   Bipolar I disorder with mania (HCC)  History of Present Illness: Patient seen chart reviewed.  49 year old woman with a known history of recurrent manic type bipolar disorder came to the emergency room and was described as having word salad and being very disorganized when she first presented.  Patient tells me that she has recently had some stress in her life mostly related to her work.  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  Patient says that her boyfriend noticed that she was starting to get manic.  She had made an appointment to see her primary care doctor but had gone off to stay in a hotel the night before.  The story she tells then is rather confusing.  Sounds like she made a bunch of phone calls to various people who got worried enough to call the police who brought her into the hospital.  Patient has been off of psychiatric medicine for at least a couple of years.  She denies having any suicidal or homicidal ideation.  She denies any hallucinations.  It sounds like when she was at her worst she may have sent a series of inappropriate text messages to the principal at her school which may be the most troubling behavior she has right now.  Denies that she has been drinking or using any drugs.  Basically has adequate insight about the need for treatment. Associated Signs/Symptoms: Depression Symptoms:  insomnia, psychomotor agitation, difficulty concentrating, anxiety, (Hypo) Manic Symptoms:  Elevated Mood, Impulsivity, Anxiety Symptoms:   Excessive Worry, Psychotic Symptoms:  Paranoia, PTSD Symptoms: Negative Total Time spent with patient: 1 hour  Past Psychiatric History: Patient has had a history of recurrent episodes of mania.  Denies ever having had a major depression.  Her last hospitalization was a couple of years ago.  In between hospitalization she has a tendency to stop medicine.  She has been treated with a variety of medicines for bipolar disorder over the years.  She tells me that most of them of been excessively sedating for her.  She is right now only stating that she wants to take Latuda even though it is documented that she dislikes the weight gain associated with it in the past.  She does not have any reported history of suicidal behavior or violence  Is the patient at risk to self? Yes.    Has the patient been a risk to self in the past 6 months? No.  Has the patient been a risk to self within the distant past? Yes.    Is the patient a risk to others? Yes.    Has the patient been a risk to others in the past 6 months? No.  Has the patient been a risk to others within the distant past? Yes.     Prior Inpatient Therapy:   Prior Outpatient Therapy:    Alcohol Screening: 1. How often do you have a drink containing alcohol?: Never 2. How many drinks containing alcohol do you have on a typical day when you are drinking?: 1 or 2 3. How often do you have six or more drinks  on one occasion?: Never AUDIT-C Score: 0 4. How often during the last year have you found that you were not able to stop drinking once you had started?: Never 5. How often during the last year have you failed to do what was normally expected from you because of drinking?: Never 7. How often during the last year have you had a feeling of guilt of remorse after drinking?: Never 8. How often during the last year have you been unable to remember what happened the night before because you had been drinking?: Never 9. Have you or someone else been  injured as a result of your drinking?: No 10. Has a relative or friend or a doctor or another health worker been concerned about your drinking or suggested you cut down?: No Alcohol Use Disorder Identification Test Final Score (AUDIT): 0 Alcohol Brief Interventions/Follow-up: AUDIT Score <7 follow-up not indicated Substance Abuse History in the last 12 months:  No. Consequences of Substance Abuse: Negative Previous Psychotropic Medications: Yes  Psychological Evaluations: Yes  Past Medical History:  Past Medical History:  Diagnosis Date  . Hyperlipemia   . Other bipolar disorders   . Other malaise and fatigue   . Unspecified hypothyroidism     Past Surgical History:  Procedure Laterality Date  . radioablation of thyroid Bilateral    Family History:  Family History  Problem Relation Age of Onset  . Mental illness Mother   . Bipolar disorder Mother   . Heart disease Mother   . Hypertension Father   . Hypertension Sister   . Depression Brother   . Hypertension Sister   . Alzheimer's disease Maternal Grandmother   . Heart attack Maternal Grandfather    Family Psychiatric  History: Does not know of any Tobacco Screening: Have you used any form of tobacco in the last 30 days? (Cigarettes, Smokeless Tobacco, Cigars, and/or Pipes): No Social History:  Social History   Substance and Sexual Activity  Alcohol Use No  . Alcohol/week: 0.0 standard drinks     Social History   Substance and Sexual Activity  Drug Use No    Additional Social History: Marital status: Long term relationship Long term relationship, how long?: "8 years" What types of issues is patient dealing with in the relationship?: "I asked him when we were going to get married and he said we weren't" Does patient have children?: Yes How many children?: 3 How is patient's relationship with their children?: "My son(19) lives in [place], my daughters (69 and 25) come on the weekends.  My son is my go to."                          Allergies:  No Known Allergies Lab Results:  Results for orders placed or performed during the hospital encounter of 12/18/19 (from the past 48 hour(s))  Comprehensive metabolic panel     Status: Abnormal   Collection Time: 12/17/19 11:48 PM  Result Value Ref Range   Sodium 141 135 - 145 mmol/L   Potassium 3.2 (L) 3.5 - 5.1 mmol/L   Chloride 103 98 - 111 mmol/L   CO2 27 22 - 32 mmol/L   Glucose, Bld 121 (H) 70 - 99 mg/dL    Comment: Glucose reference range applies only to samples taken after fasting for at least 8 hours.   BUN 16 6 - 20 mg/dL   Creatinine, Ser 9.14 0.44 - 1.00 mg/dL   Calcium 9.1 8.9 - 78.2 mg/dL  Total Protein 7.3 6.5 - 8.1 g/dL   Albumin 4.1 3.5 - 5.0 g/dL   AST 21 15 - 41 U/L   ALT 15 0 - 44 U/L   Alkaline Phosphatase 63 38 - 126 U/L   Total Bilirubin 0.6 0.3 - 1.2 mg/dL   GFR calc non Af Amer >60 >60 mL/min   GFR calc Af Amer >60 >60 mL/min   Anion gap 11 5 - 15    Comment: Performed at [facility], [address] [place], [place] [address]  Ethanol     Status: None   Collection Time: 12/17/19 11:48 PM  Result Value Ref Range   Alcohol, Ethyl (B) <10 <10 mg/dL    Comment: (NOTE) Lowest detectable limit for serum alcohol is 10 mg/dL.  For medical purposes only. Performed at [facility], [address] [place], [place] [address]   Salicylate level     Status: Abnormal   Collection Time: 12/17/19 11:48 PM  Result Value Ref Range   Salicylate Lvl <7.0 (L) 7.0 - 30.0 mg/dL    Comment: Performed at [facility], [address] [place], [place] [address]  Acetaminophen level     Status: Abnormal   Collection Time: 12/17/19 11:48 PM  Result Value Ref Range   Acetaminophen (Tylenol), Serum <10 (L) 10 - 30 ug/mL    Comment: (NOTE) Therapeutic concentrations vary significantly. A range of 10-30 ug/mL  may be an effective concentration for many patients. However, some  are best treated at concentrations  outside of this range. Acetaminophen concentrations >150 ug/mL at 4 hours after ingestion  and >50 ug/mL at 12 hours after ingestion are often associated with  toxic reactions.  Performed at [facility], [address] [place], [place] [address]   cbc     Status: None   Collection Time: 12/17/19 11:48 PM  Result Value Ref Range   WBC 7.9 4.0 - 10.5 K/uL   RBC 4.61 3.87 - 5.11 MIL/uL   Hemoglobin 13.7 12.0 - 15.0 g/dL   HCT 29.540.8 36 - 46 %   MCV 88.5 80.0 - 100.0 fL   MCH 29.7 26.0 - 34.0 pg   MCHC 33.6 30.0 - 36.0 g/dL   RDW 62.111.9 30.811.5 - 65.715.5 %   Platelets 333 150 - 400 K/uL   nRBC 0.0 0.0 - 0.2 %    Comment: Performed at [facility], [address] [place], [place] [address]  Urine Drug Screen, Qualitative     Status: None   Collection Time: 12/17/19 11:48 PM  Result Value Ref Range   Tricyclic, Ur Screen NONE DETECTED NONE DETECTED   Amphetamines, Ur Screen NONE DETECTED NONE DETECTED   MDMA (Ecstasy)Ur Screen NONE DETECTED NONE DETECTED   Cocaine Metabolite,Ur [place] NONE DETECTED NONE DETECTED   Opiate, Ur Screen NONE DETECTED NONE DETECTED   Phencyclidine (PCP) Ur S NONE DETECTED NONE DETECTED   Cannabinoid 50 Ng, Ur [place] NONE DETECTED NONE DETECTED   Barbiturates, Ur Screen NONE DETECTED NONE DETECTED   Benzodiazepine, Ur Scrn NONE DETECTED NONE DETECTED   Methadone Scn, Ur NONE DETECTED NONE DETECTED    Comment: (NOTE) Tricyclics + metabolites, urine    Cutoff 1000 ng/mL Amphetamines + metabolites, urine  Cutoff 1000 ng/mL MDMA (Ecstasy), urine              Cutoff 500 ng/mL Cocaine Metabolite, urine          Cutoff 300 ng/mL Opiate + metabolites, urine  Cutoff 300 ng/mL Phencyclidine (PCP), urine         Cutoff 25 ng/mL Cannabinoid, urine                 Cutoff 50 ng/mL Barbiturates + metabolites, urine  Cutoff 200 ng/mL Benzodiazepine, urine              Cutoff 200 ng/mL Methadone, urine                   Cutoff 300 ng/mL  The urine drug  screen provides only a preliminary, unconfirmed analytical test result and should not be used for non-medical purposes. Clinical consideration and professional judgment should be applied to any positive drug screen result due to possible interfering substances. A more specific alternate chemical method must be used in order to obtain a confirmed analytical result. Gas chromatography / mass spectrometry (GC/MS) is the preferred confirm atory method. Performed at [facility], [address] [place], [place] [address]   SARS Coronavirus 2 by RT PCR (hospital order, performed in [facility] hospital lab) Nasopharyngeal Nasopharyngeal Swab     Status: None   Collection Time: 12/18/19  1:05 AM   Specimen: Nasopharyngeal Swab  Result Value Ref Range   SARS Coronavirus 2 NEGATIVE NEGATIVE    Comment: (NOTE) SARS-CoV-2 target nucleic acids are NOT DETECTED.  The SARS-CoV-2 RNA is generally detectable in upper and lower respiratory specimens during the acute phase of infection. The lowest concentration of SARS-CoV-2 viral copies this assay can detect is 250 copies / mL. A negative result does not preclude SARS-CoV-2 infection and should not be used as the sole basis for treatment or other patient management decisions.  A negative result may occur with improper specimen collection / handling, submission of specimen other than nasopharyngeal swab, presence of viral mutation(s) within the areas targeted by this assay, and inadequate number of viral copies (<250 copies / mL). A negative result must be combined with clinical observations, patient history, and epidemiological information.  Fact Sheet for Patients:   BoilerBrush.com.cy  Fact Sheet for Healthcare Providers: https://pope.com/  This test is not yet approved or  cleared by the [place] FDA and has been authorized for detection and/or diagnosis of SARS-CoV-2 by FDA  under an Emergency Use Authorization (EUA).  This EUA will remain in effect (meaning this test can be used) for the duration of the COVID-19 declaration under Section 564(b)(1) of the Act, 21 U.S.C. section 360bbb-3(b)(1), unless the authorization is terminated or revoked sooner.  Performed at [facility], [address] [place], [place] [address]   Pregnancy, urine POC     Status: None   Collection Time: 12/18/19  1:32 AM  Result Value Ref Range   Preg Test, Ur NEGATIVE NEGATIVE    Comment:        THE SENSITIVITY OF THIS METHODOLOGY IS >24 mIU/mL     Blood Alcohol level:  Lab Results  Component Value Date   ETH <10 12/17/2019   ETH <10 03/14/2017    Metabolic Disorder Labs:  Lab Results  Component Value Date   HGBA1C 6.3 10/16/2018   MPG 119.76 03/16/2017   No results found for: PROLACTIN Lab Results  Component Value Date   CHOL 229 (H) 10/16/2018   TRIG 92.0 10/16/2018   HDL 37.70 (L) 10/16/2018   CHOLHDL 6 10/16/2018   VLDL 18.4 10/16/2018   LDLCALC 173 (H) 10/16/2018   LDLCALC 111 (H) 03/16/2017    Current Medications: Current Facility-Administered  Medications  Medication Dose Route Frequency Provider Last Rate Last Admin  . acetaminophen (TYLENOL) tablet 650 mg  650 mg Oral Q6H PRN Uzma Hellmer T, MD      . alum & mag hydroxide-simeth (MAALOX/MYLANTA) 200-200-20 MG/5ML suspension 30 mL  30 mL Oral Q4H PRN Dalvin Clipper T, MD      . atorvastatin (LIPITOR) tablet 10 mg  10 mg Oral Daily Verlie Liotta, Jackquline Denmark, MD   10 mg at 12/19/19 0743  . lurasidone (LATUDA) tablet 40 mg  40 mg Oral Q breakfast Dashiell Franchino, Jackquline Denmark, MD   40 mg at 12/18/19 2304  . magnesium hydroxide (MILK OF MAGNESIA) suspension 30 mL  30 mL Oral Daily PRN Kattie Santoyo T, MD      . menthol-cetylpyridinium (CEPACOL) lozenge 3 mg  1 lozenge Oral PRN Muhanad Torosyan T, MD      . temazepam (RESTORIL) capsule 15 mg  15 mg Oral QHS PRN Malikah Principato, Jackquline Denmark, MD       PTA Medications: Medications Prior  to Admission  Medication Sig Dispense Refill Last Dose  . atorvastatin (LIPITOR) 10 MG tablet Take 10 mg by mouth daily.     [name] levothyroxine (SYNTHROID) 50 MCG tablet Take 50 mcg by mouth daily.     . medroxyPROGESTERone (DEPO-PROVERA) 150 MG/ML injection Inject 1 mL (150 mg total) into the muscle every 3 (three) months. 1 mL 0     Musculoskeletal: Strength & Muscle Tone: within normal limits Gait & Station: normal Patient leans: N/A  Psychiatric Specialty Exam: Physical Exam Vitals and nursing note reviewed.  Constitutional:      Appearance: She is well-developed.  HENT:     Head: Normocephalic and atraumatic.  Eyes:     Conjunctiva/sclera: Conjunctivae normal.     Pupils: Pupils are equal, round, and reactive to light.  Cardiovascular:     Heart sounds: Normal heart sounds.  Pulmonary:     Effort: Pulmonary effort is normal.  Abdominal:     Palpations: Abdomen is soft.  Musculoskeletal:        General: Normal range of motion.     Cervical back: Normal range of motion.  Skin:    General: Skin is warm and dry.  Neurological:     General: No focal deficit present.     Mental Status: She is alert.  Psychiatric:        Attention and Perception: Attention normal.        Mood and Affect: Mood is anxious. Affect is labile.        Speech: Speech is rapid and pressured.        Behavior: Behavior is agitated. Behavior is not aggressive.        Thought Content: Thought content is not paranoid. Thought content does not include homicidal or suicidal ideation.        Cognition and Memory: Cognition is impaired.        Judgment: Judgment is impulsive.     Review of Systems  Constitutional: Negative.   HENT: Negative.   Eyes: Negative.   Respiratory: Negative.   Cardiovascular: Negative.   Gastrointestinal: Negative.   Musculoskeletal: Negative.   Skin: Negative.   Neurological: Negative.   Psychiatric/Behavioral: Positive for behavioral problems. The patient is  nervous/anxious.     Blood pressure (!) 152/86, pulse 84, temperature 98 F (36.7 C), temperature source Oral, resp. rate 18, height 5\' 2"  (1.575 m), weight 62.1 kg, SpO2 100 %.Body mass index is 25.06 kg/m.  General Appearance:  Casual  Eye Contact:  Fair  Speech:  Pressured  Volume:  Increased  Mood:  Anxious and Euphoric  Affect:  Congruent  Thought Process:  Disorganized  Orientation:  Full (Time, Place, and Person)  Thought Content:  Rumination and Tangential  Suicidal Thoughts:  No  Homicidal Thoughts:  No  Memory:  Immediate;   Fair Recent;   Poor Remote;   Fair  Judgement:  Fair  Insight:  Shallow  Psychomotor Activity:  Increased  Concentration:  Concentration: Fair  Recall:  [employer] of Knowledge:  Fair  Language:  Fair  Akathisia:  No  Handed:  Right  AIMS (if indicated):     Assets:  Desire for Improvement Housing Physical Health Social Support  ADL's:  Intact  Cognition:  Impaired,  Mild  Sleep:  Number of Hours: 1.5    Treatment Plan Summary: Daily contact with patient to assess and evaluate symptoms and progress in treatment, Medication management and Plan Patient is presenting with hypomania with recent true mania at least in the emergency room.  Disruptive behavior very dangerous to her own career.  Patient has partial insight.  She is agreeable to restarting Latuda.  I pointed out that in the past she had reportedly not like that medicine but now she says it is what she wants to take.  We have already got her started on 40 mg.  She refuses to take lithium at this time.  Continue 15-minute checks.  I will engage her in individual and group therapy and treatment team will meet tomorrow.  She will need appropriate outpatient psychiatric management at discharge  Observation Level/Precautions:  15 minute checks  Laboratory:  Chemistry Profile  Psychotherapy:    Medications:    Consultations:    Discharge Concerns:    Estimated LOS:  Other:     Physician  Treatment Plan for Primary Diagnosis: Bipolar I disorder with mania (HCC) Long Term Goal(s): Improvement in symptoms so as ready for discharge  Short Term Goals: Ability to demonstrate self-control will improve and Ability to identify and develop effective coping behaviors will improve  Physician Treatment Plan for Secondary Diagnosis: Principal Problem:   Bipolar I disorder with mania (HCC)  Long Term Goal(s): Improvement in symptoms so as ready for discharge  Short Term Goals: Compliance with prescribed medications will improve  I certify that inpatient services furnished can reasonably be expected to improve the patient's condition.    Mordecai Rasmussen, MD 8/18/20215:17 PM

## 2019-12-19 NOTE — Plan of Care (Signed)
Patient is newly admitted to the unit.   Problem: Education: Goal: Knowledge of [facility] Education information/materials will improve Outcome: Not Progressing Goal: Emotional status will improve Outcome: Not Progressing Goal: Mental status will improve Outcome: Not Progressing Goal: Verbalization of understanding the information provided will improve Outcome: Not Progressing   Problem: Activity: Goal: Interest or engagement in activities will improve Outcome: Not Progressing Goal: Sleeping patterns will improve Outcome: Not Progressing   Problem: Coping: Goal: Ability to verbalize frustrations and anger appropriately will improve Outcome: Not Progressing Goal: Ability to demonstrate self-control will improve Outcome: Not Progressing   Problem: Health Behavior/Discharge Planning: Goal: Identification of resources available to assist in meeting health care needs will improve Outcome: Not Progressing Goal: Compliance with treatment plan for underlying cause of condition will improve Outcome: Not Progressing   Problem: Physical Regulation: Goal: Ability to maintain clinical measurements within normal limits will improve Outcome: Not Progressing   Problem: Safety: Goal: Periods of time without injury will increase Outcome: Not Progressing   Problem: Activity: Goal: Will verbalize the importance of balancing activity with adequate rest periods Outcome: Not Progressing   Problem: Education: Goal: Will be free of psychotic symptoms Outcome: Not Progressing Goal: Knowledge of the prescribed therapeutic regimen will improve Outcome: Not Progressing   Problem: Coping: Goal: Coping ability will improve Outcome: Not Progressing Goal: Will verbalize feelings Outcome: Not Progressing   Problem: Health Behavior/Discharge Planning: Goal: Compliance with prescribed medication regimen will improve Outcome: Not Progressing   Problem: Nutritional: Goal: Ability to  achieve adequate nutritional intake will improve Outcome: Not Progressing     Problem: Role Relationship: Goal: Ability to communicate needs accurately will improve Outcome: Not Progressing Goal: Ability to interact with others will improve Outcome: Not Progressing   Problem: Safety: Goal: Ability to redirect hostility and anger into socially appropriate behaviors will improve Outcome: Not Progressing Goal: Ability to remain free from injury will improve Outcome: Not Progressing   Problem: Self-Care: Goal: Ability to participate in self-care as condition permits will improve Outcome: Not Progressing   Problem: Self-Concept: Goal: Will verbalize positive feelings about self Outcome: Not Progressing

## 2019-12-19 NOTE — Plan of Care (Signed)
Pt rates depression 5/10. Pt denies anxiety, SI, HI and AVH. Pt was educated on care plan and verbalizes understanding. Torrie Mayers RN Problem: Education: Goal: Knowledge of [facility] Education information/materials will improve Outcome: Progressing Goal: Emotional status will improve Outcome: Progressing Goal: Mental status will improve Outcome: Progressing Goal: Verbalization of understanding the information provided will improve Outcome: Progressing   Problem: Activity: Goal: Interest or engagement in activities will improve Outcome: Progressing Goal: Sleeping patterns will improve Outcome: Progressing   Problem: Coping: Goal: Ability to verbalize frustrations and anger appropriately will improve Outcome: Progressing Goal: Ability to demonstrate self-control will improve Outcome: Progressing   Problem: Health Behavior/Discharge Planning: Goal: Identification of resources available to assist in meeting health care needs will improve Outcome: Progressing Goal: Compliance with treatment plan for underlying cause of condition will improve Outcome: Progressing   Problem: Physical Regulation: Goal: Ability to maintain clinical measurements within normal limits will improve Outcome: Progressing   Problem: Safety: Goal: Periods of time without injury will increase Outcome: Progressing   Problem: Activity: Goal: Will verbalize the importance of balancing activity with adequate rest periods Outcome: Progressing   Problem: Education: Goal: Will be free of psychotic symptoms Outcome: Progressing Goal: Knowledge of the prescribed therapeutic regimen will improve Outcome: Progressing   Problem: Coping: Goal: Coping ability will improve Outcome: Progressing Goal: Will verbalize feelings Outcome: Progressing   Problem: Health Behavior/Discharge Planning: Goal: Compliance with prescribed medication regimen will improve Outcome: Progressing   Problem:  Nutritional: Goal: Ability to achieve adequate nutritional intake will improve Outcome: Progressing   Problem: Role Relationship: Goal: Ability to communicate needs accurately will improve Outcome: Progressing Goal: Ability to interact with others will improve Outcome: Progressing   Problem: Safety: Goal: Ability to redirect hostility and anger into socially appropriate behaviors will improve Outcome: Progressing Goal: Ability to remain free from injury will improve Outcome: Progressing   Problem: Self-Care: Goal: Ability to participate in self-care as condition permits will improve Outcome: Progressing   Problem: Self-Concept: Goal: Will verbalize positive feelings about self Outcome: Progressing
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
## 2019-12-19 NOTE — Progress Notes (Signed)
D- Patient alert and oriented. Affect/mood is pleasant. Pt denies SI, HI, AVH, and pain. Pt has been social and went to groups.   A- Scheduled medications administered to patient, per MD orders. Support and encouragement provided.  Routine safety checks conducted every 15 minutes.  Patient informed to notify staff with problems or concerns.  R- No adverse drug reactions noted. Patient contracts for safety at this time. Patient compliant with medications and treatment plan. Patient receptive, calm, and cooperative. Patient interacts well with others on the unit.  Patient remains safe at this time.  Krista Mayers RN

## 2019-12-19 NOTE — Tx Team (Signed)
Initial Treatment Plan 12/19/2019 3:36 AM [name] LTJ:[id number]    PATIENT STRESSORS: Marital or family conflict Medication change or noncompliance   PATIENT STRENGTHS: General fund of knowledge Motivation for treatment/growth Supportive family/friends   PATIENT IDENTIFIED PROBLEMS: Bipolar 1 disorder                      DISCHARGE CRITERIA:  Adequate post-discharge living arrangements Improved stabilization in mood, thinking, and/or behavior Motivation to continue treatment in a less acute level of care  PRELIMINARY DISCHARGE PLAN: Outpatient therapy  PATIENT/FAMILY INVOLVEMENT: This treatment plan has been presented to and reviewed with the patient, [name], The patient and family have been given the opportunity to ask questions and make suggestions.  Trula Ore, RN 12/19/2019, 3:36 AM

## 2019-12-19 NOTE — [facility] Suicide Risk Assessment (Signed)
[facility] Admission Suicide Risk Assessment   Nursing information obtained from:  Patient Demographic factors:  Divorced or widowed Current Mental Status:  NA Loss Factors:  NA Historical Factors:  Impulsivity Risk Reduction Factors:  Responsible for children under 47 years of age, Sense of responsibility to family, Employed, Living with another person, especially a relative, Positive therapeutic relationship  Total Time spent with patient: 1 hour Principal Problem: Bipolar I disorder with mania (HCC) Diagnosis:  Principal Problem:   Bipolar I disorder with mania (HCC)  Subjective Data: Patient seen chart reviewed.  49 year old woman with a history of bipolar disorder presented with several days of manic symptoms.  Hypomanic and somewhat agitated currently.  Good insight agreeable to treatment.  Denies any suicidal thoughts.  Continued Clinical Symptoms:  Alcohol Use Disorder Identification Test Final Score (AUDIT): 0 The "Alcohol Use Disorders Identification Test", Guidelines for Use in Primary Care, Second Edition.  World Science writer [facility]). Score between 0-7:  no or low risk or alcohol related problems. Score between 8-15:  moderate risk of alcohol related problems. Score between 16-19:  high risk of alcohol related problems. Score 20 or above:  warrants further diagnostic evaluation for alcohol dependence and treatment.   CLINICAL FACTORS:   Bipolar Disorder:   Mixed State   Musculoskeletal: Strength & Muscle Tone: within normal limits Gait & Station: normal Patient leans: N/A  Psychiatric Specialty Exam: Physical Exam Vitals and nursing note reviewed.  Constitutional:      Appearance: She is well-developed.  HENT:     Head: Normocephalic and atraumatic.  Eyes:     Conjunctiva/sclera: Conjunctivae normal.     Pupils: Pupils are equal, round, and reactive to light.  Cardiovascular:     Heart sounds: Normal heart sounds.  Pulmonary:     Effort: Pulmonary effort is  normal.  Abdominal:     Palpations: Abdomen is soft.  Musculoskeletal:        General: Normal range of motion.     Cervical back: Normal range of motion.  Skin:    General: Skin is warm and dry.  Neurological:     General: No focal deficit present.     Mental Status: She is alert.  Psychiatric:        Attention and Perception: Attention normal.        Mood and Affect: Mood is anxious and elated. Affect is labile.        Speech: Speech is rapid and pressured.        Behavior: Behavior is agitated. Behavior is not aggressive.        Thought Content: Thought content is not paranoid. Thought content does not include homicidal or suicidal ideation.        Cognition and Memory: Cognition is impaired.        Judgment: Judgment is impulsive.     Review of Systems  Constitutional: Negative.   HENT: Negative.   Eyes: Negative.   Respiratory: Negative.   Cardiovascular: Negative.   Gastrointestinal: Negative.   Musculoskeletal: Negative.   Skin: Negative.   Neurological: Negative.   Psychiatric/Behavioral: The patient is nervous/anxious and is hyperactive.     Blood pressure (!) 152/86, pulse 84, temperature 98 F (36.7 C), temperature source Oral, resp. rate 18, height 5\' 2"  (1.575 m), weight 62.1 kg, SpO2 100 %.Body mass index is 25.06 kg/m.  General Appearance: Casual  Eye Contact:  Fair  Speech:  Clear and Coherent  Volume:  Increased  Mood:  Anxious  Affect:  Labile  Thought Process:  Disorganized  Orientation:  Full (Time, Place, and Person)  Thought Content:  Logical, Rumination and Tangential  Suicidal Thoughts:  No  Homicidal Thoughts:  No  Memory:  Immediate;   Fair Recent;   Poor Remote;   Fair  Judgement:  Fair  Insight:  Fair  Psychomotor Activity:  Increased  Concentration:  Concentration: Fair  Recall:  [employer] of Knowledge:  Fair  Language:  Fair  Akathisia:  No  Handed:  Right  AIMS (if indicated):     Assets:  Desire for  Improvement Housing Physical Health Social Support  ADL's:  Impaired  Cognition:  WNL  Sleep:  Number of Hours: 1.5      COGNITIVE FEATURES THAT CONTRIBUTE TO RISK:  Thought constriction (tunnel vision)    SUICIDE RISK:   Minimal: No identifiable suicidal ideation.  Patients presenting with no risk factors but with morbid ruminations; may be classified as minimal risk based on the severity of the depressive symptoms  PLAN OF CARE: Continue 15-minute checks.  Engage in individual and group therapy.  Initiate appropriate medicine for mania.  Work on appropriate discharge planning  I certify that inpatient services furnished can reasonably be expected to improve the patient's condition.   Mordecai Rasmussen, MD 12/19/2019, 5:09 PM

## 2019-12-19 NOTE — Progress Notes (Signed)
Recreation Therapy Notes   Date: 12/19/2019  Time: 9:30 am  Location: Craft room   Behavioral response: Appropriate  Intervention Topic: Goals    Discussion/Intervention:  Group content on today was focused on goals. Patients described what goals are and how they define goals. Individuals expressed how they go about setting goals and reaching them. The group identified how important goals are and if they make short term goals to reach long term goals. Patients described how many goals they work on at a time and what affects them not reaching their goal. Individuals described how much time they put into planning and obtaining their goals. The group participated in the intervention "My Goal Board" and made personal goal boards to help them achieve their goal. Clinical Observations/Feedback:  Patient came to group and defined goals as a dream with a plan. She identified a goal should would like to work on is to regulate her illness and recognize when she needs help and when to use her tool box. Participant explained that goals ar important to improve relations. Individual was social with peers and staff while participating in the intervention. [name] LRT/CTRS         Gokul Waybright 12/19/2019 11:37 AM

## 2019-12-19 NOTE — Plan of Care (Signed)
Patient calm and appropriate on the unit. Stated to this Clinical research associate that she was feeling better today   Problem: Education: Goal: Emotional status will improve Outcome: Progressing Goal: Mental status will improve Outcome: Progressing

## 2019-12-19 NOTE — B[facility] Counselor (Signed)
CSW contacted [facility] to schedule the patient for an appointment.  CSW was informed that the patient could not return to [facility] due to no shows and being noncompliant with medication.    Penni Homans, MSW, LCSW 12/19/2019 3:02 PM
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
## 2019-12-19 NOTE — Progress Notes (Signed)
Patient presents with in a manic state, continuing to come up to the nurses station ruminating about her boyfriend bringing up 3 weeks worth of clothing. Patient given education, support, and encouragement to be active in her treatment plan. Patient compliant with medication administration per MD orders. Patient observed interacting appropriately with staff and peers on the unit. Patient did have a crying spell but was redirectable. Patient being monitored Q 15 minutes for safety per unit protocol. Patient remains safe on the unit.

## 2019-12-19 NOTE — Progress Notes (Signed)
Admission Note:  49 yr Female who presents IVC in no acute distress for the treatment of Bipolar 1 disorder. Patient appears blunted and Manic, her speech is pressured , thoughts are disorganized she appears euphoric and exited  Upon arrival on the unit, she was  cooperative with admission process., she currently denies SI/HI/AVH and contracts for safety upon admission. Patient per report was brought to the hospital by police because she called the police department asking to speak to the [facility] which led to a wellness check and subsequently lead to her admission for bizarre behavior.    Patient has Past medical Hx of  Prediabetes ,Bipolar disorder and hypercholesteremia, skin was assessed and found to be clear, warm and dry, also she was searched and no contraband found, POC and unit policies explained and understanding verbalized.,and consents obtained. Food and fluids was offered, 15 minutes safety checks maintained will continue to monitor.

## 2019-12-19 NOTE — Progress Notes (Signed)
Recreation Therapy Notes  INPATIENT RECREATION THERAPY ASSESSMENT  Patient Details Name: [name] MRN: [id number] DOB: Feb 25, 1971 Today's Date: 12/19/2019       Information Obtained From: Patient  Able to Participate in Assessment/Interview: Yes  Patient Presentation: Responsive  Reason for Admission (Per Patient): Active Symptoms, Med Non-Compliance  Patient Stressors: Work  Pharmacologist:   Music, Exercise, Talk  Leisure Interests (2+):  Social - Friends, Exercise - Walking (Yoga, Zumba)  Frequency of Recreation/Participation: Monthly  Awareness of Community Resources:  Yes  Community Resources:  [employer], Engineering geologist  Current Use:    If no, Barriers?:    Expressed Interest in [employer] Information:    [employer] of Residence:  Film/video editor  Patient Main Form of Transportation: Set designer  Patient Strengths:  Good listener  Patient Identified Areas of Improvement:  Not talk so much  Patient Goal for Hospitalization:  Get the right medication to control symptoms of mania  Current SI (including self-harm):  No  Current HI:  No  Current AVH: No  Staff Intervention Plan: Group Attendance, Collaborate with Interdisciplinary Treatment Team  Consent to Intern Participation: N/A  [name] 12/19/2019, 1:56 PM

## 2019-12-20 MED ORDER — LURASIDONE HCL 40 MG PO TABS
80.0000 mg | ORAL_TABLET | Freq: Every day | ORAL | Status: DC
  Administered 2019-12-21 – 2019-12-26 (×6): 80 mg via ORAL
  Filled 2019-12-20 (×6): qty 2

## 2019-12-20 MED ORDER — DIPHENHYDRAMINE HCL 25 MG PO CAPS
50.0000 mg | ORAL_CAPSULE | Freq: Four times a day (QID) | ORAL | Status: DC | PRN
  Administered 2019-12-21 – 2020-01-01 (×9): 50 mg via ORAL
  Filled 2019-12-20 (×9): qty 2

## 2019-12-20 MED ORDER — LORAZEPAM 2 MG/ML IJ SOLN
2.0000 mg | Freq: Four times a day (QID) | INTRAMUSCULAR | Status: DC | PRN
  Administered 2019-12-20 – 2019-12-21 (×2): 2 mg via INTRAMUSCULAR
  Filled 2019-12-20 (×2): qty 1

## 2019-12-20 MED ORDER — HALOPERIDOL 5 MG PO TABS
5.0000 mg | ORAL_TABLET | Freq: Four times a day (QID) | ORAL | Status: DC | PRN
  Administered 2019-12-21 – 2019-12-29 (×4): 5 mg via ORAL
  Filled 2019-12-20 (×5): qty 1

## 2019-12-20 MED ORDER — DIPHENHYDRAMINE HCL 50 MG/ML IJ SOLN
50.0000 mg | Freq: Four times a day (QID) | INTRAMUSCULAR | Status: DC | PRN
  Administered 2019-12-21: 50 mg via INTRAMUSCULAR
  Filled 2019-12-20: qty 1

## 2019-12-20 MED ORDER — HALOPERIDOL LACTATE 5 MG/ML IJ SOLN
5.0000 mg | Freq: Four times a day (QID) | INTRAMUSCULAR | Status: DC | PRN
  Administered 2019-12-21: 5 mg via INTRAMUSCULAR
  Filled 2019-12-20: qty 1

## 2019-12-20 MED ORDER — LORAZEPAM 2 MG PO TABS
2.0000 mg | ORAL_TABLET | Freq: Four times a day (QID) | ORAL | Status: DC | PRN
  Administered 2019-12-21 – 2020-01-03 (×10): 2 mg via ORAL
  Filled 2019-12-20 (×10): qty 1

## 2019-12-20 NOTE — [facility] Counselor (Signed)
CSW met with the patient. Patient reports that her significant other is requesting that he NOT be contacted by staff.  She reports that significant other is okay with being contacted by the patient.   [name], MSW, LCSW 12/20/2019 11:29 AM

## 2019-12-20 NOTE — Progress Notes (Signed)
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 Patient redirected several times and given education.
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
## 2019-12-20 NOTE — Progress Notes (Signed)
D- Patient alert and oriented. Affect/mood is animated and silly. Pt denies SI, HI, AVH, and pain. Pt went to groups and has been social. She was irritable and agitated with another pt on the unit but improved her behavior.    A- Scheduled medications administered to patient, per MD orders. Support and encouragement provided.  Routine safety checks conducted every 15 minutes.  Patient informed to notify staff with problems or concerns.  R- No adverse drug reactions noted. Patient contracts for safety at this time. Patient compliant with medications and treatment plan. Patient receptive, calm, and cooperative. Patient interacts well with others on the unit.  Patient remains safe at this time.  Torrie Mayers RN

## 2019-12-20 NOTE — Progress Notes (Signed)
Patient blaming other stating they are crazy, she isn't crazy. Patient irritable, she is preoccupied with other patients. Patient speaking in the third person stating, "[name] is not here." Patient is hyper religious stating she needs a Catholic priest to come talk to her and protect her and her kids. Patient think her boyfriend and her mom are working together to make everyone think that she is crazy, but she isn't. Patient hypersexual stating, "I would have sex with the tech, Verita Lamb., to get some crackers." Patient given education. Patient started singing as loud as she could in her room. Staff came down and educated her on not waking other patients. Patient stated she would sing at a normal level. Patient continues to come up to the nurses station, manic, needy, and intrusive. Agitated with staff.

## 2019-12-20 NOTE — Progress Notes (Addendum)
CH received OR saying pt. needs to talk; when [facility] met pt. in [facility], pt. immediately eager to speak about her faith in [place] and her [place], particularly her '[place] guilt' which centers around the fact that she and her boyfriend are not married.  In extended visit, Pt. 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  Pt. seems to be involved in some kind of music therapy or education in the school, and several times during the visit she began singing the songs she teaches her students; singing, pt. shared, is her 'happy place'.  Pt. shared that she was involved some time ago in a program that attempted to educate children about sex and relational intimacy through dance.  Pt. has a difficult relationship with her mother 'Marge', who she claims has repeatedly gotten her placed in mental hospitals when she suspects pt. may be about to enter a manic state.  Pt.'s 75yo son lives w/mthr.  Pt. also has two daughters with an ex-husband named Harrell Gave who reportedly left pt. due to conflicts w/pt.'s mother.  Pt.'s current SO Doug lives w/her but has said he will not marry her.  This is especially distressing to pt. because of her [place] in which she observes many married couples; pt. became tearful and then began sobbing in describing her sense of loneliness and shame at not being married in the eyes of the church while living with her partner.  Pt.'s concept of God was difficult to understand: Pt.'s identification as 'God's favorite' seems to carry connotations of particular love and protection but also pressure to be 'perfect'.  Pt. shared that she cannot go to church without feeling manic and so she does not attend.  She has some connection to [employer] in [place].  Pt. says that earlier today she was able to  get phone numbers for some key contacts (SO, church, her school, several friends) and that this has made her feel much less 'trapped'; pt. grateful for [facility] listening to her but says that when she intially requested a visit in the middle of the night she was in a much more fragile state.  [place] made pt. aware of chaplains' availability in case further support is needed.

## 2019-12-20 NOTE — Progress Notes (Signed)
Pt is tearful and worried about her relationship with her boyfriend. She also says that she feels like she is overtly medicated but she knows that the medicine "is supposed to slow her down", Torrie Mayers RN

## 2019-12-20 NOTE — Progress Notes (Signed)
Recreation Therapy Notes  Date: 12/20/2019  Time: 9:30 am   Location: Room 21   Behavioral response: N/A   Intervention Topic: Animal Assisted therapy    Discussion/Intervention: Patient did not attend group.   Clinical Observations/Feedback:  Patient did not attend group.   [name] LRT/CTRS        [name] 12/20/2019 11:37 AM

## 2019-12-20 NOTE — Plan of Care (Signed)
Pt denies depression, anxiety, SI, HI and AVH. Pt was educated on care plan and verbalizes understanding. Pt was encouraged to attend groups. Najeh Credit RN Problem: Education: Goal: Knowledge of [facility] Education information/materials will improve Outcome: Progressing Goal: Emotional status will improve Outcome: Progressing Goal: Mental status will improve Outcome: Progressing Goal: Verbalization of understanding the information provided will improve Outcome: Progressing   Problem: Activity: Goal: Interest or engagement in activities will improve Outcome: Progressing Goal: Sleeping patterns will improve Outcome: Progressing   Problem: Coping: Goal: Ability to verbalize frustrations and anger appropriately will improve Outcome: Progressing Goal: Ability to demonstrate self-control will improve Outcome: Progressing   Problem: Health Behavior/Discharge Planning: Goal: Identification of resources available to assist in meeting health care needs will improve Outcome: Progressing Goal: Compliance with treatment plan for underlying cause of condition will improve Outcome: Progressing   Problem: Physical Regulation: Goal: Ability to maintain clinical measurements within normal limits will improve Outcome: Progressing   Problem: Safety: Goal: Periods of time without injury will increase Outcome: Progressing   Problem: Activity: Goal: Will verbalize the importance of balancing activity with adequate rest periods Outcome: Progressing   Problem: Education: Goal: Will be free of psychotic symptoms Outcome: Progressing Goal: Knowledge of the prescribed therapeutic regimen will improve Outcome: Progressing   Problem: Coping: Goal: Coping ability will improve Outcome: Progressing Goal: Will verbalize feelings Outcome: Progressing   Problem: Health Behavior/Discharge Planning: Goal: Compliance with prescribed medication regimen will improve Outcome: Progressing    Problem: Nutritional: Goal: Ability to achieve adequate nutritional intake will improve Outcome: Progressing   Problem: Role Relationship: Goal: Ability to communicate needs accurately will improve Outcome: Progressing Goal: Ability to interact with others will improve Outcome: Progressing   Problem: Safety: Goal: Ability to redirect hostility and anger into socially appropriate behaviors will improve Outcome: Progressing Goal: Ability to remain free from injury will improve Outcome: Progressing   Problem: Self-Care: Goal: Ability to participate in self-care as condition permits will improve Outcome: Progressing   Problem: Self-Concept: Goal: Will verbalize positive feelings about self Outcome: Progressing   

## 2019-12-20 NOTE — Tx Team (Addendum)
Interdisciplinary Treatment and Diagnostic Plan Update  12/20/2019 Time of Session: 9:00AM [name] MRN: [id number]  Principal Diagnosis: Bipolar I disorder with mania ([place])  Secondary Diagnoses: Principal Problem:   Bipolar I disorder with mania ([place])   Current Medications:  Current Facility-Administered Medications  Medication Dose Route Frequency Provider Last Rate Last Admin  . acetaminophen (TYLENOL) tablet 650 mg  650 mg Oral Q6H PRN [name], [name], MD      . alum & mag hydroxide-simeth (MAALOX/MYLANTA) 200-200-20 MG/5ML suspension 30 mL  30 mL Oral Q4H PRN [name], [name], MD      . atorvastatin (LIPITOR) tablet 10 mg  10 mg Oral Daily [name], [name], MD   10 mg at 12/20/19 0740  . lurasidone (LATUDA) tablet 40 mg  40 mg Oral Q breakfast [name], [name], MD   40 mg at 12/20/19 0739  . magnesium hydroxide (MILK OF MAGNESIA) suspension 30 mL  30 mL Oral Daily PRN [name], [name], MD      . menthol-cetylpyridinium (CEPACOL) lozenge 3 mg  1 lozenge Oral PRN [name], [name], MD      . temazepam (RESTORIL) capsule 15 mg  15 mg Oral QHS PRN [name], [name], MD   15 mg at 12/19/19 2106   PTA Medications: Medications Prior to Admission  Medication Sig Dispense Refill Last Dose  . atorvastatin (LIPITOR) 10 MG tablet Take 10 mg by mouth daily.     [name] levothyroxine (SYNTHROID) 50 MCG tablet Take 50 mcg by mouth daily.     . medroxyPROGESTERone (DEPO-PROVERA) 150 MG/ML injection Inject 1 mL (150 mg total) into the muscle every 3 (three) months. 1 mL 0     Patient Stressors: Marital or family conflict Medication change or noncompliance  Patient Strengths: General fund of knowledge Motivation for treatment/growth Supportive family/friends  Treatment Modalities: Medication Management, Group therapy, Case management,  1 to 1 session with clinician, Psychoeducation, Recreational therapy.   Physician Treatment Plan for Primary Diagnosis: Bipolar I disorder with  mania ([place]) Long Term Goal(s): Improvement in symptoms so as ready for discharge Improvement in symptoms so as ready for discharge   Short Term Goals: Ability to demonstrate self-control will improve Ability to identify and develop effective coping behaviors will improve Compliance with prescribed medications will improve  Medication Management: Evaluate patient's response, side effects, and tolerance of medication regimen.  Therapeutic Interventions: 1 to 1 sessions, Unit Group sessions and Medication administration.  Evaluation of Outcomes: Progressing  Physician Treatment Plan for Secondary Diagnosis: Principal Problem:   Bipolar I disorder with mania ([name])  Long Term Goal(s): Improvement in symptoms so as ready for discharge Improvement in symptoms so as ready for discharge   Short Term Goals: Ability to demonstrate self-control will improve Ability to identify and develop effective coping behaviors will improve Compliance with prescribed medications will improve     Medication Management: Evaluate patient's response, side effects, and tolerance of medication regimen.  Therapeutic Interventions: 1 to 1 sessions, Unit Group sessions and Medication administration.  Evaluation of Outcomes: Progressing   RN Treatment Plan for Primary Diagnosis: Bipolar I disorder with mania ([name]) Long Term Goal(s): Knowledge of disease and therapeutic regimen to maintain health will improve  Short Term Goals: Ability to verbalize frustration and anger appropriately will improve, Ability to demonstrate self-control, Ability to participate in decision making will improve, Ability to verbalize feelings will improve, Ability to identify and develop effective coping behaviors will improve and Compliance with prescribed medications will improve  Medication Management: RN will administer medications as ordered by provider, will assess and evaluate patient's response and provide education to patient for  prescribed medication. RN will report any adverse and/or side effects to prescribing provider.  Therapeutic Interventions: 1 on 1 counseling sessions, Psychoeducation, Medication administration, Evaluate responses to treatment, Monitor vital signs and CBGs as ordered, Perform/monitor CIWA, COWS, AIMS and Fall Risk screenings as ordered, Perform wound care treatments as ordered.  Evaluation of Outcomes: Not Met   LCSW Treatment Plan for Primary Diagnosis: Bipolar I disorder with mania ([name]) Long Term Goal(s): Safe transition to appropriate next level of care at discharge, Engage patient in therapeutic group addressing interpersonal concerns.  Short Term Goals: Engage patient in aftercare planning with referrals and resources, Increase social support, Increase ability to appropriately verbalize feelings, Increase emotional regulation, Facilitate acceptance of mental health diagnosis and concerns and Increase skills for wellness and recovery  Therapeutic Interventions: Assess for all discharge needs, 1 to 1 time with Social worker, Explore available resources and support systems, Assess for adequacy in community support network, Educate family and significant other(s) on suicide prevention, Complete Psychosocial Assessment, Interpersonal group therapy.  Evaluation of Outcomes: Not Met   Progress in Treatment: Attending groups: Yes. Participating in groups: Yes. Taking medication as prescribed: Yes. Toleration medication: Yes. Family/Significant other contact made: Yes, individual(s) contacted:  once permission is given. Patient understands diagnosis: Yes. Discussing patient identified problems/goals with staff: Yes. Medical problems stabilized or resolved: Yes. Denies suicidal/homicidal ideation: Yes. Issues/concerns per patient self-inventory: No. Other: none  New problem(s) identified: No, Describe:  none  New Short Term/Long Term Goal(s): medication management for mood stabilization;  elimination of SI thoughts; development of comprehensive mental wellness/sobriety plan.  Patient Goals:  "to follow though with my medications"  Discharge Plan or Barriers: Patient reports plans to begin treatment and medications once discharged.   Reason for Continuation of Hospitalization: Anxiety Depression Medication stabilization Suicidal ideation  Estimated Length of Stay: 1-7 days  Recreational Therapy: Patient Stressors: N/A Patient Goal: Patient will engage in groups without prompting or encouragement from LRT x3 group sessions within 5 recreation therapy group sessions.   Attendees: Patient: [name] 12/20/2019 11:30 AM  Physician: Dr. Weber Cooks, MD 12/20/2019 11:30 AM  Nursing: Collier Bullock, RN 12/20/2019 11:30 AM  RN Care Manager: 12/20/2019 11:30 AM  Social Worker: Assunta Curtis, LCSW 12/20/2019 11:30 AM  Recreational Therapist: Roanna Epley, CTRS, LRT 12/20/2019 11:30 AM  Other: Sanjuana Kava, LCSW 12/20/2019 11:30 AM  Other:  12/20/2019 11:30 AM  Other: 12/20/2019 11:30 AM    Scribe for Treatment Team: Rozann Lesches, LCSW 12/20/2019 11:30 AM

## 2019-12-21 MED ORDER — LITHIUM CARBONATE ER 450 MG PO TBCR
450.0000 mg | EXTENDED_RELEASE_TABLET | Freq: Every day | ORAL | Status: DC
  Administered 2019-12-22 – 2019-12-23 (×2): 450 mg via ORAL
  Filled 2019-12-21 (×3): qty 1

## 2019-12-21 NOTE — Progress Notes (Signed)
Recreation Therapy Notes  Date: 12/21/2019  Time: 9:30 am   Location: Craft room     Behavioral response: N/A   Intervention Topic: Self-care  Discussion/Intervention: Patient did not attend group.   Clinical Observations/Feedback:  Patient did not attend group.   [name] LRT/CTRS        [name] 12/21/2019 12:16 PM
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
## 2019-12-21 NOTE — Progress Notes (Signed)
Pt is alert and oriented to person, but is confused to time, place, and situation. Pt wanders around the hallways slowly, thinks MHT is her "friend." Pt is intrusive at times, attempts to follow staff into the nurses station, when told by staff that she would be with her after report, "Can I listen to report too?" Pt walks around with her eyes closed at times, when asked about this bizzare behavior, pt states, "My eyes are from 1980 and do not work." At other times, pt is walking with her eyes open. Pt walks around with notebooks and papers in her hand, when asked why she is wandering around with these books, pt states, "I am reading about how to seduce a man." At other times it was reported by MHT's that pt makes hissing sounds at them. Pt is calm, requires redirections at times, wanders into the medication room, when another client is at the counter taking meds and will begin mumbling and talking to herself. No distress noted, none reported, will continue to monitor pt per Q15 minute face checks and monitor for safety and progress.

## 2019-12-22 NOTE — [facility] Group Notes (Signed)
[facility] Group Notes:  (Nursing/MHT/Case Management/Adjunct)  Date:  12/22/2019  Time:  9:01 PM  Type of Therapy:  Group Therapy  Participation Level:  Active  Participation Quality:  Drowsy  Affect:  Appropriate  Cognitive:  Appropriate  Insight:  Good  Engagement in Group:  Engaged and her goal was to clean up and rest a little and to wash clothes.  Modes of Intervention:  Support  Summary of Progress/Problems:  [name] 12/22/2019, 9:01 PM
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
## 2019-12-22 NOTE — Progress Notes (Signed)
Patient alert and oriented x 3 with periods of confusion to situation, she appears less anxious and less intrusive,she was noted in dayroom calmer interacting appropriately with peers. Patient thoughts are still disorganized, speech is tangential and pressured she denies SI/HI/AVH but appears responding to internal stimuli, she was complaint with medication regimen except lithium that she refused to take  she stated " it gives me diarrrhea and makes me sick"  She' s not disruptive on the unit, emotional support given, 15 minutes safety checks maintained will continue to monitor.

## 2019-12-22 NOTE — [facility] Group Notes (Signed)
LCSW Group Therapy Note  12/22/2019   1:21 PM- 2:07 PM   Type of Therapy and Topic:  Group Therapy: Anger Cues and Responses  Participation Level:  Active   Description of Group:   In this group, patients learned how to recognize the physical, cognitive, emotional, and behavioral responses they have to anger-provoking situations.  They identified a recent time they became angry and how they reacted.  They analyzed how their reaction was possibly beneficial and how it was possibly unhelpful.  The group discussed a variety of healthier coping skills that could help with such a situation in the future.  Focus was placed on how helpful it is to recognize the underlying emotions to our anger, because working on those can lead to a more permanent solution as well as our ability to focus on the important rather than the urgent.  Therapeutic Goals: 1. Patients will remember their last incident of anger and how they felt emotionally and physically, what their thoughts were at the time, and how they behaved. 2. Patients will identify how their behavior at that time worked for them, as well as how it worked against them. 3. Patients will explore possible new behaviors to use in future anger situations. 4. Patients will learn that anger itself is normal and cannot be eliminated, and that healthier reactions can assist with resolving conflict rather than worsening situations.  Summary of Patient Progress:  Patient checked into group wanting to learn how to set boundaries and learn how to mind her own business. Patient stated what makes her angry is when staff is not consistent. Patient felt all staff should be on the same page. Patient stated that some staff care more then others and she felt some are only here for a paycheck. Patient also spoke about her ex husband and being angry about not having her children full time. Patient stated she was opened to family counseling. Patient would like to add meditation  and massages as her coping skills.    Therapeutic Modalities:   Cognitive Behavioral Therapy    Susa Simmonds, LCSWA 12/22/2019  5:02 PM

## 2019-12-22 NOTE — Progress Notes (Signed)
Pt is alert and oriented to person, place, time and situation. Pt is calm, cooperative, pleasant, denies suicidal and homicidal ideation, denies hallucinations, denies feelings of anxiety and depression. Pt is focused on discharge. Pt makes poor eye contact, and has a flat affect, thoughts are noted to be less disorganized than yesterday. Pt's appetite is good. Pt ismedication complaint with a great deal of encouragement and education. No distress noted, none reported, will continue to monitor pt per Q15 minute face checks and monitor for safety and progress.

## 2019-12-23 NOTE — Plan of Care (Signed)
  Problem: Education: Goal: Knowledge of [facility] Education information/materials will improve Outcome: Progressing Goal: Emotional status will improve Outcome: Progressing Goal: Mental status will improve Outcome: Progressing Goal: Verbalization of understanding the information provided will improve Outcome: Progressing   Problem: Activity: Goal: Interest or engagement in activities will improve Outcome: Progressing Goal: Sleeping patterns will improve Outcome: Progressing   

## 2019-12-23 NOTE — Progress Notes (Signed)
Patient has been anxious. Very reluctantly took her Lithium. Stated that it "messed her up" when she took it before so that she was in a wheelchair. Continued to perseverate about not wanting to take her lithium and claims that the doctor lied to her because he told her that he would discontinue it.

## 2019-12-23 NOTE — Plan of Care (Addendum)
Patient has been in the milieu. Alert and alert but continues to experience anxiety and restlessness. Presented to the medication room and reported that she is feeling less anxious. 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 then I I started dancing to manage him...the kid loved me because I was able to keep him calm..." Patient admits that she needed her medications. Unsure if she will return to work "not sure if they will let me...". Patient has been cooperative and compliant with treatment.  Eating well and active in the milieu. Safety monitored as recommended.

## 2019-12-23 NOTE — [facility] Group Notes (Signed)
[facility] Group Notes:  (Nursing/MHT/Case Management/Adjunct)  Date:  12/23/2019  Time:  10:05 PM  Type of Therapy:  Group Therapy  Participation Level:  Active  Participation Quality:  Appropriate and Sharing  Affect:  Appropriate  Cognitive:  Alert  Insight:  Good  Engagement in Group:  Engaged and her goal is to go in the morning.  Modes of Intervention:  Socialization and Support  Summary of Progress/Problems:  [name] 12/23/2019, 10:05 PM

## 2019-12-23 NOTE — [facility] Suicide Risk Assessment (Signed)
[facility] INPATIENT:  Family/Significant Other Suicide Prevention Education  Suicide Prevention Education:  Education Completed; [name],  (Boyfriend) has been identified by the patient as the family member/significant other with whom the patient will be residing, and identified as the person(s) who will aid the patient in the event of a mental health crisis (suicidal ideations/suicide attempt).  With written consent from the patient, the family member/significant other has been provided the following suicide prevention education, prior to the and/or following the discharge of the patient.  The suicide prevention education provided includes the following:  Suicide risk factors  Suicide prevention and interventions  National Suicide Hotline telephone number  [facility] assessment telephone number  [facility] Emergency Assistance 911  [facility] and/or Residential Mobile Crisis Unit telephone number  Request made of family/significant other to:  Remove weapons (e.g., guns, rifles, knives), all items previously/currently identified as safety concern.    Remove drugs/medications (over-the-counter, prescriptions, illicit drugs), all items previously/currently identified as a safety concern.  The family member/significant other verbalizes understanding of the suicide prevention education information provided.  The family member/significant other agrees to remove the items of safety concern listed above.  [name]  [name] 12/23/2019, 5:33 PM
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
## 2019-12-23 NOTE — [facility] Group Notes (Signed)
[facility] LCSW Group Therapy Note  Date/Time:  12/23/2019 1:20 PM- 2:01 PM   Type of Therapy and Topic:  Group Therapy:  Healthy and Unhealthy Supports  Participation Level:  Active   Description of Group:  Patients in this group were introduced to the idea of adding a variety of healthy supports to address the various needs in their lives.Patients discussed what additional healthy supports could be helpful in their recovery and wellness after discharge in order to prevent future hospitalizations.   An emphasis was placed on using counselor, doctor, therapy groups, 12-step groups, and problem-specific support groups to expand supports.  They also worked as a group on developing a specific plan for several patients to deal with unhealthy supports through boundary-setting, psychoeducation with loved ones, and even termination of relationships.   Therapeutic Goals:   1)  discuss importance of adding supports to stay well once out of the hospital  2)  compare healthy versus unhealthy supports and identify some examples of each  3)  generate ideas and descriptions of healthy supports that can be added  4)  offer mutual support about how to address unhealthy supports  5)  encourage active participation in and adherence to discharge plan    Summary of Patient Progress:  Patient checked into group feeling tired. Patient spoke about her mother being a healthy and unhealthy support in her life. Patient stated she has no set boundaries with her mother. Patient also spoke about her children being a support because they are older and able to help her. Patient stated that she would like to reconnect with some old friends as support and go to [facility].    Therapeutic Modalities:   Motivational Interviewing Brief Solution-Focused Therapy  Sharman Cheek 12/23/2019  4:33 PM

## 2019-12-24 LAB — LITHIUM LEVEL: Lithium Lvl: 0.28 mmol/L — ABNORMAL LOW (ref 0.60–1.20)

## 2019-12-24 MED ORDER — LITHIUM CARBONATE ER 300 MG PO TBCR
600.0000 mg | EXTENDED_RELEASE_TABLET | Freq: Every day | ORAL | Status: DC
  Administered 2019-12-24: 600 mg via ORAL
  Filled 2019-12-24: qty 2

## 2019-12-24 NOTE — Progress Notes (Signed)
Patient has been much more manic than yesterday. Mood is euphoric. Intrusive. Inserting herself into other patient's interactions with staff. 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 Asking me to "look at her butt" because she felt she had a yeast infection, then asked for a hair dryer because she thought she had a yeast infection in her hair. Thought she had a rash on her arm, but no rash is visible. After numerous prn's patient finally went to sleep.

## 2019-12-24 NOTE — Progress Notes (Signed)
Recreation Therapy Notes   Date: 12/24/2019  Time: 9:30 am  Location: Court yard   Behavioral response: Appropriate  Intervention Topic: Leisure    Discussion/Intervention:  Group content today was focused on leisure. The group defined what leisure is and some positive leisure activities they participate in. Individuals identified the difference between good and bad leisure. Participants expressed how they feel after participating in the leisure of their choice. The group discussed how they go about picking a leisure activity and if others are involved in their leisure activities. The patient stated how many leisure activities they have to choose from and reasons why it is important to have leisure time. Individuals participated in the intervention "Exploration of Leisure" where they had a chance to identify new leisure activities as well as benefits of leisure. Clinical Observations/Feedback:  Patient came to group and was focus on the concept of leisure and ways to improve leisure experiences. Participant focused on many leisure opportunities and ways to incorporate leisure in one's lifestyle. Individual was social with peers and staff while participating in the intervention. Mahi Zabriskie LRT/CTRS         Cheynne Virden 12/24/2019 12:52 PM

## 2019-12-24 NOTE — Plan of Care (Signed)
  Problem: Education: Goal: Knowledge of [place] [facility] Education information/materials will improve Outcome: Not Progressing Goal: Emotional status will improve Outcome: Not Progressing Goal: Mental status will improve Outcome: Not Progressing Goal: Verbalization of understanding the information provided will improve Outcome: Not Progressing   Problem: Activity: Goal: Interest or engagement in activities will improve Outcome: Not Progressing Goal: Sleeping patterns will improve Outcome: Not Progressing   

## 2019-12-24 NOTE — Plan of Care (Signed)
Patient is walking in the hallway slowly,appears sad and tearful.Patient stated that she never be with her daughters when their first day of school.Denies SI,HI and AVH.Appropriate with staff & peers.Compliant with medications.Appetite good.Support and encouragement given.

## 2019-12-24 NOTE — [facility] Counselor (Signed)
CSW spoke with the patient about the phone call received from the [employer] Department of the patient's job.  CSW pointed out that CSW solely provided information on FMLA and where to send it at the request of the patient.  CSW informed that HR Benedict Needy left her contact information for the patient and explained that the school staff reportedly wanted to speak with the patient about "things out of our control" and education on how to best speak with the patient without triggering her further.  Pt reports "it's always the same" and walked away from CSW.  Penni Homans, MSW, LCSW 12/24/2019 3:06 PM
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
## 2019-12-25 MED ORDER — LITHIUM CARBONATE ER 450 MG PO TBCR
900.0000 mg | EXTENDED_RELEASE_TABLET | Freq: Every day | ORAL | Status: DC
  Administered 2019-12-25 – 2020-01-03 (×10): 900 mg via ORAL
  Filled 2019-12-25 (×9): qty 2

## 2019-12-25 NOTE — [facility] Group Notes (Signed)
[facility] Group Notes:  (Nursing/MHT/Case Management/Adjunct)  Date:  12/25/2019  Time:  10:32 PM  Type of Therapy:  Group Therapy  Participation Level:  Active  Participation Quality:  Appropriate  Affect:  Appropriate  Cognitive:  Alert  Insight:  Good  Engagement in Group:  Engaged  Modes of Intervention:  Support  Summary of Progress/Problems:  [name] 12/25/2019, 10:32 PM

## 2019-12-25 NOTE — Progress Notes (Signed)
Recreation Therapy Notes  Date: 08/24//2021  Time: 9:30 am  Location: Craft room   Behavioral response: Appropriate  Intervention Topic: Relaxation   Discussion/Intervention:  Group content today was focused on relaxation. The group defined relaxation and identified healthy ways to relax. Individuals expressed how much time they spend relaxing. Patients expressed how much their life would be if they did not make time for themselves to relax. The group stated ways they could improve their relaxation techniques in the future.  Individuals participated in the intervention "Time to Relax" where they had a chance to experience different relaxation techniques.  Clinical Observations/Feedback:  Patient came to group late and expressed that noises and racing thoughts keep her from relaxing.   Individual was social with peers and staff while participating in the intervention. Amro Winebarger LRT/CTRS         Azlan Hanway 12/25/2019 11:53 AM

## 2019-12-25 NOTE — Plan of Care (Signed)
  Problem: Communication Goal: STG - Patient will demonstrate improved communication skills by spontaneously contributing to 2 group discussions within 5 recreation therapy group sessions Description: STG - Patient will demonstrate improved communication skills by spontaneously contributing to 2 group discussions within 5 recreation therapy group sessions Outcome: Progressing   

## 2019-12-25 NOTE — Plan of Care (Signed)
  Problem: Education: Goal: Knowledge of  General Education information/materials will improve Outcome: Not Progressing Goal: Emotional status will improve Outcome: Not Progressing Goal: Mental status will improve Outcome: Not Progressing Goal: Verbalization of understanding the information provided will improve Outcome: Not Progressing   Problem: Activity: Goal: Interest or engagement in activities will improve Outcome: Not Progressing Goal: Sleeping patterns will improve Outcome: Not Progressing   

## 2019-12-25 NOTE — Progress Notes (Signed)
Patient has been increasingly needy and intrusive.Presenting with multiple somatic complaints--a non-visible rash she insists is there, yeast infection on her bottom, back and scalp--among others. Redirectable for short periods of time. Thought processes are loose and tangential. Denies SI , HI and AVH.

## 2019-12-25 NOTE — Progress Notes (Signed)
   12/25/19 1400  Clinical Encounter Type  Visited With Patient  Visit Type Initial;Spiritual support;Social support;Behavioral Health  Referral From Chaplain  Consult/Referral To Chaplain  Pt attended Ch group today. The subject discussed was love. Pt was evolved in conversation. Ch will follow-up.  
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
## 2019-12-25 NOTE — Tx Team (Signed)
Interdisciplinary Treatment and Diagnostic Plan Update  12/25/2019 Time of Session: 9:00AM [name] MRN: [id number]  Principal Diagnosis: Bipolar I disorder with mania (HCC)  Secondary Diagnoses: Principal Problem:   Bipolar I disorder with mania (HCC)   Current Medications:  Current Facility-Administered Medications  Medication Dose Route Frequency Provider Last Rate Last Admin  . acetaminophen (TYLENOL) tablet 650 mg  650 mg Oral Q6H PRN [name], [name], MD      . alum & mag hydroxide-simeth (MAALOX/MYLANTA) 200-200-20 MG/5ML suspension 30 mL  30 mL Oral Q4H PRN [name], Jackquline Denmark, MD   30 mL at 12/23/19 1955  . atorvastatin (LIPITOR) tablet 10 mg  10 mg Oral Daily [name], Jackquline Denmark, MD   10 mg at 12/25/19 0804  . diphenhydrAMINE (BENADRYL) capsule 50 mg  50 mg Oral Q6H PRN [name], [name], MD   50 mg at 12/23/19 2143   Or  . diphenhydrAMINE (BENADRYL) injection 50 mg  50 mg Intramuscular Q6H PRN [name], [name], MD   50 mg at 12/21/19 0244  . haloperidol (HALDOL) tablet 5 mg  5 mg Oral Q6H PRN [name], [name], MD   5 mg at 12/23/19 2143   Or  . haloperidol lactate (HALDOL) injection 5 mg  5 mg Intramuscular Q6H PRN [name], Jackquline Denmark, MD   5 mg at 12/21/19 0244  . lithium carbonate (LITHOBID) CR tablet 600 mg  600 mg Oral QHS [name], [name], MD   600 mg at 12/24/19 2054  . LORazepam (ATIVAN) tablet 2 mg  2 mg Oral Q6H PRN [name], Jackquline Denmark, MD   2 mg at 12/24/19 0058   Or  . LORazepam (ATIVAN) injection 2 mg  2 mg Intramuscular Q6H PRN [name], Jackquline Denmark, MD   2 mg at 12/21/19 0243  . lurasidone (LATUDA) tablet 80 mg  80 mg Oral Q breakfast [name], Jackquline Denmark, MD   80 mg at 12/25/19 0804  . magnesium hydroxide (MILK OF MAGNESIA) suspension 30 mL  30 mL Oral Daily PRN [name], Jackquline Denmark, MD   30 mL at 12/25/19 0432  . menthol-cetylpyridinium (CEPACOL) lozenge 3 mg  1 lozenge Oral PRN [name], Jackquline Denmark, MD   3 mg at 12/25/19 0607  . temazepam (RESTORIL) capsule 15 mg  15 mg Oral QHS  PRN [name], Jackquline Denmark, MD   15 mg at 12/24/19 2333   PTA Medications: Medications Prior to Admission  Medication Sig Dispense Refill Last Dose  . atorvastatin (LIPITOR) 10 MG tablet Take 10 mg by mouth daily.     [name] levothyroxine (SYNTHROID) 50 MCG tablet Take 50 mcg by mouth daily.     . medroxyPROGESTERone (DEPO-PROVERA) 150 MG/ML injection Inject 1 mL (150 mg total) into the muscle every 3 (three) months. 1 mL 0     Patient Stressors: Marital or family conflict Medication change or noncompliance  Patient Strengths: General fund of knowledge Motivation for treatment/growth Supportive family/friends  Treatment Modalities: Medication Management, Group therapy, Case management,  1 to 1 session with clinician, Psychoeducation, Recreational therapy.   Physician Treatment Plan for Primary Diagnosis: Bipolar I disorder with mania (HCC) Long Term Goal(s): Improvement in symptoms so as ready for discharge Improvement in symptoms so as ready for discharge   Short Term Goals: Ability to demonstrate self-control will improve Ability to identify and develop effective coping behaviors will improve Compliance with prescribed medications will improve  Medication Management: Evaluate patient's response, side effects, and tolerance of medication regimen.  Therapeutic Interventions: 1  to 1 sessions, Unit Group sessions and Medication administration.  Evaluation of Outcomes: Progressing  Physician Treatment Plan for Secondary Diagnosis: Principal Problem:   Bipolar I disorder with mania (HCC)  Long Term Goal(s): Improvement in symptoms so as ready for discharge Improvement in symptoms so as ready for discharge   Short Term Goals: Ability to demonstrate self-control will improve Ability to identify and develop effective coping behaviors will improve Compliance with prescribed medications will improve     Medication Management: Evaluate patient's response, side effects, and tolerance of  medication regimen.  Therapeutic Interventions: 1 to 1 sessions, Unit Group sessions and Medication administration.  Evaluation of Outcomes: Progressing   RN Treatment Plan for Primary Diagnosis: Bipolar I disorder with mania (HCC) Long Term Goal(s): Knowledge of disease and therapeutic regimen to maintain health will improve  Short Term Goals: Ability to verbalize frustration and anger appropriately will improve, Ability to demonstrate self-control, Ability to participate in decision making will improve, Ability to verbalize feelings will improve, Ability to identify and develop effective coping behaviors will improve and Compliance with prescribed medications will improve  Medication Management: RN will administer medications as ordered by provider, will assess and evaluate patient's response and provide education to patient for prescribed medication. RN will report any adverse and/or side effects to prescribing provider.  Therapeutic Interventions: 1 on 1 counseling sessions, Psychoeducation, Medication administration, Evaluate responses to treatment, Monitor vital signs and CBGs as ordered, Perform/monitor CIWA, COWS, AIMS and Fall Risk screenings as ordered, Perform wound care treatments as ordered.  Evaluation of Outcomes: Progressing   LCSW Treatment Plan for Primary Diagnosis: Bipolar I disorder with mania (HCC) Long Term Goal(s): Safe transition to appropriate next level of care at discharge, Engage patient in therapeutic group addressing interpersonal concerns.  Short Term Goals: Engage patient in aftercare planning with referrals and resources, Increase social support, Increase ability to appropriately verbalize feelings, Increase emotional regulation, Facilitate acceptance of mental health diagnosis and concerns and Increase skills for wellness and recovery  Therapeutic Interventions: Assess for all discharge needs, 1 to 1 time with Social worker, Explore available resources and  support systems, Assess for adequacy in community support network, Educate family and significant other(s) on suicide prevention, Complete Psychosocial Assessment, Interpersonal group therapy.  Evaluation of Outcomes: Progressing   Progress in Treatment: Attending groups: Yes. Participating in groups: Yes. Taking medication as prescribed: Yes. Toleration medication: Yes. Family/Significant other contact made: Yes, individual(s) contacted:  once permission is given. Patient understands diagnosis: Yes. Discussing patient identified problems/goals with staff: Yes. Medical problems stabilized or resolved: Yes. Denies suicidal/homicidal ideation: Yes. Issues/concerns per patient self-inventory: No. Other: none  New problem(s) identified: No, Describe:  none  New Short Term/Long Term Goal(s): medication management for mood stabilization; elimination of SI thoughts; development of comprehensive mental wellness/sobriety plan.  Update 12/25/2019:  No changes at this time.   Patient Goals:  "to follow though with my medications"  Update 12/25/2019: No changes at this time.  Discharge Plan or Barriers: Patient reports plans to begin treatment and medications once discharged.  Update 12/25/2019: Patient reports plans to return to her home.  She will not be able to continue with her previous mental health providers, [facility], due to noncompliance with treatment recommendations.  Patient has identified that she would like to follow up with [facility] at discharge.   Reason for Continuation of Hospitalization: Anxiety Depression Medication stabilization Suicidal ideation  Estimated Length of Stay: 1-7 days  Recreational Therapy: Patient Stressors: N/A Patient Goal: Patient will  engage in groups without prompting or encouragement from LRT x3 group sessions within 5 recreation therapy group sessions.   Attendees: Patient:  12/25/2019 1:19 PM  Physician: Dr. Toni Amend, MD 12/25/2019 1:19 PM  Nursing:   12/25/2019 1:19 PM  RN Care Manager: 12/25/2019 1:19 PM  Social Worker: Penni Homans, LCSW 12/25/2019 1:19 PM  Recreational Therapist:  12/25/2019 1:19 PM  Other:  12/25/2019 1:19 PM  Other:  12/25/2019 1:19 PM  Other: 12/25/2019 1:19 PM    Scribe for Treatment Team: Harden Mo, LCSW 12/25/2019 1:19 PM

## 2019-12-25 NOTE — Plan of Care (Signed)
Patient is in & out of room.As per MHT patient had inappropriate conversation with a peer.Patients mood is labile.Sad and tearful for not getting discharged.Blaming mother and ex husband for various reasons.Thoughts are disorganized.Denies SI,HI and AVH.Compliant with medications.Attended groups.Support and encouragement given.

## 2019-12-26 MED ORDER — LURASIDONE HCL 40 MG PO TABS
120.0000 mg | ORAL_TABLET | Freq: Every day | ORAL | Status: DC
  Administered 2019-12-27 – 2019-12-29 (×3): 120 mg via ORAL
  Filled 2019-12-26 (×3): qty 3

## 2019-12-26 NOTE — Progress Notes (Signed)
Patient pleasant during assessment denying SI/HI/AVH. Patient endorses anxiety and depression stating it's from being in here and missing her daughters first day of school and maybe her birthday which is coming up. Patient given education, support, and encouragement to be active in her treatment plan. Patient had a crying spell about still being in here. Pt given support. Patient compliant with medication administration per MD orders. Patient being monitored Q 15 minutes for safety per unit protocol. Pt remains safe on the unit.

## 2019-12-26 NOTE — Progress Notes (Signed)
Patient alert and oriented x 4, affect is flat but she brightens upon approach, her thoughts are organized and coherent this shift no bizarre behavior or loud outburst she appears less intrusive interacting appropriately with peers and staff.patient was offered emotional support and encouragement, 15 minutes safety checks maintained will continue to monitor.
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
## 2019-12-26 NOTE — Plan of Care (Signed)
Patient intrusive and needy with staff  Problem: Education: Goal: Emotional status will improve 12/26/2019 2000 by Elmyra Ricks, RN Outcome: Not Progressing Goal: Mental status will improve 12/26/2019 2000 by Elmyra Ricks, RN Outcome: Not Progressing

## 2019-12-26 NOTE — Progress Notes (Signed)
Recreation Therapy Notes   Date: 12/26/2019  Time: 9:30 am  Location: Craft room   Behavioral response: Appropriate  Intervention Topic: Problem Solving    Discussion/Intervention:  Group content on today was focused on problem solving. The group described what problem solving is. Patients expressed how problems affect them and how they deal with problems. Individuals identified healthy ways to deal with problems. Patients explained what normally happens to them when they do not deal with problems. The group expressed reoccurring problems for them. The group participated in the intervention "Ways to Solve problems" where patients were given a chance to explore different ways to solve problems.  Clinical Observations/Feedback:  Patient came to group late due to unknown reasons, she was asleep half of her time in group. Individual participated in the intervention during her time awake.   Denna Fryberger LRT/CTRS         Khloie Hamada 12/26/2019 1:15 PM

## 2019-12-26 NOTE — Progress Notes (Signed)
D: Pt alert and oriented x 4. Pt rates depression 1/10, hopelessness 0/10, and anxiety 1/10.Pt goal: "Completing and returning FMLA paperwork." Pt reports energy level as normal and concentration as being good. Pt reports sleep last night as being good. Pt didt receive medications for sleep and did find them helpful. Pt reports experiencing 4/10 headache pain in which she wanted to see if it would resolve with food and drink. When it did not pt asked for and received tylenol which was found helpful. Pt denies experiencing any SI/HI, or AVH at this time.   A: Scheduled medications administered to pt, per MD orders. Support and encouragement provided. Frequent verbal contact made. Routine safety checks conducted q15 minutes.   R: No adverse drug reactions noted. Pt verbally contracts for safety at this time. Pt complaint with medications and treatment plan. Pt interacts well with others on the unit. Pt remains safe at this time. Will continue to monitor.  Pt continues to come to the nurses station multiple times throughout the day for different reasons.

## 2019-12-27 MED ORDER — TEMAZEPAM 15 MG PO CAPS
15.0000 mg | ORAL_CAPSULE | Freq: Every day | ORAL | Status: DC
  Administered 2019-12-27: 15 mg via ORAL
  Filled 2019-12-27: qty 1

## 2019-12-27 NOTE — Progress Notes (Addendum)
Patient continues to come up to the nurses station with disorganized thoughts and presenting in manic episode, silly, and euphoric. Patient redirectable to her room but had to be educated on singing in her room as to not wake up the other patients.

## 2019-12-27 NOTE — Progress Notes (Signed)
BRIEF PHARMACY NOTE   This patient attended and participated in Medication Management Group counseling led by [facility] staff pharmacist.  This interactive class reviews basic information about prescription medications and education on personal responsibility in medication management.  The class also includes general knowledge of 3 main classes of behavioral medications, including antipsychotics, antidepressants, and mood stabilizers.     Patient behavior was appropriate for group setting.   Educational materials sourced from:  "Medication Do's and Don'ts" from [email or web address]   "Mental Health Medications" from [facility] FaxRack.[email or web address].shtml#part [email or web address]     Albina Billet ,PharmD 12/27/2019 , 3:32 PM

## 2019-12-27 NOTE — Progress Notes (Signed)
Patient continued to come up to the nurses station irritable, rapid speech and intrusive. Patient given education. Patient refusing to go to bed. Patient compliant with taking PRN medications.

## 2019-12-27 NOTE — Progress Notes (Signed)
Recreation Therapy Notes   Date: 12/27/2019  Time: 9:30 am  Location: Craft room   Behavioral response: Appropriate, lethargic  Intervention Topic: Creative Expressions  Discussion/Intervention:  Group content on today was focused on creative expressions. The group defined creative expressions and ways they use creative expressions. Individual identified other positive ways creative expressions can be used and why it is important to express yourself. Patients participated in the intervention "expressive folding", where they had a chance to creatively express themselves. Clinical Observations/Feedback:  Patient came to group and defined creative expressions as a way to use art to express how you are feeling. She explained that creative expression are a gift that makes up humans. Participant slept on and off during group.Individual was social with peers and staff while participating in the intervention. Lurlene Ronda LRT/CTRS         Zayana Salvador 12/27/2019 12:37 PM

## 2019-12-27 NOTE — Progress Notes (Signed)
D: Pt alert and oriented x 4. Pt rates depression 5/10, hopelessness 2/10, and anxiety 3/10. Pt reports energy level as normal and concentration as being poor. Pt reports sleep last night as being good. Pt did receive medications for sleep and did find them helpful. Pt denies experiencing any pain at this time. Pt denies experiencing any SI/HI, or AVH at this time.   A: Scheduled medications administered to pt, per MD orders. Support and encouragement provided. Frequent verbal contact made. Routine safety checks conducted q15 minutes.   R: No adverse drug reactions noted. Pt verbally contracts for safety at this time. Pt complaint with medications and treatment plan. Pt interacts well with others on the unit. Pt remains safe at this time. Will continue to monitor.   Pt's neediest and intrusiveness has progressed as the day progresses. Pt has come into the medroom asking if she could put on a concert for the unit. Pt later came to this writer with a composition book in hand asking for a new one stating that this one wasn't her and that another pt had ripped the used pages out and gave it to her. When asked why she needed a new composition book if she currently had one, pt replied because she had sexually stuff in there and wanted to make a sleep schedule plan because there is a show on that comes on at 10PM that everyone else has agreed they're okay to watch. Pt's was told that she should be more concerned with going to sleep and getting good sleep that watching the show should not be a priority. Pt continued with concern for watching this show. Pt was told that she would have to talk to night shift and if the dayroom would be open at that time. Pt stated night shift doesn't close the dayroom. This Clinical research associate replied that night shift does have a scheduled time in which they close the dayroom so that pt's will sleep, just as we close it during the day to encourage pt's to attend groups. Pt then changed subject  stating the MD stated he would not give her meds for a fungal infection she has on her abdomin. This Clinical research associate asked to see the location. Pt appears to have light flesh colored spot that appear to be stretch marks on her abdomin. It does not appear to be a fungal infection. Pt stated that at home her physician has instructed her to wash with head and shoulders. This Clinical research associate informed the pt we do not have any head and shoulders here for her to wash with. Pt then continued stating she also gets yeast infections under her abdominal fold and inflammation on her back that she scratches. Pt back was observed, there is a faint redness. Pt states she scratches her back when it's inflamed r/t dryness. Pt instructed to utilized her hospital lotion to help sooth, keep it moisturized, and to not scratch that area or it can become worse. Pt also observed singing in the hall. Good sleep hygiene and personal hygiene was discussed. Pt has poor insight and does not appear accepting of information.
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
## 2019-12-28 LAB — LITHIUM LEVEL: Lithium Lvl: 0.82 mmol/L (ref 0.60–1.20)

## 2019-12-28 MED ORDER — TEMAZEPAM 15 MG PO CAPS
15.0000 mg | ORAL_CAPSULE | Freq: Every evening | ORAL | Status: DC | PRN
  Administered 2019-12-29 – 2020-01-03 (×6): 15 mg via ORAL
  Filled 2019-12-28 (×6): qty 1
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
## 2019-12-28 NOTE — Progress Notes (Signed)
[name] relieved [name] from this visit. Ch Cora had another Pg to respond to. Pt was very emotional about missing her child's birthday. Pt said that she is taking her medication. She is ready to go home. I prayed for Pt and she felt a lot better. Ch will follow-up with Pt.

## 2019-12-28 NOTE — Plan of Care (Signed)
Patient continues to pace in the hallway, labile. Restless and helpless. Frequently visiting the nurses station. Patient reported that she has incontinence and currently using pads. Patient is alert and oriented and denying suicidal thoughts. Denied hallucinations. She is knowledgeable of her medication regimen and able to communicate needs and concerns. Staff continue to provide support and encouragements. Safety monitored as recommended.

## 2019-12-28 NOTE — Progress Notes (Addendum)
Pt is alert and oriented to person, place, time, but not to situation. Pt has very poor insight, unable to report a reason for admission, states, "I don't know." Pt is focused on discharge, reports she is going to talk to the doctor about going home. Pt spends time wandering the hallways, holding papers and booklets, is medication complaint, is intrusive at the nurses station, stares in the window at staff, when asked if she can me helped, pt becomes selectively mute. Pt denies suicidal and homicidal ideation, denies hallucinations, denies feelings of depression and anxiety. Pt is calm, cooperative, irritable. No distress noted, none reported. Will continue to monitor pt per Q15 minute face checks and monitor for safety and progress.   Pt requested chaplin, chaplin called and place pt on the list to see. Pt discussed her confusion about the rules in the handbook and that was discussed with pt as well. Pt did not verbalize understanding, and pt reported her concentration is poor due to not sleeping or days. Pt reports that she will talk later about her book and wanted to go back to her room to nap. Will continue to monitor pt for safety and progress.

## 2019-12-28 NOTE — [facility] Group Notes (Signed)
[facility] Group Notes:  (Nursing/MHT/Case Management/Adjunct)  Date:  12/28/2019  Time:  10:03 PM  Type of Therapy:  Group Therapy  Participation Level:  Active  Participation Quality:  Appropriate  Affect:  Appropriate  Cognitive:  Alert  Insight:  Good  Engagement in Group:  Engaged  Modes of Intervention:  Support  Summary of Progress/Problems:  [name] 12/28/2019, 10:03 PM

## 2019-12-28 NOTE — Progress Notes (Signed)
Patient alert and oriented x 3 with periods of confusion to situation, she appears less anxious and less intrusive,she was calm in the milieu  interacting appropriately with peers and staff. Patient's thoughts are still disorganized, with flight of ideas noted, speech is tangential non pressured,  she denies SI/HI/AVH but appears responding to internal stimuli, she was complaint with medication regimen. Emotional support given, 15 minutes safety checks maintained will continue tomonitor.

## 2019-12-29 MED ORDER — LURASIDONE HCL 40 MG PO TABS
80.0000 mg | ORAL_TABLET | Freq: Two times a day (BID) | ORAL | Status: DC
  Administered 2019-12-29 – 2020-01-04 (×12): 80 mg via ORAL
  Filled 2019-12-29 (×12): qty 2

## 2019-12-29 NOTE — Progress Notes (Signed)
Patient has remained restless and unable to fall asleep. Increasingly labile and intrusive, forcing to enter the nurses station. Frequently redirected. Oncoming nurse [facility]) to follow up.

## 2019-12-29 NOTE — Tx Team (Signed)
Interdisciplinary Treatment and Diagnostic Plan Update  12/29/2019 Time of Session: 10:00 AM [name] MRN: [id number]  Principal Diagnosis: Bipolar I disorder with mania (HCC)  Secondary Diagnoses: Principal Problem:   Bipolar I disorder with mania (HCC)   Current Medications:  Current Facility-Administered Medications  Medication Dose Route Frequency Provider Last Rate Last Admin  . acetaminophen (TYLENOL) tablet 650 mg  650 mg Oral Q6H PRN [name], Jackquline Denmark, MD   650 mg at 12/27/19 1735  . alum & mag hydroxide-simeth (MAALOX/MYLANTA) 200-200-20 MG/5ML suspension 30 mL  30 mL Oral Q4H PRN [name], Jackquline Denmark, MD   30 mL at 12/23/19 1955  . atorvastatin (LIPITOR) tablet 10 mg  10 mg Oral Daily [name], Jackquline Denmark, MD   10 mg at 12/29/19 0755  . diphenhydrAMINE (BENADRYL) capsule 50 mg  50 mg Oral Q6H PRN [name], [name], MD   50 mg at 12/29/19 0138   Or  . diphenhydrAMINE (BENADRYL) injection 50 mg  50 mg Intramuscular Q6H PRN [name], [name], MD   50 mg at 12/21/19 0244  . haloperidol (HALDOL) tablet 5 mg  5 mg Oral Q6H PRN [name], [name], MD   5 mg at 12/29/19 0138   Or  . haloperidol lactate (HALDOL) injection 5 mg  5 mg Intramuscular Q6H PRN [name], Jackquline Denmark, MD   5 mg at 12/21/19 0244  . lithium carbonate (ESKALITH) CR tablet 900 mg  900 mg Oral QHS [name], Jackquline Denmark, MD   900 mg at 12/28/19 2106  . LORazepam (ATIVAN) tablet 2 mg  2 mg Oral Q6H PRN [name], Jackquline Denmark, MD   2 mg at 12/29/19 8295   Or  . LORazepam (ATIVAN) injection 2 mg  2 mg Intramuscular Q6H PRN [name], Jackquline Denmark, MD   2 mg at 12/21/19 0243  . lurasidone (LATUDA) tablet 80 mg  80 mg Oral BID WC [name], [name], MD      . magnesium hydroxide (MILK OF MAGNESIA) suspension 30 mL  30 mL Oral Daily PRN [name], Jackquline Denmark, MD   30 mL at 12/25/19 0432  . menthol-cetylpyridinium (CEPACOL) lozenge 3 mg  1 lozenge Oral PRN [name], Jackquline Denmark, MD   3 mg at 12/25/19 0607  . temazepam (RESTORIL) capsule 15 mg  15 mg Oral QHS  PRN [name], Jackquline Denmark, MD       PTA Medications: Medications Prior to Admission  Medication Sig Dispense Refill Last Dose  . atorvastatin (LIPITOR) 10 MG tablet Take 10 mg by mouth daily.     [name] levothyroxine (SYNTHROID) 50 MCG tablet Take 50 mcg by mouth daily.     . medroxyPROGESTERone (DEPO-PROVERA) 150 MG/ML injection Inject 1 mL (150 mg total) into the muscle every 3 (three) months. 1 mL 0     Patient Stressors: Marital or family conflict Medication change or noncompliance  Patient Strengths: General fund of knowledge Motivation for treatment/growth Supportive family/friends  Treatment Modalities: Medication Management, Group therapy, Case management,  1 to 1 session with clinician, Psychoeducation, Recreational therapy.   Physician Treatment Plan for Primary Diagnosis: Bipolar I disorder with mania (HCC) Long Term Goal(s): Improvement in symptoms so as ready for discharge Improvement in symptoms so as ready for discharge   Short Term Goals: Ability to demonstrate self-control will improve Ability to identify and develop effective coping behaviors will improve Compliance with prescribed medications will improve  Medication Management: Evaluate patient's response, side effects, and tolerance of medication regimen.  Therapeutic Interventions: 1 to 1  sessions, Unit Group sessions and Medication administration.  Evaluation of Outcomes: Progressing  Physician Treatment Plan for Secondary Diagnosis: Principal Problem:   Bipolar I disorder with mania (HCC)  Long Term Goal(s): Improvement in symptoms so as ready for discharge Improvement in symptoms so as ready for discharge   Short Term Goals: Ability to demonstrate self-control will improve Ability to identify and develop effective coping behaviors will improve Compliance with prescribed medications will improve     Medication Management: Evaluate patient's response, side effects, and tolerance of medication  regimen.  Therapeutic Interventions: 1 to 1 sessions, Unit Group sessions and Medication administration.  Evaluation of Outcomes: Progressing   RN Treatment Plan for Primary Diagnosis: Bipolar I disorder with mania (HCC) Long Term Goal(s): Knowledge of disease and therapeutic regimen to maintain health will improve  Short Term Goals: Ability to verbalize frustration and anger appropriately will improve, Ability to demonstrate self-control, Ability to participate in decision making will improve, Ability to verbalize feelings will improve, Ability to identify and develop effective coping behaviors will improve and Compliance with prescribed medications will improve  Medication Management: RN will administer medications as ordered by provider, will assess and evaluate patient's response and provide education to patient for prescribed medication. RN will report any adverse and/or side effects to prescribing provider.  Therapeutic Interventions: 1 on 1 counseling sessions, Psychoeducation, Medication administration, Evaluate responses to treatment, Monitor vital signs and CBGs as ordered, Perform/monitor CIWA, COWS, AIMS and Fall Risk screenings as ordered, Perform wound care treatments as ordered.  Evaluation of Outcomes: Progressing   LCSW Treatment Plan for Primary Diagnosis: Bipolar I disorder with mania (HCC) Long Term Goal(s): Safe transition to appropriate next level of care at discharge, Engage patient in therapeutic group addressing interpersonal concerns.  Short Term Goals: Engage patient in aftercare planning with referrals and resources, Increase social support, Increase ability to appropriately verbalize feelings, Increase emotional regulation, Facilitate acceptance of mental health diagnosis and concerns and Increase skills for wellness and recovery  Therapeutic Interventions: Assess for all discharge needs, 1 to 1 time with Social worker, Explore available resources and support  systems, Assess for adequacy in community support network, Educate family and significant other(s) on suicide prevention, Complete Psychosocial Assessment, Interpersonal group therapy.  Evaluation of Outcomes: Progressing   Progress in Treatment: Attending groups: Yes. Participating in groups: Yes. Taking medication as prescribed: Yes. Toleration medication: Yes. Family/Significant other contact made: Yes, individual(s) contacted:  Sabino Snipes, Boyfriend  Patient understands diagnosis: Yes. Discussing patient identified problems/goals with staff: Yes. Medical problems stabilized or resolved: Yes. Denies suicidal/homicidal ideation: Yes. Issues/concerns per patient self-inventory: No. Other:  New problem(s) identified: No, Describe:  None   New Short Term/Long Term Goal(s): medication management for mood stabilization; elimination of SI thoughts; development of comprehensive mental wellness/sobriety plan.  Update 12/25/2019:  No changes at this time. Update 12/29/19- No changes. Patient is currently not sleeping  Patient Goals:  "to follow though with my medications"  Update 12/25/2019: No changes at this time. Update 12/29/19- No changes   Discharge Plan or Barriers: Patient reports plans to begin treatment and medications once discharged.  Update 12/25/2019: Patient reports plans to return to her home.  She will not be able to continue with her previous mental health providers, [facility], due to noncompliance with treatment recommendations.  Patient has identified that she would like to follow up with [facility] at discharge. Update 12/29/19- No changes   Reason for Continuation of Hospitalization: Anxiety Depression Medication stabilization Suicidal ideation  Estimated  Length of Stay: 1-7 Days   Attendees: Patient: 12/29/2019 11:56 AM  Physician: Dr. Toni Amend, MD 12/29/2019 11:56 AM  Nursing: Hayes Ludwig, RN, Jorene Minors, RN 12/29/2019 11:56 AM  RN Care Manager: 12/29/2019 11:56 AM  Social  Worker: Susa Simmonds, LCSWA 12/29/2019 11:56 AM  Recreational Therapist:  12/29/2019 11:56 AM  Other:  12/29/2019 11:56 AM  Other:  12/29/2019 11:56 AM  Other: 12/29/2019 11:56 AM    Scribe for Treatment Team: Susa Simmonds, LCSWA 12/29/2019 11:56 AM

## 2019-12-29 NOTE — Progress Notes (Signed)
Pt has been alert and oriented to person, place, but not to circumstances/situation. Pt wanders halls, slowly, staring inappropriately, mood labile, becomes easily irritable, then tearful, at times rambles nonsensically. Pt denies suicidal and homicidal ideation, denies hallucinations, denies feelings of depression and anxiety. Pt attempts to lay down to nap several times, as directed by staff, but will be back up and wandering the hallways, slowly pacing within minutes. Pt is medication compliant, denies pain, is focused on discharge. Pt has poor insight and judgement. Pt is unable to identify her reason for admission. Pt denies any reason for admission. Pt attended group. Will continue to monitor pt per Q15 minute face checks and monitor for safety and progress.
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
## 2019-12-29 NOTE — BHH Group Notes (Signed)
LCSW Group Therapy Note      12/29/2019:  1:18 PM- 2:12 PM                 Type of Therapy and Topic:  Group Therapy: Establishing Boundaries    Participation Level:  Minimal     Description of Group:    In this group, patients learned how to define boundaries, discussed the different types or boundaries with examples.  They identified times that boundaries had been violated and how they reacted.  They analyzed how their reaction was possibly beneficial and how it was possibly unhelpful.  The group discussed how to set boundaries, respect others boundaries and communicate their boundaries. The group utilized a role play scenarios (working with a partner) and discussed how each person in the scenario could have reacted differently and what boundaries they need to implement to improve their life. Patients also discussed consequences to overstepping boundaries and lack of boundaries. Patients discussed how to establish boundaries with clear consequences. Patients will explore discussion questions that address media influence and why it is hard to set boundaries.       Therapeutic Goals:   Patients will define boundaries and explore (physical, personal space and language boundaries).  Patients will remember their last incident where their boundaries were violated and how they behaved.  Patients will practice empathy and understanding of other's boundaries and learn from others in group.  Patients will explore how they may have crossed another person's boundaries in the past.   Patients will learn healthy ways to set and communicate boundaries.  Patients will actively engage in group activity utilizing role play and critical thinking skills.      Summary of Patient Progress:  Patient checked into group feeling fine. Patient was crying and upset because she was ready to be discharged and felt like she was going to be in the hospital forever. Patient spoke about not sleeping and saying in her  last placement they put her in solitary confinement. Patient wanted to sit next to another patient and got upset when the patient moved across the room. Patient began to cry and asked why the patient left her. Patient also accused another patient of calling her satan. This resulted into a disagreement back and forth. Patient got up from her chair and put her head in the trash. Patient stated she was going back to her room now.   Therapeutic Modalities:    Cognitive Behavioral Therapy      Susa Simmonds, Theresia Majors   12/29/2019

## 2019-12-30 LAB — LIPID PANEL
Cholesterol: 153 mg/dL (ref 0–200)
HDL: 51 mg/dL (ref >40)
LDL Cholesterol: 59 mg/dL (ref 0–99)
Total CHOL/HDL Ratio: 3 RATIO
Triglycerides: 213 mg/dL — ABNORMAL HIGH (ref <150)
VLDL: 43 mg/dL — ABNORMAL HIGH (ref 0–40)

## 2019-12-30 NOTE — Progress Notes (Signed)
Pt refused EKG.

## 2019-12-30 NOTE — Progress Notes (Signed)
Patient approached nurses station requesting throat lozenge. Lozenge provided to patient and patient returned to her room.   Cleo Butler-Nicholson, LPN

## 2019-12-30 NOTE — BHH Counselor (Signed)
CSW received a phone call at the nurses station from patients boyfriend, Sabino Snipes. Mr. Melvyn Neth asked if the weekend and weekday staff communicate because he thought patient was ready for discharge. Mr. Melvyn Neth asked CSW when patient was going to be discharged. CSW told [name] that she has no control over discharges and the doctor makes that determination based on the patients progress. Mr. Melvyn Neth stated this feels like a hostage situation and asked to speak with the doctor. CSW stated the doctor was not available at this time. Mr. Melvyn Neth stated he would call back later.

## 2019-12-30 NOTE — Plan of Care (Signed)
  Problem: Education: Goal: Knowledge of [facility] Education information/materials will improve Outcome: Not Progressing Goal: Emotional status will improve Outcome: Not Progressing Goal: Mental status will improve Outcome: Not Progressing Goal: Verbalization of understanding the information provided will improve Outcome: Not Progressing   Problem: Activity: Goal: Interest or engagement in activities will improve Outcome: Not Progressing Goal: Sleeping patterns will improve Outcome: Not Progressing   Problem: Coping: Goal: Ability to verbalize frustrations and anger appropriately will improve Outcome: Not Progressing Goal: Ability to demonstrate self-control will improve Outcome: Not Progressing   Problem: Health Behavior/Discharge Planning: Goal: Identification of resources available to assist in meeting health care needs will improve Outcome: Not Progressing Goal: Compliance with treatment plan for underlying cause of condition will improve Outcome: Not Progressing   Problem: Physical Regulation: Goal: Ability to maintain clinical measurements within normal limits will improve Outcome: Not Progressing   Problem: Safety: Goal: Periods of time without injury will increase Outcome: Not Progressing   Problem: Activity: Goal: Will verbalize the importance of balancing activity with adequate rest periods Outcome: Not Progressing   Problem: Education: Goal: Will be free of psychotic symptoms Outcome: Not Progressing Goal: Knowledge of the prescribed therapeutic regimen will improve Outcome: Not Progressing   Problem: Coping: Goal: Coping ability will improve Outcome: Not Progressing Goal: Will verbalize feelings Outcome: Not Progressing   Problem: Health Behavior/Discharge Planning: Goal: Compliance with prescribed medication regimen will improve Outcome: Not Progressing   Problem: Nutritional: Goal: Ability to achieve adequate nutritional intake will  improve Outcome: Not Progressing   Problem: Role Relationship: Goal: Ability to communicate needs accurately will improve Outcome: Not Progressing Goal: Ability to interact with others will improve Outcome: Not Progressing   Problem: Safety: Goal: Ability to redirect hostility and anger into socially appropriate behaviors will improve Outcome: Not Progressing Goal: Ability to remain free from injury will improve Outcome: Not Progressing   Problem: Self-Care: Goal: Ability to participate in self-care as condition permits will improve Outcome: Not Progressing   Problem: Self-Concept: Goal: Will verbalize positive feelings about self Outcome: Not Progressing   

## 2019-12-30 NOTE — BHH Group Notes (Signed)
Date/Time: 12/30/2019 1:11-2:05 PM   Type of Therapy and Topic: Group Therapy: Trust and Honesty    Participation Level: Minimal    Description of Group:  In this group patients will be asked to explore value of being honest. Patients will be guided to discuss their thoughts, feelings, and behaviors related to honesty and trusting in others. Patients will process together how trust and honesty relate to how we form relationships with peers, family members, and self. Each patient will be challenged to identify and express feelings of being vulnerable. Patients will discuss reasons why people are dishonest and identify alternative outcomes if one was truthful (to self or others). This group will be process-oriented, with patients participating in exploration of their own experiences as well as giving and receiving support and challenge from other group members.    Therapeutic Goals:  1. Patient will identify why honesty is important to relationships and how honesty overall affects relationships.  2. Patient will identify a situation where they lied or were lied too and the feelings, thought process, and behaviors surrounding the situation  3. Patient will identify the meaning of being vulnerable, how that feels, and how that correlates to being honest with self and others.  4. Patient will identify situations where they could have told the truth, but instead lied and explain reasons of dishonesty.      Summary of Patient Progress: Patient checked into group feeling better. Patient came into group and put her hand down on the table and closed her eyes. CSW had to say patients name several times due to it looking like she was sleeping. Patient was not positive during group and would talk about situations with her mother and boyfriend. Patient at one point referred to staff as idiots. Patient did not have too much input about telling the truth and being honest. Patient did speak about how she does not  trust her boyfriend.     Therapeutic Modalities:  Cognitive Behavioral Therapy  Solution Focused Therapy  Motivational Interviewing  Brief Therapy     Susa Simmonds, Theresia Majors   12/30/2019
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
## 2019-12-30 NOTE — Progress Notes (Signed)
Patient approached nurses station requesting Milk of Magnesia. Medication provided and tolerated without incident.   Krista Butler-Nicholson, LPN

## 2019-12-30 NOTE — Progress Notes (Signed)
Patient is alert and oriented x 4. She has been very active on the unit and at times very intrusive with other peers. She has exhibited attention seeking behaviors. She contributes her behavior to not being able to be there for her daughter's birthday. She denies SI/HI/AVH anxiety and pain. She states that her depression is 10/10 and at this point she wants to desperately leave the hospital.  She was med compliant and tolerated her medications without incident. She remains safe on the unit and was informed to contact staff with any concerns.  Cleo Butler-Nicholson, LPN

## 2019-12-30 NOTE — Plan of Care (Signed)
Patient intrusive and needy with staff   Problem: Education: Goal: Emotional status will improve Outcome: Not Progressing Goal: Mental status will improve Outcome: Not Progressing

## 2019-12-30 NOTE — Progress Notes (Signed)
Patient pleasant during assessment denying SI/HI/AVH. Patientpresents with less manic behavior and will continue to monitor to see if she sleeps any better tonight.Patient given education, support, and encouragement to be active in her treatment plan. Pt given support. Patient compliant with medication administration per MD orders. Patient being monitored Q 15 minutes for safety per unit protocol. Pt remains safe on the unit. 

## 2019-12-30 NOTE — Progress Notes (Addendum)
Pt is alert and oriented to person, place, time and situation. Pt denies suicidal and homicidal ideation, denies hallucinations, denies depression and anxiety, can be often intrusive and argumentative towards another female peer on the unit requires redirections away from that peer, pt is redirctable. Pt attended groups, writes in her composition book, filled out the daily inventory, flight of ideas noted in the answers pt provided. Thoughts and speech are still often disorganized. Pt's mood is a little less labile today, no tearfulness noted, pt has a blunted affect, and has been less intrusive at the nurses station. Pt's mood is elevated, pt noted to be pacing in the hallways singing loudly. Pt often noted to be slowing wandering and pacing around the hallways with few breaks. Pt's appetite is good. Pt is medication compliant, denies pain, overall is calmer today. No distress noted, none reported, no complaints voiced. Pt groomed self, and styled her hair. Will continue to monitor pt per Q15 minute face checks and monitor for safety and progress.

## 2019-12-31 NOTE — Progress Notes (Signed)
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

## 2019-12-31 NOTE — Progress Notes (Signed)
Patient pleasant during assessment denying SI/HI/AVH. Patientpresents with less manic behavior and will continue to monitor to see if she sleeps any better tonight.Patient given education, support, and encouragement to be active in her treatment plan. Pt given support. Patient compliant with medication administration per MD orders. Patient being monitored Q 15 minutes for safety per unit protocol. Pt remains safe on the unit. 

## 2019-12-31 NOTE — Plan of Care (Signed)
Patient needy and intrusive with staff  Problem: Education: Goal: Emotional status will improve Outcome: Not Progressing Goal: Mental status will improve Outcome: Not Progressing

## 2019-12-31 NOTE — Progress Notes (Signed)
Patient continues to come up to the nursing station stating, "I just need to laugh out loud.", and "there was a funny movie on [employer]." Patient redirected to her room. Patient given PRN medications and is still not sleeping. Patient is redirectable to her room.
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
## 2020-01-01 NOTE — [facility] Group Notes (Signed)
[facility] Group Notes:  (Nursing/MHT/Case Management/Adjunct)  Date:  01/01/2020  Time:  11:30 PM  Type of Therapy:  Group Therapy  Participation Level:  Active  Participation Quality:  Drowsy  Affect:  Appropriate  Cognitive:  Appropriate  Insight:  Good  Engagement in Group:  Engaged and goal was to call [facility].  Modes of Intervention:  Support  Summary of Progress/Problems:  Mayra Neer 01/01/2020, 11:30 PM

## 2020-01-01 NOTE — Plan of Care (Signed)
Patient is calm and more appropriate in the milieu today.Patient stated that her sleep is better for the last two days and she thinks she is ready for discharge.Denies SI,HI and AVH.Compliant with medications.Attended groups.Appetite good.Energy level fair.Support and encouragement given.

## 2020-01-01 NOTE — Progress Notes (Signed)
Patient pleasant during assessment denying SI/HI/AVH. Patientpresents with less manic behavior and will continue to monitor to see if she sleeps any better tonight.Patient given education, support, and encouragement to be active in her treatment plan. Pt given support. Patient compliant with medication administration per MD orders. Patient being monitored Q 15 minutes for safety per unit protocol. Pt remains safe on the unit.
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
## 2020-01-01 NOTE — Progress Notes (Signed)
Recreation Therapy Notes   Date: 01/01/2020  Time: 9:30 am  Location: Craft room   Behavioral response: Appropriate,Lethargic   Intervention Topic: Self-esteem  Discussion/Intervention:  Group content today was focused on self-esteem. Patient defined self-esteem and where it comes form. The group described reasons self-esteem is important. Individuals stated things that impact self-esteem and positive ways to improve self-esteem. The group participated in the intervention "Collage of Me" where patients were able to create a collage of positive things that makes them who they are. Clinical Observations/Feedback:  Patient came to group and was sleep half the group then woke up and participated in some of the intervention.   [name] LRT/CTRS         [name] 01/01/2020 1:12 PM

## 2020-01-01 NOTE — Progress Notes (Signed)
°   01/01/20 1455  Clinical Encounter Type  Visited With Patient  Visit Type Follow-up;Spiritual support;Social support;Behavioral Health  Referral From Chaplain  Consult/Referral To Chaplain  Visited with Pt in [place]. Pt told me that she was doing good and going home Thursday. PT said she is going to take her meds and do everything that she is suppose to do when she gets home. Ch will follow-up.

## 2020-01-02 NOTE — Progress Notes (Signed)
D: Pt alert and oriented x 4. Pt rates depression 0/10, hopelessness 0/10, and anxiety 0/10.Pt goal: "Get ready to discharge, see my family." Pt reports energy level as normal and concentration as being good. Pt reports sleep last night as being good. Pt did receive medications for sleep and did not find them helpful, pt states she still woke up. Pt denies experiencing any pain at this time. Pt denies experiencing any SI/HI, or AVH at this time.   A: Scheduled medications administered to pt, per MD orders. Support and encouragement provided. Frequent verbal contact made. Routine safety checks conducted q15 minutes.   R: No adverse drug reactions noted. Pt verbally contracts for safety at this time. Pt complaint with medications and treatment plan. Pt interacts well with others on the unit. Pt remains safe at this time. Will continue to monitor.  Pt appears to have improved in comparison to assessment upon admission. Pt has not been constantly at the nurse's station.
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
## 2020-01-02 NOTE — Progress Notes (Signed)
Patient intrusive, needy, and silly with staff. Patient has continued to come up to the nurses station since 0300.

## 2020-01-02 NOTE — Progress Notes (Signed)
Recreation Therapy Notes   Date: 01/01/2020  Time: 9:30 am  Location: Craft room   Behavioral response: Appropriate  Intervention Topic: Coping skills   Discussion/Intervention:  Group content on today was focused on coping skills. The group defined what coping skills are and when they normally use coping skills. Individuals described how they normally cope with thing and the coping skills they normally use. Patients expressed why it is important to cope with things and how not coping with things can affect you. The group participated in the intervention "My coping box" and made coping boxes while adding coping skills they could use in the future to the box. Clinical Observations/Feedback:  Patient came to group and defined coping skills as things in your toolbox that help you feel better. She identified her coping skills as singing, reading and watching television. Individual was social with peers and staff while participating in the intervention.  Ruhaan Nordahl LRT/CTRS         Anysha Frappier 01/02/2020 12:11 PM

## 2020-01-02 NOTE — Plan of Care (Signed)
  Problem: Communication Goal: STG - Patient will demonstrate improved communication skills by spontaneously contributing to 2 group discussions within 5 recreation therapy group sessions Description: STG - Patient will demonstrate improved communication skills by spontaneously contributing to 2 group discussions within 5 recreation therapy group sessions Outcome: Progressing   

## 2020-01-03 NOTE — Progress Notes (Signed)
Patient alert and oriented x 3 with periods of confusion to situation, she appears less anxious and less intrusive,she was calm in the milieu  interacting appropriately with peers and staff. Patient's thoughts are less disorganized, with flight of ideas noted, speech is tangential non pressured,  she denies SI/HI/AVH but appears responding to internal stimuli, she was complaint with medication regimen. Emotional support given, 15 minutes safety checks maintained will continue tomonitor

## 2020-01-03 NOTE — Progress Notes (Signed)
Recreation Therapy Notes  Date: 01/03/2020  Time: 10:30 am  Location: Craft room   Behavioral response: Appropriate,Lethargic   Intervention Topic: Leisure   Discussion/Intervention:  Group content today was focused on leisure. The group defined what leisure is and some positive leisure activities they participate in. Individuals identified the difference between good and bad leisure. Participants expressed how they feel after participating in the leisure of their choice. The group discussed how they go about picking a leisure activity and if others are involved in their leisure activities. The patient stated how many leisure activities they have to choose from and reasons why it is important to have leisure time. Individuals participated in the intervention "Exploration of Leisure" where they had a chance to identify new leisure activities as well as benefits of leisure. Clinical Observations/Feedback:  Patient came to group and defined leisure as what you love to do for fun. She expressed that her leisure activities are dancing and singing. Individual was social with peers and staff while participating in the intervention. Participant went to sleep a few times during group and woke up.  Jorgina Binning LRT/CTRS         Chevon Fomby 01/03/2020 1:50 PM
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
## 2020-01-03 NOTE — Progress Notes (Signed)
BRIEF PHARMACY NOTE   This patient attended and participated in Medication Management Group counseling led by [facility] staff pharmacist.  This interactive class reviews basic information about prescription medications and education on personal responsibility in medication management.  The class also includes general knowledge of 3 main classes of behavioral medications, including antipsychotics, antidepressants, and mood stabilizers.     Patient behavior was appropriate for group setting.   Educational materials sourced from:  "Medication Do's and Don'ts" from WWW.MED-PASS.COM   "Mental Health Medications" from National Institute of Mental Health Https://www.nimh.nih.gov/health/topics/mental-health-medications/index.shtml#part 149856     Alia Parsley M Amiley Shishido ,PharmD 01/03/2020 , 3:53 PM  

## 2020-01-03 NOTE — Progress Notes (Signed)
D: Pt alert and oriented x 4. Pt rates depression 0/10, hopelessness 0/10, and anxiety 0/10.Pt goal: "Create and execute discharge plan. Confirm outside support and appointments." Pt reports energy level as high and concentration as being good. Pt reports sleep last night as being good. Pt states she did not receive medications for sleep, however it was given to her anyway. Pt denies experiencing any pain at this time. Pt denies experiencing any SI/HI, or AVH at this time. Pt has improved behavior and is observed as being cooperative. Pt states she's sad because she's still here. Pt rarely comes up to the nurse's station in comparison to admission and beginnings of stay here.  A: Scheduled medications administered to pt, per MD orders. Support and encouragement provided. Frequent verbal contact made. Routine safety checks conducted q15 minutes.   R: No adverse drug reactions noted. Pt verbally contracts for safety at this time. Pt complaint with medications and treatment plan. Pt interacts well with others on the unit. Pt remains safe at this time. Will continue to monitor.

## 2020-01-03 NOTE — Tx Team (Signed)
Interdisciplinary Treatment and Diagnostic Plan Update  01/03/2020 Time of Session: 9:30 AM [name] MRN: [id number]  Principal Diagnosis: Bipolar I disorder with mania (HCC)  Secondary Diagnoses: Principal Problem:   Bipolar I disorder with mania (HCC)   Current Medications:  Current Facility-Administered Medications  Medication Dose Route Frequency Provider Last Rate Last Admin  . acetaminophen (TYLENOL) tablet 650 mg  650 mg Oral Q6H PRN [name], Jackquline Denmark, MD   650 mg at 12/27/19 1735  . alum & mag hydroxide-simeth (MAALOX/MYLANTA) 200-200-20 MG/5ML suspension 30 mL  30 mL Oral Q4H PRN [name], Jackquline Denmark, MD   30 mL at 12/23/19 1955  . atorvastatin (LIPITOR) tablet 10 mg  10 mg Oral Daily [name], Jackquline Denmark, MD   10 mg at 01/03/20 0736  . diphenhydrAMINE (BENADRYL) capsule 50 mg  50 mg Oral Q6H PRN [name], [name], MD   50 mg at 01/01/20 2222   Or  . diphenhydrAMINE (BENADRYL) injection 50 mg  50 mg Intramuscular Q6H PRN [name], [name], MD   50 mg at 12/21/19 0244  . haloperidol (HALDOL) tablet 5 mg  5 mg Oral Q6H PRN [name], [name], MD   5 mg at 12/29/19 0138   Or  . haloperidol lactate (HALDOL) injection 5 mg  5 mg Intramuscular Q6H PRN [name], Jackquline Denmark, MD   5 mg at 12/21/19 0244  . lithium carbonate (ESKALITH) CR tablet 900 mg  900 mg Oral QHS [name], Jackquline Denmark, MD   900 mg at 01/02/20 2110  . LORazepam (ATIVAN) tablet 2 mg  2 mg Oral Q6H PRN [name], Jackquline Denmark, MD   2 mg at 01/02/20 2110   Or  . LORazepam (ATIVAN) injection 2 mg  2 mg Intramuscular Q6H PRN [name], Jackquline Denmark, MD   2 mg at 12/21/19 0243  . lurasidone (LATUDA) tablet 80 mg  80 mg Oral BID WC [name], Jackquline Denmark, MD   80 mg at 01/03/20 0736  . magnesium hydroxide (MILK OF MAGNESIA) suspension 30 mL  30 mL Oral Daily PRN [name], Jackquline Denmark, MD   30 mL at 01/01/20 0859  . menthol-cetylpyridinium (CEPACOL) lozenge 3 mg  1 lozenge Oral PRN [name], Jackquline Denmark, MD   3 mg at 12/30/19 0129  . temazepam (RESTORIL) capsule 15 mg   15 mg Oral QHS PRN [name], Jackquline Denmark, MD   15 mg at 01/02/20 2110   PTA Medications: Medications Prior to Admission  Medication Sig Dispense Refill Last Dose  . atorvastatin (LIPITOR) 10 MG tablet Take 10 mg by mouth daily.     [name] levothyroxine (SYNTHROID) 50 MCG tablet Take 50 mcg by mouth daily.     . medroxyPROGESTERone (DEPO-PROVERA) 150 MG/ML injection Inject 1 mL (150 mg total) into the muscle every 3 (three) months. 1 mL 0     Patient Stressors: Marital or family conflict Medication change or noncompliance  Patient Strengths: General fund of knowledge Motivation for treatment/growth Supportive family/friends  Treatment Modalities: Medication Management, Group therapy, Case management,  1 to 1 session with clinician, Psychoeducation, Recreational therapy.   Physician Treatment Plan for Primary Diagnosis: Bipolar I disorder with mania (HCC) Long Term Goal(s): Improvement in symptoms so as ready for discharge Improvement in symptoms so as ready for discharge   Short Term Goals: Ability to demonstrate self-control will improve Ability to identify and develop effective coping behaviors will improve Compliance with prescribed medications will improve  Medication Management: Evaluate patient's response, side effects, and tolerance of medication regimen.  Therapeutic Interventions: 1 to 1 sessions, Unit Group sessions and Medication administration.  Evaluation of Outcomes: Progressing  Physician Treatment Plan for Secondary Diagnosis: Principal Problem:   Bipolar I disorder with mania (HCC)  Long Term Goal(s): Improvement in symptoms so as ready for discharge Improvement in symptoms so as ready for discharge   Short Term Goals: Ability to demonstrate self-control will improve Ability to identify and develop effective coping behaviors will improve Compliance with prescribed medications will improve     Medication Management: Evaluate patient's response, side effects, and  tolerance of medication regimen.  Therapeutic Interventions: 1 to 1 sessions, Unit Group sessions and Medication administration.  Evaluation of Outcomes: Progressing   RN Treatment Plan for Primary Diagnosis: Bipolar I disorder with mania (HCC) Long Term Goal(s): Knowledge of disease and therapeutic regimen to maintain health will improve  Short Term Goals: Ability to verbalize frustration and anger appropriately will improve, Ability to demonstrate self-control, Ability to participate in decision making will improve, Ability to verbalize feelings will improve, Ability to identify and develop effective coping behaviors will improve and Compliance with prescribed medications will improve  Medication Management: RN will administer medications as ordered by provider, will assess and evaluate patient's response and provide education to patient for prescribed medication. RN will report any adverse and/or side effects to prescribing provider.  Therapeutic Interventions: 1 on 1 counseling sessions, Psychoeducation, Medication administration, Evaluate responses to treatment, Monitor vital signs and CBGs as ordered, Perform/monitor CIWA, COWS, AIMS and Fall Risk screenings as ordered, Perform wound care treatments as ordered.  Evaluation of Outcomes: Progressing   LCSW Treatment Plan for Primary Diagnosis: Bipolar I disorder with mania (HCC) Long Term Goal(s): Safe transition to appropriate next level of care at discharge, Engage patient in therapeutic group addressing interpersonal concerns.  Short Term Goals: Engage patient in aftercare planning with referrals and resources, Increase social support, Increase ability to appropriately verbalize feelings, Increase emotional regulation, Facilitate acceptance of mental health diagnosis and concerns and Increase skills for wellness and recovery  Therapeutic Interventions: Assess for all discharge needs, 1 to 1 time with Social worker, Explore available  resources and support systems, Assess for adequacy in community support network, Educate family and significant other(s) on suicide prevention, Complete Psychosocial Assessment, Interpersonal group therapy.  Evaluation of Outcomes: Progressing   Progress in Treatment: Attending groups: Yes. Participating in groups: Yes. Taking medication as prescribed: Yes. Toleration medication: Yes. Family/Significant other contact made: Yes, individual(s) contacted:  Sabino Snipes, Boyfriend  Patient understands diagnosis: Yes. Discussing patient identified problems/goals with staff: Yes. Medical problems stabilized or resolved: Yes. Denies suicidal/homicidal ideation: Yes. Issues/concerns per patient self-inventory: No. Other:  New problem(s) identified: No, Describe:  None   New Short Term/Long Term Goal(s): medication management for mood stabilization; elimination of SI thoughts; development of comprehensive mental wellness/sobriety plan.  Update 12/25/2019:  No changes at this time. Update 12/29/19- No changes. Patient is currently not sleeping Update 01/03/2020:  No changes at this time  Patient Goals:  "to follow though with my medications"  Update 12/25/2019: No changes at this time. Update 12/29/19- No changes  Update 01/03/2020:  No changes at this time  Discharge Plan or Barriers: Patient reports plans to begin treatment and medications once discharged.  Update 12/25/2019: Patient reports plans to return to her home.  She will not be able to continue with her previous mental health providers, [facility], due to noncompliance with treatment recommendations.  Patient has identified that she would like to follow up with [facility]  at discharge. Update 12/29/19- No changes  Update 01/03/2020: Patient has improved with her disposition, though she struggles with intrusiveness at times. Patient reports plans to return to her home and to begin services with [facility].   Reason for Continuation of Hospitalization:  Anxiety Depression Medication stabilization Suicidal ideation  Estimated Length of Stay: 1-7 Days   Attendees: Patient: 01/03/2020 3:49 PM  Physician: Dr. Smith Robert, MD 01/03/2020 3:49 PM  Nursing: Jorene Minors, RN 01/03/2020 3:49 PM  RN Care Manager: 01/03/2020 3:49 PM  Social Worker: Penni Homans, LCSW 01/03/2020 3:49 PM  Recreational Therapist:  01/03/2020 3:49 PM  Other:  01/03/2020 3:49 PM  Other:  01/03/2020 3:49 PM  Other: 01/03/2020 3:49 PM    Scribe for Treatment Team: Harden Mo, LCSW 01/03/2020 3:49 PM

## 2020-01-03 NOTE — Progress Notes (Signed)
°   01/03/20 1430  Clinical Encounter Type  Visited With Patient  Visit Type Follow-up;Spiritual support;Social support;Behavioral Health  Referral From Nurse  Consult/Referral To Chaplain  visited with Pt as I was rounding unit in [facility] Pt was ready to go home. Pt asked me to talk to the Dr for her. I will follow-up with Pt.

## 2020-01-04 MED ORDER — LITHIUM CARBONATE ER 450 MG PO TBCR: 900.0000 mg | EXTENDED_RELEASE_TABLET | Freq: Every day | ORAL | 1 refills | Status: DC

## 2020-01-04 MED ORDER — LURASIDONE HCL 80 MG PO TABS: 80.0000 mg | ORAL_TABLET | Freq: Two times a day (BID) | ORAL | 1 refills | Status: DC

## 2020-01-04 NOTE — [facility] Suicide Risk Assessment (Signed)
[facility] Discharge Suicide Risk Assessment   Principal Problem: Bipolar I disorder with mania (HCC) Discharge Diagnoses: Principal Problem:   Bipolar I disorder with mania (HCC)   Total Time spent with patient: 15-20    Mental Status just done on the discharge summary same day and time.                            Mental Status Per Nursing Assessment::   On Admission:  NA  Demographic Factors:   Age,; life issues;  Stress at home   Loss Factors: Self esteem recovery   Historical Factors: Diff arguments and stressors at home, need for compliance with maintenance meds   Risk Reduction Factors:    Better community support for med compliance, therapy, group and personal women's issues family  And couples therapy     Continued Clinical Symptoms:  Generally stable contracts for safety   Cognitive Features That Contribute To Risk:   None other now     Suicide Risk:   Low at discharge    Follow-up Information    [facility] Follow up on 01/09/2020.   Why: You are scheduled for a hospital follow up on Wednesday, September 8th at 12:30pm. This is an in person appointment. Please bring any medications you are currently taking and you discharge paperwork with you. Contact information: [address] [place] [address] [phone]               Plan Of Care/Follow-up recommendations:    Day treatment, IOP , outpatient therapy, med mgt, women's groups CODA and NAMI meetings      Roselind Messier, MD 01/04/2020, 10:20 AM

## 2020-01-04 NOTE — Plan of Care (Signed)
  Problem: Communication Goal: STG - Patient will demonstrate improved communication skills by spontaneously contributing to 2 group discussions within 5 recreation therapy group sessions Description: STG - Patient will demonstrate improved communication skills by spontaneously contributing to 2 group discussions within 5 recreation therapy group sessions Outcome: Completed/Met

## 2020-01-04 NOTE — Progress Notes (Signed)
Recreation Therapy Notes  INPATIENT RECREATION TR PLAN  Patient Details Name: [name] MRN: [id number] DOB: 1971-02-01 Today's Date: 01/04/2020  Rec Therapy Plan Is patient appropriate for Therapeutic Recreation?: Yes Treatment times per week: At least 3 Estimated Length of Stay: 5-7 days TR Treatment/Interventions: Group participation (Comment)  Discharge Criteria Pt will be discharged from therapy if:: Discharged Treatment plan/goals/alternatives discussed and agreed upon by:: Patient/family  Discharge Summary Short term goals set: Patient will demonstrate improved communication skills by spontaneously contributing to 2 group discussions within 5 recreation therapy group sessions Short term goals met: Complete Progress toward goals comments: Groups attended Which groups?: Leisure education, Coping skills, Communication, Self-esteem, Goal setting, Other (Comment) (Creative expressions, Problem Solving, Relaxation) Reason goals not met: N/A Therapeutic equipment acquired: N/A Reason patient discharged from therapy: Discharge from hospital Pt/family agrees with progress & goals achieved: Yes Date patient discharged from therapy: 01/04/20   Theador Jezewski 01/04/2020, 2:57 PM

## 2020-01-04 NOTE — Progress Notes (Signed)
  [facility] Adult Case Management Discharge Plan :  Will you be returning to the same living situation after discharge:  Yes,  pt reports that she is returning home. At discharge, do you have transportation home?: Yes,  pt reports that her boyfriend will provide transportation. Do you have the ability to pay for your medications: Yes,  BCBS  Release of information consent forms completed and in the chart;  Patient's signature needed at discharge.  Patient to Follow up at:  Follow-up Information    [facility] Follow up on 01/09/2020.   Why: You are scheduled for a hospital follow up on Wednesday, September 8th at 12:30pm. This is an in person appointment. Please bring any medications you are currently taking and you discharge paperwork with you. Contact information: [address] [place] [address] [phone]               Next level of care provider has access to [facility] Link:no  Safety Planning and Suicide Prevention discussed: Yes,  SPE completed with the patient's boyfriend.  Have you used any form of tobacco in the last 30 days? (Cigarettes, Smokeless Tobacco, Cigars, and/or Pipes): No  Has patient been referred to the Quitline?: Patient refused referral  Patient has been referred for addiction treatment: Pt. refused referral  Harden Mo, LCSW 01/04/2020, 10:33 AM
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
## 2020-01-04 NOTE — Progress Notes (Signed)
Recreation Therapy Notes  Date: 01/04/2020  Time: 9:30 am  Location: Craft room   Behavioral response: Appropriate  Intervention Topic: Communication   Discussion/Intervention:  Group content today was focused on communication. The group defined communication and ways to communicate with others. Individuals stated reason why communication is important and some reasons to communicate with others. Patients expressed if they thought they were good at communicating with others and ways they could improve their communication skills. The group identified important parts of communication and some experiences they have had in the past with communication. The group participated in the intervention "What is that?", where they had a chance to test out their communication skills and identify ways to improve their communication techniques.   Clinical Observations/Feedback:  Patient came to group and defined communication as messages being sent and received. Individual was social with peers and staff while participating in the intervention.  [name] LRT/CTRS         Janee Ureste 01/04/2020 1:48 PM

## 2020-01-04 NOTE — Progress Notes (Signed)
Patient alert and oriented x 4, she appears less anxious and less intrusive,she was calm in the milieu,  interacting appropriately with peers and staff. Patient's thoughts are organized,  speech is tangential non pressured, she denies SI/HI/AVH but appears responding to internal stimuli, she was complaint with medication regimen. Emotional support given, 15 minutes safety checks maintained will continue tomonitor

## 2020-01-04 NOTE — Progress Notes (Signed)
Discharge Note:   Pt discharged with [name] as her ride per arrangement by SW. Pt is alert and oriented to person, place, time and situation. Pt is calm, cooperative, pleasant, given discharge instructions, which include her follow up appointments and discharge med prescriptions and medication education; pt verbalized understanding of all. No distress noted, none reported, pt voices no complaints. Pt denies suicidal and homicidal ideation, denies hallucinations, denies feelings of depression and anxiety. Pt was given a ride via safe transport after verification that her house keys are with her belongings. All personal belongings returned to pt upon discharge.

## 2021-05-16 ENCOUNTER — Ambulatory Visit (HOSPITAL_COMMUNITY)
Admission: RE | Admit: 2021-05-16 | Discharge: 2021-05-16 | Disposition: A | Discharge status: Home / Self Care | Payer: BC Managed Care – PPO | Attending: Psychiatry | Admitting: Psychiatry

## 2021-05-16 DIAGNOSIS — F319 Bipolar disorder, unspecified: Secondary | ICD-10-CM | POA: Diagnosis present

## 2021-05-16 DIAGNOSIS — F3110 Bipolar disorder, current episode manic without psychotic features, unspecified: Secondary | ICD-10-CM | POA: Diagnosis not present

## 2021-05-16 MED ORDER — TRAZODONE HCL 50 MG PO TABS
50.0000 mg | ORAL_TABLET | Freq: Every evening | ORAL | Status: DC | PRN
  Filled 2021-05-16: qty 1

## 2021-05-16 MED ORDER — TRAZODONE HCL 50 MG PO TABS
50.0000 mg | ORAL_TABLET | Freq: Every evening | ORAL | 0 refills | Status: DC | PRN
Start: 2021-05-16 — End: 2021-06-15

## 2021-05-16 NOTE — Discharge Instructions (Signed)
Take all medications as prescribed. Keep all follow-up appointments as scheduled.  Do not consume alcohol or use illegal drugs while on prescription medications. Report any adverse effects from your medications to your primary care provider promptly.  In the event of recurrent symptoms or worsening symptoms, call 911, a crisis hotline, or go to the nearest emergency department for evaluation.   

## 2021-05-16 NOTE — H&P (Signed)
Behavioral Health Medical Screening Exam  [name] is an 51 y.o. female.  Presents to [employer] [facility] accompanied by yoga instructor.  Reports patient did not appear to be her self she presented with high energy.  She reports a history of bipolar 1 disorder.  States she has not been taking her medications as indicated.  States 2 days ago she was recently restarted on lithium 450 mg daily which she reports taking and tolerating well.  She reports poor sleep and racing thoughts. "  I know what will be a few days before my medication is in the system."  She denied suicidal or homicidal ideations.  Denies auditory visual hallucinations.  Present slightly pressured, coherent and goal-directed.   During evaluation [name] is sitting  in no acute distress. She is alert/oriented x 4; calm/cooperative; and mood congruent with affect. She is speaking in a clear tone at moderate volume, and normal pace; with good eye contact. Her thought process is coherent and relevant; There is no indication that she is currently responding to internal/external stimuli or experiencing delusional thought content; and she has denied suicidal/self-harm/homicidal ideation, psychosis, and paranoia.   Patient has remained calm throughout assessment and has answered questions appropriately.     At this time [name] is educated and verbalizes understanding of mental health resources and other crisis services in the community. She is instructed to call 911 and present to the nearest emergency room should she experience any suicidal/homicidal ideation, auditory/visual/hallucinations, or detrimental worsening of her mental health condition. She was a also advised by Probation officer that she could call the toll-free phone on insurance card to assist with identifying in network counselors and agencies or number on back of Medicaid card to speak with care coordinator.   Total  Time spent with patient: 15 minutes  Psychiatric Specialty Exam: Physical Exam Vitals and nursing note reviewed.  Neurological:     Mental Status: She is alert and oriented to person, place, and time.  Psychiatric:        Mood and Affect: Mood normal.        Thought Content: Thought content normal.   Review of Systems  HENT: Negative.    Cardiovascular: Negative.   Musculoskeletal: Negative.   Psychiatric/Behavioral:  Negative for agitation and suicidal ideas. The patient is hyperactive.   All other systems reviewed and are negative. Blood pressure 130/75, pulse 91, temperature 98.8 F (37.1 C), temperature source Oral, resp. rate 18, SpO2 100 %.There is no height or weight on file to calculate BMI. General Appearance: Casual Eye Contact:  Good Speech:  Clear and Coherent and Pressured Volume:  Normal Mood:  Anxious Affect:  Congruent Thought Process:  Coherent Orientation:  Full (Time, Place, and Person) Thought Content:  Logical Suicidal Thoughts:  No Homicidal Thoughts:  No Memory:  Immediate;   Good Recent;   Good Judgement:  Good Insight:  Good Psychomotor Activity:  Normal Concentration: Concentration: Good Recall:  Good Fund of Knowledge:Good Language: Fair Akathisia:  No Handed:  Right AIMS (if indicated):    Assets:  Desire for Improvement Resilience Social Support Sleep:     Musculoskeletal: Strength & Muscle Tone: within normal limits Gait & Station: normal Patient leans: N/A  Blood pressure 130/75, pulse 91, temperature 98.8 F (37.1 C), temperature source Oral, resp. rate 18, SpO2 100 %.  Recommendations: Based on my evaluation the patient appears to have an emergency medical condition for which I recommend the patient be transferred to the  emergency department for further evaluation.  Derrill [place], NP 05/16/2021, 2:41 PM
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
## 2021-05-16 NOTE — H&P (Signed)
Behavioral Health Medical Screening Exam  [name] is an 51 y.o. female with psychiatric history of bipolar disorder.  Patient presented voluntarily to [facility] for mental health evaluation. Patient is accompanied by her boyfriend [name].  Patient was evaluated here at [facility] earlier today 05/16/2021 and discharged with plans to follow up with outpatient provider.  Patient states "my boyfriend coerced me into coming here because I am interrupting his sleep. I have a lot of things on my mind that I want to express or journal but he brought me here instead because I'm keeping him up." Patient reports that she is diagnosed with bipolar 1 disorder and has been off of her Lithium for "a while." She reports she resumed taking Lithium Carbonate 900/day on 05/07/2021 after an upsetting incident with a colleague at work. She reports that she has been tolerating Lithium well since resuming. She states "It will take sometime before the Lithium get in my system." Patient denies suicidal ideation, homicidal ideation, and psychosis. She endorses poor sleep and reports using 1 CBD gummy per day for sleep. She endorses consuming alcoholic beverages "once per week  and socially."   During assessment, patient is alert, hyper verbal,  and oriented x4. She is no apparent distress; she is calm and cooperative. Patient's speech is slightly pressured but clear and coherent. She maintained good eye contact. She reports her mood as angry and upset due to returning to behavioral health for another assessment. Patient's thought process is coherent and relevant.  Objectively there is no indication the patient is responding to internal/external stimuli or experiencing any delusional thought content.  She contracted for safety.    This Probation officer discussed treatment options with patient. Discussed inpatient admission for stabilization. Patient decline inpatient admission and reports  that she will follow up with outpatient provider. Patient does not meet involuntary commitment criteria at this time as she is alert and oriented, coherent, and poses no danger to this self or others.  Safety plan reviewed with patient and her significant other. patient was instructed to return to [facility], nearest ED, or contact 911 if she experiences worsening of symptoms, suicidal ideation, homicidal ideation, or psychosis.    Total Time spent with patient: 20 minutes  Psychiatric Specialty Exam: Physical Exam Vitals reviewed.  Constitutional:      General: She is not in acute distress.    Appearance: Normal appearance. She is not ill-appearing or toxic-appearing.  HENT:     Head: Normocephalic and atraumatic.  Eyes:     General:        Right eye: No discharge.        Left eye: No discharge.  Cardiovascular:     Rate and Rhythm: Normal rate.     Pulses: Normal pulses.  Pulmonary:     Effort: Pulmonary effort is normal. No respiratory distress.     Breath sounds: No wheezing.  Musculoskeletal:        General: Normal range of motion.     Cervical back: Normal range of motion.  Skin:    Coloration: Skin is not jaundiced or pale.     Findings: No bruising.  Neurological:     Mental Status: She is alert and oriented to person, place, and time.  Psychiatric:        Attention and Perception: Attention and perception normal. She does not perceive auditory or visual hallucinations.        Mood and Affect: Mood normal.  Speech: Speech normal.        Behavior: Behavior normal. Behavior is cooperative.        Thought Content: Thought content normal.        Cognition and Memory: Cognition normal.   Review of Systems  Constitutional:  Negative for activity change, appetite change and fever.  HENT: Negative.    Eyes:  Negative for pain and visual disturbance.  Respiratory: Negative.  Negative for shortness of breath.   Cardiovascular: Negative.  Negative for chest pain and  palpitations.  Gastrointestinal:  Negative for abdominal pain and vomiting.  Endocrine: Negative.   Genitourinary:  Negative for dysuria and hematuria.  Musculoskeletal:  Negative for arthralgias and back pain.  Skin:  Negative for color change and rash.  Neurological: Negative.   Hematological: Negative.   Psychiatric/Behavioral: Negative.    All other systems reviewed and are negative. There were no vitals taken for this visit.There is no height or weight on file to calculate BMI. General Appearance: Well Groomed Eye Contact:  Good Speech:  Clear and Coherent Volume:  Normal Mood:  Euthymic Affect:  Congruent Thought Process:  Coherent Orientation:  Full (Time, Place, and Person) Thought Content:  Logical Suicidal Thoughts:  No Homicidal Thoughts:  No Memory:  Immediate;   Good Recent;   Good Remote;   Good Judgement:  Fair Insight:  Good Psychomotor Activity:  Normal Concentration: Concentration: Fair and Attention Span: Fair Recall:  Good Fund of Knowledge:Good Language: Good Akathisia:  No Handed:  Right AIMS (if indicated):    Assets:  Communication Skills Desire for Improvement Financial Resources/Insurance Housing Physical Health Social Support Sleep:     Musculoskeletal: Strength & Muscle Tone: within normal limits Gait & Station: normal Patient leans: Right  There were no vitals taken for this visit.  Recommendations: Based on my evaluation the patient does not appear to have an emergency medical condition. This Probation officer discussed treatment options with patient. Discussed inpatient admission for stabilization. Patient decline inpatient admission and reports that she will follow up with outpatient provider. Patient does not meet involuntary commitment criteria at this time as she is alert and oriented, coherent, and poses no danger to this self or others.  Safety plan reviewed with patient and her significant other. patient was instructed to return to [facility], nearest ED, or contact 911 if she experiences worsening of symptoms, suicidal ideation, homicidal ideation, or psychosis.   Ophelia Shoulder, NP 05/16/2021, 11:52 PM

## 2021-05-16 NOTE — BH Assessment (Signed)
Patient is a 51 year old female that presents this date voluntary to [facility] as a walk in displaying manic symptoms with pressured speech. Patient denies any S/I, H/I or AVH. Patient has been off her medications for 5 months and recently started them back 2 days ago after a incident with a colleague at work. 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 Patient is brought in by her "[employer]" instructor Nestor Lewandowsky after patient started displaying manic symptoms in class. Patient is contracting for safety and was seen by Melvyn Neth NP who evaluated patient for medication interventions and reported patient did not meet criteria for a inpatient admission. Patient will follow up with her current provider which she has an appointment with in the coming week.

## 2021-05-18 ENCOUNTER — Encounter (HOSPITAL_COMMUNITY): Payer: Self-pay | Admitting: Registered Nurse

## 2021-05-18 ENCOUNTER — Ambulatory Visit (HOSPITAL_COMMUNITY)
Admission: EM | Admit: 2021-05-18 | Discharge: 2021-05-20 | Disposition: A | Discharge status: Other Acute Inpatient Hospital | Payer: BC Managed Care – PPO | Attending: Psychiatry | Admitting: Psychiatry

## 2021-05-18 ENCOUNTER — Other Ambulatory Visit: Payer: Self-pay

## 2021-05-18 ENCOUNTER — Ambulatory Visit (HOSPITAL_COMMUNITY)
Admission: AD | Admit: 2021-05-18 | Discharge: 2021-05-18 | Disposition: A | Discharge status: Home / Self Care | Payer: BC Managed Care – PPO

## 2021-05-18 DIAGNOSIS — Z87891 Personal history of nicotine dependence: Secondary | ICD-10-CM | POA: Insufficient documentation

## 2021-05-18 DIAGNOSIS — F3110 Bipolar disorder, current episode manic without psychotic features, unspecified: Secondary | ICD-10-CM | POA: Diagnosis not present

## 2021-05-18 DIAGNOSIS — Z20822 Contact with and (suspected) exposure to covid-19: Secondary | ICD-10-CM | POA: Diagnosis not present

## 2021-05-18 LAB — POCT URINE DRUG SCREEN - MANUAL ENTRY (I-SCREEN)
POC Amphetamine UR: NOT DETECTED
POC Buprenorphine (BUP): NOT DETECTED
POC Cocaine UR: NOT DETECTED
POC Marijuana UR: POSITIVE — AB
POC Methadone UR: NOT DETECTED
POC Methamphetamine UR: NOT DETECTED
POC Morphine: NOT DETECTED
POC Oxazepam (BZO): NOT DETECTED
POC Oxycodone UR: NOT DETECTED
POC Secobarbital (BAR): NOT DETECTED

## 2021-05-18 LAB — TSH: TSH: 2.452 u[IU]/mL (ref 0.350–4.500)

## 2021-05-18 LAB — COMPREHENSIVE METABOLIC PANEL
ALT: 18 U/L (ref 0–44)
AST: 19 U/L (ref 15–41)
Albumin: 4.3 g/dL (ref 3.5–5.0)
Alkaline Phosphatase: 57 U/L (ref 38–126)
Anion gap: 13 (ref 5–15)
BUN: 26 mg/dL — ABNORMAL HIGH (ref 6–20)
CO2: 24 mmol/L (ref 22–32)
Calcium: 10.3 mg/dL (ref 8.9–10.3)
Chloride: 100 mmol/L (ref 98–111)
Creatinine, Ser: 0.85 mg/dL (ref 0.44–1.00)
GFR, Estimated: >60 mL/min (ref >60)
Glucose, Bld: 89 mg/dL (ref 70–99)
Potassium: 4.3 mmol/L (ref 3.5–5.1)
Sodium: 137 mmol/L (ref 135–145)
Total Bilirubin: 0.7 mg/dL (ref 0.3–1.2)
Total Protein: 8 g/dL (ref 6.5–8.1)

## 2021-05-18 LAB — LIPID PANEL
Cholesterol: 334 mg/dL — ABNORMAL HIGH (ref 0–200)
HDL: 60 mg/dL (ref >40)
LDL Cholesterol: 258 mg/dL — ABNORMAL HIGH (ref 0–99)
Total CHOL/HDL Ratio: 5.6 RATIO
Triglycerides: 82 mg/dL (ref <150)
VLDL: 16 mg/dL (ref 0–40)

## 2021-05-18 LAB — URINALYSIS, ROUTINE W REFLEX MICROSCOPIC
Glucose, UA: NEGATIVE mg/dL
Hgb urine dipstick: NEGATIVE
Ketones, ur: >80 mg/dL — AB
Leukocytes,Ua: NEGATIVE
Nitrite: NEGATIVE
Protein, ur: NEGATIVE mg/dL
Specific Gravity, Urine: >1.03 — ABNORMAL HIGH (ref 1.005–1.030)
pH: 6 (ref 5.0–8.0)

## 2021-05-18 LAB — RESP PANEL BY RT-PCR (FLU A&B, COVID) ARPGX2
Influenza A by PCR: NEGATIVE
Influenza B by PCR: NEGATIVE
SARS Coronavirus 2 by RT PCR: NEGATIVE

## 2021-05-18 LAB — POC SARS CORONAVIRUS 2 AG: SARSCOV2ONAVIRUS 2 AG: NEGATIVE

## 2021-05-18 LAB — PREGNANCY, URINE: Preg Test, Ur: NEGATIVE

## 2021-05-18 LAB — LITHIUM LEVEL: Lithium Lvl: 0.6 mmol/L (ref 0.60–1.20)

## 2021-05-18 LAB — POCT PREGNANCY, URINE: Preg Test, Ur: NEGATIVE

## 2021-05-18 LAB — ETHANOL: Alcohol, Ethyl (B): <10 mg/dL (ref <10)

## 2021-05-18 LAB — MAGNESIUM: Magnesium: 1.9 mg/dL (ref 1.7–2.4)

## 2021-05-18 MED ORDER — LEVOTHYROXINE SODIUM 25 MCG PO TABS
50.0000 ug | ORAL_TABLET | Freq: Every day | ORAL | Status: DC
  Administered 2021-05-19 – 2021-05-20 (×2): 50 ug via ORAL
  Filled 2021-05-18 (×2): qty 2

## 2021-05-18 MED ORDER — ALUM & MAG HYDROXIDE-SIMETH 200-200-20 MG/5ML PO SUSP: 30.0000 mL | ORAL | Status: DC | PRN

## 2021-05-18 MED ORDER — MAGNESIUM HYDROXIDE 400 MG/5ML PO SUSP: 30.0000 mL | Freq: Every day | ORAL | Status: DC | PRN

## 2021-05-18 MED ORDER — QUETIAPINE FUMARATE ER 50 MG PO TB24
50.0000 mg | ORAL_TABLET | Freq: Every day | ORAL | Status: DC
  Administered 2021-05-18: 50 mg via ORAL
  Filled 2021-05-18: qty 1

## 2021-05-18 MED ORDER — ACETAMINOPHEN 325 MG PO TABS: 650.0000 mg | ORAL_TABLET | Freq: Four times a day (QID) | ORAL | Status: DC | PRN

## 2021-05-18 MED ORDER — TRAZODONE HCL 50 MG PO TABS
50.0000 mg | ORAL_TABLET | Freq: Every evening | ORAL | Status: DC | PRN
  Administered 2021-05-19: 50 mg via ORAL
  Filled 2021-05-18: qty 1

## 2021-05-18 MED ORDER — HYDROXYZINE HCL 25 MG PO TABS
25.0000 mg | ORAL_TABLET | Freq: Three times a day (TID) | ORAL | Status: DC | PRN
  Administered 2021-05-18 – 2021-05-19 (×3): 25 mg via ORAL
  Filled 2021-05-18 (×3): qty 1

## 2021-05-18 MED ORDER — LORAZEPAM 1 MG PO TABS
2.0000 mg | ORAL_TABLET | Freq: Once | ORAL | Status: AC
  Administered 2021-05-18: 2 mg via ORAL
  Filled 2021-05-18: qty 2

## 2021-05-18 MED ORDER — QUETIAPINE FUMARATE 25 MG PO TABS
25.0000 mg | ORAL_TABLET | Freq: Two times a day (BID) | ORAL | Status: DC
  Administered 2021-05-19: 25 mg via ORAL
  Filled 2021-05-18: qty 1

## 2021-05-18 MED ORDER — LITHIUM CARBONATE ER 450 MG PO TBCR
900.0000 mg | EXTENDED_RELEASE_TABLET | Freq: Every day | ORAL | Status: DC
  Administered 2021-05-18 – 2021-05-19 (×2): 900 mg via ORAL
  Filled 2021-05-18 (×2): qty 2

## 2021-05-18 NOTE — Progress Notes (Signed)
Patient talking rapidly, pacing unit.  Standing at another patient's bedside whispering to her.  Redirectable, but stated she wanted to "comfort her".  Patient stated that she's God, but she can't tell me the plan.  Tangential.  Patient took Vistaril.  Shuvon, NP on unit and out to talk with patient. Per [employer], order received for Ativan 2 mg PO.  Randall Hiss, RN present.
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
## 2021-05-18 NOTE — ED Notes (Signed)
LAB CALLED TO REPORT CBC & AIC SPECIMEN CLOTTED.

## 2021-05-18 NOTE — BH Assessment (Addendum)
Patient is a 51 year old female who presents to [facility] voluntary accompanied by her significant other, Veda Canning [phone] who provides collateral information. This is the fifth time patient has presented in the last two days.To note patient was seen and assessed having a full CCA completed earlier this date at 75. Please refer to that assessment for additional clinical details. Partner who is present reports that patient has been "waking him up every two hours" for the last three days and he feels her medications are not working as indicated. See MAR. Partner reports that patient is confrontational and argumentative with him and is easily provoked. Patient is denying any S/I, H/I or AVH and states she has been taking her medications as directed. Patient is observed to be liable and interrupts partner often as he attempts to render history. Patient acknowledges she is having a hypomanic episode although stats her partner "is just a mean person" and "doesn't care for her." Patient is observed to be hyperverbal with pressured speech and is difficult to redirect. Patient was seen and assessed by [place]in NP who recommended patient be admitted to continuous observation for further monitoring. A full CCA was completed earlier this date please refer to that documentation for additional information.

## 2021-05-18 NOTE — ED Notes (Signed)
Pt sleeping at present, no distress noted.  Monitoring for safety. 
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
## 2021-05-18 NOTE — ED Notes (Signed)
Pt A&O x 4, no distress noted, received report from [employer], pt manic, Ativan 2 mg's given po by Levon Hedger.  Pt stating she cannot see, contacts offered, pt refused stating she needs her glasses.  Monitoring for safety.

## 2021-05-18 NOTE — H&P (Signed)
Behavioral Health Medical Screening Exam  [name] is a 51 y.o. single female with a history of Bipolar Disorder presenting voluntarily to [facility] with her boyfriend [name] [phone] with whom she lives, for a psychiatric evaluation. 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 Patient reports that she was at home asleep tonight and was awakened by [name] who told her they were going to get something to eat and she was not aware that they were coming to [employer] [facility] [employer]. Patient reports that she has been taking her lithium 900 mg and trazodone as prescribed since January 5th and feels that her medications are helping her. Patient is receiving medication management from [facility].   Patient presented on 05/16/20 with a similar complaint and did not meet criteria at that time and was advised to follow up with outpatient providers.   Patient was alert oriented x4, logical, goal directed, cooperative and mostly calm but was tearful when discussing her relationship with her boyfriend. Patient is very talkative during the evaluation but her speech was not pressured or rapid. Patient reports that she and her boyfriend have been together for 12 years and she wants to get married, but he tells her he is not ready for marriage. Patient denies any SI/HI or AVH at this time and patient does not appear to be responding to any internal or external stimuli. Patient does not meet criteria for admission and will follow up with therapist on 05/18/20 [name] at [facility] for her scheduled appointment. Patient is in agreement with the plan.  Total Time spent with patient: 45 minutes  Psychiatric Specialty Exam:  Presentation  General Appearance: Casual  Eye Contact:Good  Speech:Clear and Coherent  Speech Volume:Normal  Handedness:Right   Mood and Affect   Mood:Euthymic  Affect:Congruent   Thought Process  Thought Processes:Coherent  Descriptions of Associations:Intact  Orientation:Full (Time, Place and Person)  Thought Content:Logical  History of Schizophrenia/Schizoaffective disorder:No  Duration of Psychotic Symptoms:No data recorded Hallucinations:Hallucinations: None  Ideas of Reference:None  Suicidal Thoughts:Suicidal Thoughts: No  Homicidal Thoughts:Homicidal Thoughts: No   Sensorium  Memory:Immediate Good; Recent Good; Remote Good  Judgment:Fair  Insight:Fair   Executive Functions  Concentration:Good  Attention Span:Good  [place] of Knowledge:Good  Language:Good   Psychomotor Activity  Psychomotor Activity:Psychomotor Activity: Normal   Assets  Assets:Communication Skills; Desire for Improvement; Transportation; Vocational/Educational; Housing; Physical Health; Resilience; Social Support   Sleep  Sleep:Sleep: Fair Number of Hours of Sleep: -1    Physical Exam: Physical Exam HENT:     Head: Normocephalic and atraumatic.     Nose: Nose normal.     Mouth/Throat:     Mouth: Mucous membranes are moist.  Eyes:     Pupils: Pupils are equal, round, and reactive to light.  Cardiovascular:     Rate and Rhythm: Normal rate.  Pulmonary:     Effort: Pulmonary effort is normal.  Abdominal:     General: Abdomen is flat.  Musculoskeletal:        General: Normal range of motion.     Cervical back: Normal range of motion.  Skin:    General: Skin is warm.  Neurological:     Mental Status: She is alert and oriented to person, place, and time.  Psychiatric:        Attention and Perception: Attention normal.        Mood and Affect:  Mood normal.        Speech: Speech normal.        Behavior: Behavior is cooperative.        Thought Content: Thought content normal.        Cognition and Memory: Cognition normal.        Judgment: Judgment normal.   Review of Systems  Constitutional:  Negative.   HENT: Negative.    Eyes: Negative.   Respiratory: Negative.    Cardiovascular: Negative.   Gastrointestinal: Negative.   Genitourinary: Negative.   Musculoskeletal: Negative.   Skin: Negative.   Neurological: Negative.   Endo/Heme/Allergies: Negative.   Psychiatric/Behavioral: Negative.    There were no vitals taken for this visit. There is no height or weight on file to calculate BMI.  Musculoskeletal: Strength & Muscle Tone: within normal limits Gait & Station: normal Patient leans: N/A   Recommendations:  Based on my evaluation the patient does not appear to have an emergency medical condition. Patient does not meet criteria for inpatient treatment and will follow up with her therapist [name] for her scheduled appointment on 05/18/2021 at 9 am.   Lucia Bitter, NP 05/18/2021, 5:37 AM

## 2021-05-18 NOTE — ED Notes (Signed)
Patient has had to constantly redirected. Staff told patient the rules of the unit and that she needed to stay in the flex area. Patient then told Latricia, RN that she was speaking to her with an attitude. Latricia was in fact not speaking to her with attitude, just letting her know the rules and that she could not sit in the adult area at this time. Patient was also offered her contacts but declined to have those as well. Patient now standing at flex nurses station window with head down continuing to give staff orders.

## 2021-05-19 DIAGNOSIS — F3110 Bipolar disorder, current episode manic without psychotic features, unspecified: Secondary | ICD-10-CM | POA: Diagnosis not present

## 2021-05-19 MED ORDER — OLANZAPINE 10 MG PO TBDP
10.0000 mg | ORAL_TABLET | Freq: Every day | ORAL | Status: DC
  Administered 2021-05-19 – 2021-05-20 (×2): 10 mg via ORAL
  Filled 2021-05-19 (×2): qty 1

## 2021-05-19 MED ORDER — DIPHENHYDRAMINE HCL 50 MG/ML IJ SOLN
50.0000 mg | Freq: Once | INTRAMUSCULAR | Status: AC
  Administered 2021-05-19: 50 mg via INTRAMUSCULAR
  Filled 2021-05-19: qty 1

## 2021-05-19 MED ORDER — LORAZEPAM 2 MG/ML IJ SOLN
2.0000 mg | Freq: Once | INTRAMUSCULAR | Status: AC
Start: 2021-05-19 — End: 2021-05-19
  Administered 2021-05-19: 2 mg via INTRAMUSCULAR
  Filled 2021-05-19: qty 1

## 2021-05-19 MED ORDER — LORAZEPAM 1 MG PO TABS
1.0000 mg | ORAL_TABLET | Freq: Once | ORAL | Status: AC
  Administered 2021-05-19: 1 mg via ORAL
  Filled 2021-05-19: qty 1

## 2021-05-19 MED ORDER — HALOPERIDOL LACTATE 5 MG/ML IJ SOLN
5.0000 mg | Freq: Once | INTRAMUSCULAR | Status: AC
  Administered 2021-05-19: 5 mg via INTRAMUSCULAR
  Filled 2021-05-19: qty 1

## 2021-05-19 NOTE — ED Notes (Signed)
Pt resting comfortably in no acute distress. RR even and unlabored. Safety maintained.

## 2021-05-19 NOTE — Progress Notes (Addendum)
Pt accepted to [facility] 05/20/21 after 9:00am to [employer] [facility].   CSW received a phone call from [facility] at 4:42pm. Per chart review information was already added to chart per first shift.  Pt has been accepted to [facility] tomorrow after 9am. Accepting physician is Dr. Forrestine Him. Phone number for report is [phone]     Maryjean Ka, MSW, [facility] 05/19/2021 7:44 PM

## 2021-05-19 NOTE — ED Notes (Signed)
Patient given cheese for breakfast. Patient on a low carb-keto diet.

## 2021-05-19 NOTE — Progress Notes (Signed)
Patient has been denied by [facility] due to no appropriate beds available. Patient meets BH inpatient criteria per Vernard Gambles, NP. Patient has been faxed out to the following facilities:   [facility]  [address], [place] [address] [phone]  [facility]  [address], [place] [address] [phone]  [facility]  [address] [place] [address] [phone]  [facility]  [address] [place] [address] [phone]  [facility]  [address], [place] [address] [phone]  [facility]  [address] [place], [place] [address] [phone]  [facility]  [address] [place], [place] [address] [phone]  [facility]  [address] [place] [address] [phone]  [facility]  [address] [place] [address] [phone]  [facility]  [address], [place] [address] [phone]  [facility]  [address] [place] [address] [phone]   Damita Dunnings, MSW, LCSW-A  1:02 PM 05/19/2021

## 2021-05-19 NOTE — ED Notes (Signed)
Pt sleeping at present, no distress noted.  Monitoring for safety. 

## 2021-05-19 NOTE — ED Notes (Signed)
Pt is  IVC, rambling at present, no distress noted.  MOnitoring for safety, cooperative.

## 2021-05-19 NOTE — ED Notes (Signed)
Pt hyper-religious stating, "I am the teacher for the spirit and you bitches better listen to me". Pt was able to be redirected without difficulty. Safety maintained.

## 2021-05-19 NOTE — ED Notes (Signed)
Writer called [employer] non-emergent to serve IVC papers to pt
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
## 2021-05-19 NOTE — ED Notes (Signed)
Pt states, "I am a child protector and I have a lot of [address] and [place] watching over me. 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 If you look into my eyes, you will see Jesus. Black people don't have a soul. 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 Pt rambles on in speech. Difficult to be reoriented at present. Informed pt to notify staff with any needs. Will continue to monitor for safety.

## 2021-05-19 NOTE — ED Notes (Signed)
Patient awake.. saying " I wish I could die", asking if she is pregnant, saying she had multiple abortions, I'm catholic etc. Patient also continuously coughing while speaking.

## 2021-05-19 NOTE — ED Notes (Signed)
Pt sitting in chair reading a book quietly. No acute distress noted. Informed pt to notify staff with any needs. Safety maintained.

## 2021-05-19 NOTE — ED Notes (Addendum)
Pt standing to nurses station noted to have rambling speech. Able to be redirected due to intrusive behaviors noted with other peer on unit (trying to sleep). Pt denies SI/HI/AVH. Difficulty staying focused on topic at hand. Loud in tone at times, singing loudly but redirectable. Pt given coloring pages as distraction technique. Pt requesting to leave facility. Pt states, "I can't sleep in here. I want to go home". Pt continuously making random statements. Will continue to monitor for safety.

## 2021-05-19 NOTE — ED Notes (Addendum)
Pt pacing unit quietly. Requesting to leave facility. Informed pt that a provider will speak with pt shortly. Verbalized understanding. Will continue to monitor for safety.

## 2021-05-19 NOTE — ED Notes (Signed)
Pt received am medication without difficulty. Singing on unit. No acute distress noted. Safety maintained.

## 2021-05-19 NOTE — ED Notes (Signed)
Pt standing to nurses station interacting with staff. Pt states, "I have all of these voices in my head and I try to drown them out by singing. [facility]) is talking to me and wants me to sing this song". Pt sang Greatest Love of All. Pt states singing makes her feel better. Safety maintained.

## 2021-05-19 NOTE — ED Notes (Signed)
Pt awake with rambling speech noted.  NP Lindon Romp notified.

## 2021-05-19 NOTE — ED Notes (Signed)
Pt noted to have increased anxiety, hyper-religious and disorganized. Vistaril 25 mg administered. Pt took medication without difficulty. Safety maintained.

## 2021-05-19 NOTE — ED Notes (Signed)
Patient is calm. Patient easily redirected. Patient is safe on unit with continued monitoring aeb MHT report.

## 2021-05-19 NOTE — ED Notes (Signed)
Patient being disruptive on unit aeb singing out loud. Patient request to use bathroom. Patient was directed by MHT. Patient become aggravated stating that MHT wasplaying tricks on her because she thought doors were locked. Patient spoke harsely to MHT.  MHT redirected client to return to flex unit. Patient continued to talk loud and aggressive to MHT. Patient was given meal. Patient is paranoid regarding ingredients aeb sorting and closely observing her food. Patient remains safe on unit with continued monitoring in flex unit.

## 2021-05-19 NOTE — ED Notes (Signed)
Pt is walking around  talking out loud to herself and crying and bloats out inappropriate language.

## 2021-05-19 NOTE — Progress Notes (Signed)
[facility] LCSW Progress Note   05/19/2021    2:55 PM  [place]   [id number]   Type of Contact and Topic:  Psychiatric Bed Placement   Pt accepted to [facility]   Patient meets inpatient criteria per Earleen Newport, NP  The attending provider will be Dr. Reece Levy    Call report to [phone]  Marinda Elk, RN @ [facility] notified.     Pt scheduled  to arrive at [facility] tomorrow after 0900. Please send IVC paperwork with patient during transport.    Mariea Clonts, MSW, LCSW-A  2:58 PM 05/19/2021

## 2021-05-19 NOTE — ED Notes (Signed)
Pt interacting with another peer in no acute distress.  Safety maintained.

## 2021-05-20 MED ORDER — OLANZAPINE 10 MG PO TBDP: 10.0000 mg | ORAL_TABLET | Freq: Every day | ORAL | Status: DC

## 2021-05-20 NOTE — ED Notes (Signed)
Pt sleeping at present, no distress noted.  Monitoring for safety. 

## 2021-05-20 NOTE — ED Notes (Signed)
Report given to [place], RN @[facility] 

## 2021-05-20 NOTE — ED Notes (Signed)
Offered Breakfast Patient refused it.
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
## 2021-05-20 NOTE — ED Notes (Signed)
[facility] REQUESTED TO [facility] AFTER 9AM THIS MORNING.  PT IS IVC.

## 2021-05-20 NOTE — Discharge Instructions (Addendum)
Transfer to [facility] °

## 2021-05-20 NOTE — ED Notes (Signed)
Pt laying on bed resting with eyes closed. RR even and unlabored. Safety maintained.

## 2021-05-20 NOTE — ED Notes (Signed)
Pt pacing unit quietly. Intrusive behaviors toward other peers but able to be redirected. Denies SI/HI/AVH. Informed pt that she will be transferred to another facility [facility]) this morning. Pt request to leave facility stating, "I don't belong here. My boyfriend has a bad spirit and people don't understand. You can't understand because you are black. 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 Remain disorganized but redirectable. Loud in tone at times. Preoccupied with religion, as pt talk frequently about spirits, God and witchery. Pt refer to staff as peons. Safety maintained. Will continue to monitor.

## 2021-05-20 NOTE — ED Notes (Signed)
Pt increasingly more agitated, cursing at staff members, labile, non redirectable.  NP Lindon Romp notified.

## 2021-05-20 NOTE — ED Notes (Signed)
Patient transferred to [facility] in no acute distress. Patient denied SI/HI, A/VH upon discharge. Patient verbalized understanding of need for transfer explained by staff. Pt remained disorganized but able to be reoriented. Pt belongings given to Ecologist #[id number] intact. Patient escorted to [facility] via staff for transport to destination. Safety maintained.

## 2021-05-28 ENCOUNTER — Telehealth: Payer: Self-pay | Admitting: Family Medicine

## 2021-05-28 NOTE — Telephone Encounter (Addendum)
[name] last seen by Dr. Diona Browner 11/06/2019.  Only person on her DPR is a friend [name].  I don't think we are able to disclose behavioral health information.

## 2021-05-28 NOTE — Telephone Encounter (Signed)
Dr. Ermalene Searing,   We are not allowed to give anyone information, even if on DPR, regarding the ER or behavioral health information.   I am sorry, we technically, aren't even supposed to access it unless we require it to treat or care directly for the patient.   Even individuals listed on the DPR may not have clearance to be able to discuss what is going on with patient.  Usually in patient [facility] sets limitations separate from a DPR on who can contact or communicate with patient or [facility] team.   Patient's family should contact behavioral health or hospital facility directly for information but it is up to the patient and behavioral health what can be shared with them.

## 2021-05-28 NOTE — Telephone Encounter (Signed)
Please let mother know about below  info per [facility].

## 2021-05-28 NOTE — Telephone Encounter (Signed)
Angelica Chessman can you help with this... what can I disclose about behavoiral health issues? Why can mom  or who is on DPR not contact BH?

## 2021-05-28 NOTE — Telephone Encounter (Signed)
Donnalee Curry (mother) notified as instructed by telephone.

## 2021-05-28 NOTE — Telephone Encounter (Signed)
Pt mother called stating that pt was committed to behavior Health a week ago. Pt mother is asking if you can give who is ever on the DPR a call to let them know what's going on. Please advise.

## 2021-05-29 ENCOUNTER — Telehealth (HOSPITAL_COMMUNITY): Payer: Self-pay | Admitting: Family Medicine

## 2021-05-29 NOTE — BH Assessment (Signed)
Care Management - [facility] Follow Up Discharges   Writer attempted to make contact with patient today and was unsuccessful.  Writer left a HIPPA compliant voice message.   Per chart review, patient will follow up with her established provider, for medication management at [facility].

## 2021-06-08 ENCOUNTER — Emergency Department (EMERGENCY_DEPARTMENT_HOSPITAL)
Admission: EM | Admit: 2021-06-08 | Discharge: 2021-06-08 | Disposition: A | Discharge status: Other Acute Inpatient Hospital | Payer: BC Managed Care – PPO | Source: Home / Self Care | Attending: Emergency Medicine | Admitting: Emergency Medicine

## 2021-06-08 ENCOUNTER — Ambulatory Visit (HOSPITAL_COMMUNITY)
Admission: EM | Admit: 2021-06-08 | Discharge: 2021-06-08 | Disposition: A | Discharge status: Home / Self Care | Payer: BC Managed Care – PPO

## 2021-06-08 ENCOUNTER — Encounter: Payer: Self-pay | Admitting: Psychiatry

## 2021-06-08 ENCOUNTER — Other Ambulatory Visit: Payer: Self-pay

## 2021-06-08 ENCOUNTER — Emergency Department: Payer: BC Managed Care – PPO

## 2021-06-08 ENCOUNTER — Inpatient Hospital Stay
Admission: AD | Admit: 2021-06-08 | Discharge: 2021-06-15 | DRG: 885 | Disposition: A | Discharge status: Home / Self Care | Payer: BC Managed Care – PPO | Source: Intra-hospital | Attending: Psychiatry | Admitting: Psychiatry

## 2021-06-08 DIAGNOSIS — Z20822 Contact with and (suspected) exposure to covid-19: Secondary | ICD-10-CM | POA: Insufficient documentation

## 2021-06-08 DIAGNOSIS — F3160 Bipolar disorder, current episode mixed, unspecified: Secondary | ICD-10-CM | POA: Diagnosis present

## 2021-06-08 DIAGNOSIS — Z79890 Hormone replacement therapy (postmenopausal): Secondary | ICD-10-CM | POA: Diagnosis not present

## 2021-06-08 DIAGNOSIS — R600 Localized edema: Secondary | ICD-10-CM | POA: Diagnosis present

## 2021-06-08 DIAGNOSIS — E039 Hypothyroidism, unspecified: Secondary | ICD-10-CM | POA: Diagnosis present

## 2021-06-08 DIAGNOSIS — Z87891 Personal history of nicotine dependence: Secondary | ICD-10-CM | POA: Diagnosis not present

## 2021-06-08 DIAGNOSIS — F29 Unspecified psychosis not due to a substance or known physiological condition: Secondary | ICD-10-CM | POA: Diagnosis present

## 2021-06-08 DIAGNOSIS — F319 Bipolar disorder, unspecified: Secondary | ICD-10-CM | POA: Diagnosis not present

## 2021-06-08 DIAGNOSIS — W228XXA Striking against or struck by other objects, initial encounter: Secondary | ICD-10-CM | POA: Insufficient documentation

## 2021-06-08 DIAGNOSIS — T148XXA Other injury of unspecified body region, initial encounter: Secondary | ICD-10-CM

## 2021-06-08 DIAGNOSIS — Z8639 Personal history of other endocrine, nutritional and metabolic disease: Secondary | ICD-10-CM

## 2021-06-08 DIAGNOSIS — F3164 Bipolar disorder, current episode mixed, severe, with psychotic features: Principal | ICD-10-CM | POA: Diagnosis present

## 2021-06-08 DIAGNOSIS — Z818 Family history of other mental and behavioral disorders: Secondary | ICD-10-CM | POA: Diagnosis not present

## 2021-06-08 DIAGNOSIS — Z79899 Other long term (current) drug therapy: Secondary | ICD-10-CM | POA: Insufficient documentation

## 2021-06-08 DIAGNOSIS — E785 Hyperlipidemia, unspecified: Secondary | ICD-10-CM | POA: Diagnosis present

## 2021-06-08 DIAGNOSIS — Z8249 Family history of ischemic heart disease and other diseases of the circulatory system: Secondary | ICD-10-CM | POA: Diagnosis not present

## 2021-06-08 DIAGNOSIS — S0990XA Unspecified injury of head, initial encounter: Secondary | ICD-10-CM

## 2021-06-08 DIAGNOSIS — R4586 Emotional lability: Secondary | ICD-10-CM | POA: Diagnosis present

## 2021-06-08 DIAGNOSIS — R45851 Suicidal ideations: Secondary | ICD-10-CM | POA: Diagnosis present

## 2021-06-08 DIAGNOSIS — M25473 Effusion, unspecified ankle: Secondary | ICD-10-CM

## 2021-06-08 DIAGNOSIS — Z820 Family history of epilepsy and other diseases of the nervous system: Secondary | ICD-10-CM

## 2021-06-08 DIAGNOSIS — S0083XA Contusion of other part of head, initial encounter: Secondary | ICD-10-CM | POA: Insufficient documentation

## 2021-06-08 DIAGNOSIS — R4689 Other symptoms and signs involving appearance and behavior: Secondary | ICD-10-CM

## 2021-06-08 LAB — CBC
HCT: 38.3 % (ref 36.0–46.0)
Hemoglobin: 12.3 g/dL (ref 12.0–15.0)
MCH: 29.6 pg (ref 26.0–34.0)
MCHC: 32.1 g/dL (ref 30.0–36.0)
MCV: 92.3 fL (ref 80.0–100.0)
Platelets: 350 10*3/uL (ref 150–400)
RBC: 4.15 MIL/uL (ref 3.87–5.11)
RDW: 12.1 % (ref 11.5–15.5)
WBC: 10.1 10*3/uL (ref 4.0–10.5)
nRBC: 0 % (ref 0.0–0.2)

## 2021-06-08 LAB — COMPREHENSIVE METABOLIC PANEL
ALT: 29 U/L (ref 0–44)
AST: 27 U/L (ref 15–41)
Albumin: 3.2 g/dL — ABNORMAL LOW (ref 3.5–5.0)
Alkaline Phosphatase: 75 U/L (ref 38–126)
Anion gap: 7 (ref 5–15)
BUN: 16 mg/dL (ref 6–20)
CO2: 27 mmol/L (ref 22–32)
Calcium: 9.2 mg/dL (ref 8.9–10.3)
Chloride: 103 mmol/L (ref 98–111)
Creatinine, Ser: 0.44 mg/dL (ref 0.44–1.00)
GFR, Estimated: >60 mL/min (ref >60)
Glucose, Bld: 110 mg/dL — ABNORMAL HIGH (ref 70–99)
Potassium: 4.1 mmol/L (ref 3.5–5.1)
Sodium: 137 mmol/L (ref 135–145)
Total Bilirubin: 0.4 mg/dL (ref 0.3–1.2)
Total Protein: 6.7 g/dL (ref 6.5–8.1)

## 2021-06-08 LAB — URINE DRUG SCREEN, QUALITATIVE (ARMC ONLY)
Amphetamines, Ur Screen: NOT DETECTED
Barbiturates, Ur Screen: NOT DETECTED
Benzodiazepine, Ur Scrn: POSITIVE — AB
Cannabinoid 50 Ng, Ur ~~LOC~~: NOT DETECTED
Cocaine Metabolite,Ur ~~LOC~~: NOT DETECTED
MDMA (Ecstasy)Ur Screen: NOT DETECTED
Methadone Scn, Ur: NOT DETECTED
Opiate, Ur Screen: NOT DETECTED
Phencyclidine (PCP) Ur S: NOT DETECTED
Tricyclic, Ur Screen: POSITIVE — AB

## 2021-06-08 LAB — RESP PANEL BY RT-PCR (FLU A&B, COVID) ARPGX2
Influenza A by PCR: NEGATIVE
Influenza B by PCR: NEGATIVE
SARS Coronavirus 2 by RT PCR: NEGATIVE

## 2021-06-08 LAB — POC URINE PREG, ED: Preg Test, Ur: NEGATIVE

## 2021-06-08 LAB — SALICYLATE LEVEL: Salicylate Lvl: <7 mg/dL — ABNORMAL LOW (ref 7.0–30.0)

## 2021-06-08 LAB — ETHANOL: Alcohol, Ethyl (B): <10 mg/dL (ref <10)

## 2021-06-08 LAB — TSH: TSH: 2.017 u[IU]/mL (ref 0.350–4.500)

## 2021-06-08 LAB — ACETAMINOPHEN LEVEL: Acetaminophen (Tylenol), Serum: <10 ug/mL — ABNORMAL LOW (ref 10–30)

## 2021-06-08 LAB — CK: Total CK: 108 U/L (ref 38–234)

## 2021-06-08 LAB — LITHIUM LEVEL: Lithium Lvl: 0.37 mmol/L — ABNORMAL LOW (ref 0.60–1.20)

## 2021-06-08 MED ORDER — LITHIUM CARBONATE ER 450 MG PO TBCR
900.0000 mg | EXTENDED_RELEASE_TABLET | Freq: Every day | ORAL | Status: DC
  Filled 2021-06-08: qty 2

## 2021-06-08 MED ORDER — LITHIUM CARBONATE ER 450 MG PO TBCR
900.0000 mg | EXTENDED_RELEASE_TABLET | Freq: Every day | ORAL | Status: DC
  Administered 2021-06-08 – 2021-06-14 (×7): 900 mg via ORAL
  Filled 2021-06-08 (×7): qty 2

## 2021-06-08 MED ORDER — LORAZEPAM 1 MG PO TABS
1.0000 mg | ORAL_TABLET | Freq: Two times a day (BID) | ORAL | Status: DC
  Administered 2021-06-08 – 2021-06-09 (×2): 1 mg via ORAL
  Filled 2021-06-08 (×2): qty 1

## 2021-06-08 MED ORDER — ACETAMINOPHEN 500 MG PO TABS
1000.0000 mg | ORAL_TABLET | Freq: Once | ORAL | Status: DC
  Filled 2021-06-08: qty 2

## 2021-06-08 MED ORDER — ACETAMINOPHEN 325 MG PO TABS: 650.0000 mg | ORAL_TABLET | Freq: Four times a day (QID) | ORAL | Status: DC | PRN

## 2021-06-08 MED ORDER — ALUM & MAG HYDROXIDE-SIMETH 200-200-20 MG/5ML PO SUSP: 30.0000 mL | ORAL | Status: DC | PRN

## 2021-06-08 MED ORDER — QUETIAPINE FUMARATE 200 MG PO TABS: 400.0000 mg | ORAL_TABLET | Freq: Every day | ORAL | Status: DC

## 2021-06-08 MED ORDER — QUETIAPINE FUMARATE 200 MG PO TABS
400.0000 mg | ORAL_TABLET | Freq: Every day | ORAL | Status: DC
  Administered 2021-06-08: 400 mg via ORAL
  Filled 2021-06-08: qty 2

## 2021-06-08 MED ORDER — MAGNESIUM HYDROXIDE 400 MG/5ML PO SUSP
30.0000 mL | Freq: Every day | ORAL | Status: DC | PRN
  Administered 2021-06-11 – 2021-06-13 (×2): 30 mL via ORAL
  Filled 2021-06-08 (×2): qty 30

## 2021-06-08 MED ORDER — LORAZEPAM 1 MG PO TABS
1.0000 mg | ORAL_TABLET | Freq: Two times a day (BID) | ORAL | Status: DC
  Filled 2021-06-08: qty 1

## 2021-06-08 MED ORDER — HYDROXYZINE HCL 25 MG PO TABS
25.0000 mg | ORAL_TABLET | Freq: Four times a day (QID) | ORAL | Status: DC | PRN
  Administered 2021-06-10 – 2021-06-13 (×5): 25 mg via ORAL
  Filled 2021-06-08 (×7): qty 1

## 2021-06-08 MED ORDER — HYDROXYZINE PAMOATE 25 MG PO CAPS
25.0000 mg | ORAL_CAPSULE | Freq: Four times a day (QID) | ORAL | Status: DC | PRN
  Filled 2021-06-08: qty 1

## 2021-06-08 NOTE — ED Notes (Signed)
Pt returned from CT °

## 2021-06-08 NOTE — BH Assessment (Signed)
Patient is under review with [facility]. Awaiting accepting information.  
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
## 2021-06-08 NOTE — Discharge Instructions (Addendum)
Take all medications as prescribed. Keep all follow-up appointments as scheduled.  Do not consume alcohol or use illegal drugs while on prescription medications. Report any adverse effects from your medications to your primary care provider promptly.  In the event of recurrent symptoms or worsening symptoms, call 911, a crisis hotline, or go to the nearest emergency department for evaluation.   
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
## 2021-06-08 NOTE — ED Notes (Addendum)
Patient's boyfriend [name]  [name] [phone] (Daughter) [name] [phone] (son) - Mother [phone]

## 2021-06-08 NOTE — ED Notes (Addendum)
Patient dressed out at this time with this RN and [place] EDT. Patient belongings include-  - Navy blue hoodie - Cell phone (given to boyfriend) - Advice worker - Purple underwear - Black socks - Black slides - Glasses (given to boyfriend) - TEFL teacher - Black watch

## 2021-06-08 NOTE — ED Provider Notes (Addendum)
Behavioral Health Urgent Care Medical Screening Exam  [name] Name: [name] MRN: [id number] Date of Evaluation: 06/08/21 Chief Complaint:   Diagnosis:  Final diagnoses:  Bipolar I disorder ([place])    History of Present illness: [name] is a 51 y.o. female present to [facility] urgent care accompanied by her significant other.  Was reported [name] was recently discharged from inpatient admission at [facility]. [name] reported that [name] has been complain of headache and  " bleeding from her head."  [name] reports she had requested to follow-up at the local emergency department.  [name] significant [name]) reports " I cannot leave her home alone likely she needs to be monitored."  Reports [name] has a follow-up appointment on Thursday with her psychiatrist. [name] is denying suicidal or homicidal ideations.  Denies auditory or  visual hallucinations. She is awake, alert and oriented x 3.   [name] has a charted history of bipolar 1 disorder, major depression disorder and generalized anxiety disorder. Reported that her medications was adjusted while she was inpatient and it was reported that she is taking and tolerating medications well.  [name] seen face to face by this provider, consulted with Dr. Richardson Landry; and chart reviewed on 06/08/21.  On evaluation [name] reports   During evaluation [name] is sitting no acute distress. Present with a flat but pleasant affect. She present calm/cooperative; and mood congruent with affect.  She is speaking in a clear tone at moderate volume. She is slightly argumentative with her boyfriend due to not being transported to the local emergency department for her headache.   [name] made minimal eye contact.although her  thought process is coherent and relevant; There is no indication that she is  currently responding to internal/external stimuli or experiencing delusional thought content; and she has denied suicidal/self-harm/homicidal ideation, psychosis, and paranoia.  [name] has remained calm throughout assessment and has answered questions appropriately.     At this time [name] is educated and verbalizes understanding of mental health resources and other crisis services in the community.She is instructed to call 911 and present to the nearest emergency room should she experience any suicidal/homicidal ideation, auditory/visual/hallucinations, or detrimental worsening of her mental health condition. She was a also advised by Probation officer that she could call the toll-free phone on insurance card to assist with identifying in network counselors and agencies or number on back of Medicaid card to speak with care coordinator.     Psychiatric Specialty Exam  Presentation  General Appearance:Appropriate for Environment; Casual  Eye Contact:Fair  Speech:Normal Rate  Speech Volume:Normal  Handedness:Right   Mood and Affect  Mood:Labile; Irritable  Affect:Labile   Thought Process  Thought Processes:Disorganized  Descriptions of Associations:Tangential  Orientation:Full (Time, Place and Person)  Thought Content:Scattered; Tangential; Delusions  Diagnosis of Schizophrenia or Schizoaffective disorder in past: No   Hallucinations:None  Ideas of Reference:Delusions; Paranoia  Suicidal Thoughts:No  Homicidal Thoughts:No   Sensorium  Memory:Immediate Fair; Recent Fair; Remote Fair  Judgment:Impaired  Insight:Lacking; Poor   Executive Functions  Concentration:Poor  Attention Span:Fair  [place]   Psychomotor Activity  Psychomotor Activity:Restlessness   Assets  Assets:Communication Skills; Desire for Improvement   Sleep  Sleep:Poor  Number of hours: -1   No data recorded  Physical  Exam: Physical Exam Vitals reviewed.  Cardiovascular:     Rate and Rhythm: Normal rate and regular rhythm.  Neurological:  Mental Status: She is alert and oriented to person, place, and time.  Psychiatric:        Mood and Affect: Mood normal.        Behavior: Behavior normal.   Review of Systems  Genitourinary: Negative.   Musculoskeletal: Negative.   Neurological:  Positive for dizziness.  Psychiatric/Behavioral:  Negative for depression and suicidal ideas. The [name] is nervous/anxious.   All other systems reviewed and are negative. Blood pressure 120/81, pulse (!) 102, temperature 97.7 F (36.5 C), temperature source Oral, resp. rate 18, height 5\' 1"  (1.549 m), weight 145 lb (65.8 kg), SpO2 100 %. Body mass index is 27.4 kg/m.  Musculoskeletal: Strength & Muscle Tone: within normal limits Gait & Station: normal [name] leans: N/A   [place] MSE Discharge Disposition for Follow up and Recommendations: Based on my evaluation the [name] does not appear to have an emergency medical condition and can be discharged with resources and follow up care in outpatient services for Individual Therapy   Krista [place], NP 06/08/2021, 10:57 AM

## 2021-06-08 NOTE — ED Notes (Signed)
Patient unable to provide urine sample at this time

## 2021-06-08 NOTE — ED Notes (Signed)
IVC, pt to be admitted to [facility] BMU room 310 after 7:30 pm tonight

## 2021-06-08 NOTE — ED Notes (Signed)
PATIENT PROVIDED WITH DINNER TRAY, PATIENT COOPERATIVE AND CALM
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
## 2021-06-08 NOTE — BH Assessment (Signed)
Patient is to be admitted to [facility] BMU tonight 06/08/21 after 7:30pm by Dr. Toni Amend.  Attending Physician will be Dr.  Toni Amend .   Patient has been assigned to room 310, by [facility] Charge Nurse, Maryelizabeth Kaufmann.     ER staff is aware of the admission: Nitchia, ER Secretary   Dr. Fuller Plan, ER MD  Sue Lush, Patient's Nurse  Sue Lush, Patient Access.

## 2021-06-08 NOTE — ED Notes (Signed)
EDP at bedside  

## 2021-06-08 NOTE — BH Assessment (Signed)
Pt presenting to [facility] with her boyfriend. Pt reports she has a concussion and reports falling last week. Pt reports having a headache.  Boyfriend (Bf) reports pt was discharged from [facility] on Friday and since dc she has not been sleeping well until last night when she got about 4 1/2 of sleep. BF reports that pt woke up this morning thinking that her head was bleeding and yelling at him. Bf reports he is scared to leave pt by herself due to history of her running outside naked. Pt denies SI, HI, AVH. Pt requesting to go to ED for a CT scan. BF to transport.
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
## 2021-06-09 DIAGNOSIS — F3164 Bipolar disorder, current episode mixed, severe, with psychotic features: Principal | ICD-10-CM

## 2021-06-09 LAB — LIPID PANEL
Cholesterol: 239 mg/dL — ABNORMAL HIGH (ref 0–200)
HDL: 53 mg/dL (ref >40)
LDL Cholesterol: 155 mg/dL — ABNORMAL HIGH (ref 0–99)
Total CHOL/HDL Ratio: 4.5 RATIO
Triglycerides: 155 mg/dL — ABNORMAL HIGH (ref <150)
VLDL: 31 mg/dL (ref 0–40)

## 2021-06-09 LAB — HEMOGLOBIN A1C
Hgb A1c MFr Bld: 5.9 % — ABNORMAL HIGH (ref 4.8–5.6)
Mean Plasma Glucose: 122.63 mg/dL

## 2021-06-09 MED ORDER — LURASIDONE HCL 40 MG PO TABS
40.0000 mg | ORAL_TABLET | Freq: Every day | ORAL | Status: DC
  Administered 2021-06-09 – 2021-06-14 (×6): 40 mg via ORAL
  Filled 2021-06-09 (×6): qty 1

## 2021-06-09 NOTE — Progress Notes (Signed)
[facility]/BMU LCSW Progress Note   06/09/2021    5:03 PM  [name]   [id number]   Type of Contact and Topic:  Release of Information  [name], patient, provided written consent for CSW team to coordinate aftercare at this time. CSW to have further conversations with patient regarding care in the community.   Patient is seen in the commuinity by Dr. Excell Seltzer with the [facility] and recieves therapy from [facility]. Patient lives in [place] and has [employer] ins.  Signed:  Corky Crafts, MSW, LCSWA, LCAS 06/09/2021 5:03 PM

## 2021-06-09 NOTE — [facility] Suicide Risk Assessment (Signed)
[facility] INPATIENT:  Family/Significant Other Suicide Prevention Education  Suicide Prevention Education:  Patient Refusal for Family/Significant Other Suicide Prevention Education: The patient [name] has refused to provide written consent for family/significant other to be provided Family/Significant Other Suicide Prevention Education during admission and/or prior to discharge.  Physician notified.  Patient provided consent and contact information to a Ezequiel Essex [phone]) which is not a working phone number. CSW team to have further conversations with patient about suicide education.    Corky Crafts 06/09/2021, 5:04 PM

## 2021-06-09 NOTE — Progress Notes (Signed)
Recreation Therapy Notes  Date: 06/09/2021  Time: 10:20 am    Location: Craft room    Behavioral response: Appropriate  Intervention Topic: Time Management    Discussion/Intervention:  Group content today was focused on time management. The group defined time management and identified healthy ways to manage time. Individuals expressed how much of the 24 hours they use in a day. Patients expressed how much time they use just for themselves personally. The group expressed how they have managed their time in the past. Individuals participated in the intervention Managing Life where they had a chance to see how much of the 24 hours they use and where it goes. Clinical Observations/Feedback: Patient came to group and was sleep on off. She stated that she uses a calendar to prioritize things to manage her time. Individual became agitated and was not able to be redirected, participant was asked to return to her room.  Hiroko Tregre LRT/CTRS         [name] 06/09/2021 12:15 PM
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
## 2021-06-09 NOTE — Group Note (Signed)
LCSW Group Therapy Note  Group Date: 06/09/2021 Start Time: 1300 End Time: 1400   Type of Therapy and Topic:  Group Therapy - Healthy vs Unhealthy Coping Skills  Participation Level:  Did Not Attend   Description of Group The focus of this group was to determine what unhealthy coping techniques typically are used by group members and what healthy coping techniques would be helpful in coping with various problems. Patients were guided in becoming aware of the differences between healthy and unhealthy coping techniques. Patients were asked to identify 2-3 healthy coping skills they would like to learn to use more effectively.  Therapeutic Goals Patients learned that coping is what human beings do all day long to deal with various situations in their lives Patients defined and discussed healthy vs unhealthy coping techniques Patients identified their preferred coping techniques and identified whether these were healthy or unhealthy Patients determined 2-3 healthy coping skills they would like to become more familiar with and use more often. Patients provided support and ideas to each other   Summary of Patient Progress:  Patient did not attend group despite encouraged participation.    Therapeutic Modalities Cognitive Behavioral Therapy Motivational Interviewing  Corky Crafts, [place] 06/09/2021  2:01 PM

## 2021-06-09 NOTE — Progress Notes (Signed)
°  ADMISSION DAR NOTE:   Pt presented as under involuntary status. Alert and oriented by 3. Pt observed with sad  affect, logical speech and fair eye contact.  Patient is tearful stating " I am just thinking is this my new life of staying in the hospital, I was with my friends playing basketball and I hit my head and  I told my boyfriend to send me to the emergency room because I thought I had a concussion but my boyfriend said I was acting crazy I told him he was making me crazy. My boyfriend said he does not have time to baby sit me all the time he has to go to work. I don't think I did anything bad I was taking my medication Risperdal and it was working fine I was on Lithium but they stopped it at [facility] because they said it does not work well with my diet. I feel like I can never be out of the hospital now'  Patient stated she wants to move to [place] to be with her Mother and older son when she gets discharged. She is a mother of 3 her other two kids are her in [place] they stay with their Father. 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 She denies SI/HI/A/VH and verbally contracted for safety. She does not appear to be responding to internal stimuli. Patient stated she has been sleeping well about 7 hours each night with no problems.  Emotional support and availability offered to Patient as needed. Skin assessment done Patient noted with bruises on her back which she said she  thinks she got them when she was playing basketball with her friends. Belongings searched per protocol. Items deemed contraband secured in locker. Unit orientation and routine discussed, Care Plan reviewed as well and Patient verbalized understanding. Fluids and Food offered, tolerated well. Q15 minutes safety checks initiated without self harm gestures.

## 2021-06-09 NOTE — H&P (Signed)
Psychiatric Admission Assessment Adult  Patient Identification: [name] MRN:  [id number] Date of Evaluation:  06/09/2021 Chief Complaint:  Psychosis [facility]) [F29] Principal Diagnosis: Bipolar I disorder, most recent episode mixed, severe with psychotic features ([place]) Diagnosis:  Principal Problem:   Bipolar I disorder, most recent episode mixed, severe with psychotic features ([place]) Active Problems:   Bipolar 1 disorder, mixed ([place])   H/O: hypothyroidism   Psychosis ([place])  History of Present Illness: Patient seen and chart reviewed.  Got collateral information from her significant other as well.  51 year old woman with a history of bipolar disorder was brought to the emergency room by her significant other yesterday because of a decompensation back into psychotic symptoms.  She had recently been admitted to the [facility] psychiatric hospital and he says that when she got out this weekend she was initially doing okay for about 2 days but then at the end of the weekend started to fall apart becoming confused and agitated with bizarre behaviors paranoia and strange mood lability.  Went back to not sleeping at all.  Patient is tearful during the interview and sometimes a little hard to follow.  Some of what she reports is an accurate.  She claims that she was discharged from [facility] less than 24 hours before coming to our emergency room which is not correct.  She apparently came into the emergency room complaining that she had struck her head on the ground and giving a variety of explanations for this.  There was no evidence of any injury and her significant other said that he had been with her throughout the whole time and there was no report of any injury to her head.  Patient claims to have been compliant with medications after getting out of the hospital but her discussion of it is hard to follow and she seems to leave open the possibility that she may have been at least partially  noncompliant with the lithium.  She seems a little bit paranoid.  Cries a lot during the interview talking about her significant other and ways that suggest that she is blaming him for her problems.  Her boyfriend was quite appropriate on the phone and tells me that she had been doing fine with her mental health until the middle of January when she seemed to decompensate perhaps related to a stress that she had at work.  Patient denies any drug or alcohol use.  When she was discharged from [facility] she was listed as being on lithium Seroquel and Risperdal although the patient claims that she was not taking any Seroquel and claims to have no memory of that. Associated Signs/Symptoms: Depression Symptoms:  depressed mood, psychomotor agitation, feelings of worthlessness/guilt, impaired memory, suicidal thoughts without plan, Duration of Depression Symptoms: Less than two weeks  (Hypo) Manic Symptoms:  Distractibility, Flight of Ideas, Impulsivity, Labiality of Mood, Anxiety Symptoms:  Excessive Worry, Psychotic Symptoms:  Ideas of Reference, Paranoia, PTSD Symptoms: Negative Total Time spent with patient: 1 hour  Past Psychiatric History: Patient has a history of bipolar disorder diagnosis going back to her 30s.  We have had 1 prior admission of her to our facility in 2021.  At that time she was having a manic psychosis and responded to the combination of lithium and Latuda.  She tells me that she took the medicine for almost a year after discharge but then stopped it with the supposedly consent of her psychiatric prescriber.  She says that she thought that she was  managing her condition entirely by going on a strict Keto diet and having therapy.  Is the patient at risk to self? Yes.    Has the patient been a risk to self in the past 6 months? Yes.    Has the patient been a risk to self within the distant past? Yes.    Is the patient a risk to others? No.  Has the patient been a risk to  others in the past 6 months? No.  Has the patient been a risk to others within the distant past? No.   Prior Inpatient Therapy:   Prior Outpatient Therapy:    Alcohol Screening: Patient refused Alcohol Screening Tool: Yes 1. How often do you have a drink containing alcohol?: Never 2. How many drinks containing alcohol do you have on a typical day when you are drinking?: 1 or 2 3. How often do you have six or more drinks on one occasion?: Never AUDIT-C Score: 0 4. How often during the last year have you found that you were not able to stop drinking once you had started?: Never 5. How often during the last year have you failed to do what was normally expected from you because of drinking?: Never 6. How often during the last year have you needed a first drink in the morning to get yourself going after a heavy drinking session?: Never 7. How often during the last year have you had a feeling of guilt of remorse after drinking?: Never 8. How often during the last year have you been unable to remember what happened the night before because you had been drinking?: Never 9. Have you or someone else been injured as a result of your drinking?: No 10. Has a relative or friend or a doctor or another health worker been concerned about your drinking or suggested you cut down?: No Alcohol Use Disorder Identification Test Final Score (AUDIT): 0 Substance Abuse History in the last 12 months:  No. Consequences of Substance Abuse: Negative Previous Psychotropic Medications: Yes  Psychological Evaluations: Yes  Past Medical History:  Past Medical History:  Diagnosis Date   Hyperlipemia    Other bipolar disorders    Other malaise and fatigue    Unspecified hypothyroidism     Past Surgical History:  Procedure Laterality Date   radioablation of thyroid Bilateral    Family History:  Family History  Problem Relation Age of Onset   Mental illness Mother    Bipolar disorder Mother    Heart disease  Mother    Hypertension Father    Hypertension Sister    Depression Brother    Hypertension Sister    Alzheimer's disease Maternal Grandmother    Heart attack Maternal Grandfather    Family Psychiatric  History: Mental illness including bipolar disorder present and some close family members Tobacco Screening:   Social History:  Social History   Substance and Sexual Activity  Alcohol Use No   Alcohol/week: 0.0 standard drinks     Social History   Substance and Sexual Activity  Drug Use No    Additional Social History:                           Allergies:   Allergies  Allergen Reactions   Chocolate Hives   Coffee Bean Extract [Coffea Arabica] Hives   Lab Results:  Results for orders placed or performed during the hospital encounter of 06/08/21 (from the past 48 hour(s))  Lipid  panel     Status: Abnormal   Collection Time: 06/09/21 11:20 AM  Result Value Ref Range   Cholesterol 239 (H) 0 - 200 mg/dL   Triglycerides 155 (H) <150 mg/dL   HDL 53 >40 mg/dL   Total CHOL/HDL Ratio 4.5 RATIO   VLDL 31 0 - 40 mg/dL   LDL Cholesterol 155 (H) 0 - 99 mg/dL    Comment:        Total Cholesterol/HDL:CHD Risk Coronary Heart Disease Risk Table                     Men   Women  1/2 Average Risk   3.4   3.3  Average Risk       5.0   4.4  2 X Average Risk   9.6   7.1  3 X Average Risk  23.4   11.0        Use the calculated Patient Ratio above and the CHD Risk Table to determine the patient's CHD Risk.        ATP III CLASSIFICATION (LDL):  <100     mg/dL   Optimal  100-129  mg/dL   Near or Above                    Optimal  130-159  mg/dL   Borderline  160-189  mg/dL   High  >190     mg/dL   Very High Performed at [facility], [address] [place], [place] [address]     Blood Alcohol level:  Lab Results  Component Value Date   [facility] <10 06/08/2021   ETH <10 [id number]    Metabolic Disorder Labs:  Lab Results  Component Value Date   HGBA1C 6.3  10/16/2018   MPG 119.76 03/16/2017   No results found for: PROLACTIN Lab Results  Component Value Date   CHOL 239 (H) 06/09/2021   TRIG 155 (H) 06/09/2021   HDL 53 06/09/2021   CHOLHDL 4.5 06/09/2021   VLDL 31 06/09/2021   LDLCALC 155 (H) 06/09/2021   LDLCALC 258 (H) 05/18/2021    Current Medications: Current Facility-Administered Medications  Medication Dose Route Frequency Provider Last Rate Last Admin   acetaminophen (TYLENOL) tablet 650 mg  650 mg Oral Q6H PRN Sherlon Handing, NP       alum & mag hydroxide-simeth (MAALOX/MYLANTA) 200-200-20 MG/5ML suspension 30 mL  30 mL Oral Q4H PRN Sherlon Handing, NP       hydrOXYzine (ATARAX) tablet 25 mg  25 mg Oral Q6H PRN Sherlon Handing, NP       lithium carbonate (ESKALITH) CR tablet 900 mg  900 mg Oral QHS Waldon Merl F, NP   900 mg at 06/08/21 2125   lurasidone (LATUDA) tablet 40 mg  40 mg Oral Q supper Lew Prout, [name], MD       magnesium hydroxide (MILK OF MAGNESIA) suspension 30 mL  30 mL Oral Daily PRN Sherlon Handing, NP       PTA Medications: Medications Prior to Admission  Medication Sig Dispense Refill Last Dose   lithium carbonate (LITHOBID) 300 MG CR tablet Take 900 mg by mouth at bedtime.   06/07/2021 at 1845   QUEtiapine (SEROQUEL) 400 MG tablet Take 400 mg by mouth at bedtime.   06/07/2021 at 1845   risperidone (RISPERDAL) 4 MG tablet Take 4 mg by mouth 2 (two) times daily.   06/08/2021 at 0600   hydrOXYzine (VISTARIL)  25 MG capsule Take 25 mg by mouth every 6 (six) hours as needed for anxiety.   prn at unknown   levothyroxine (SYNTHROID) 50 MCG tablet Take 50 mcg by mouth daily. (Patient not taking: Reported on 06/08/2021)   Not Taking   lithium carbonate (ESKALITH) 450 MG CR tablet Take 2 tablets (900 mg total) by mouth at bedtime. (Patient not taking: Reported on 06/08/2021) 60 tablet 1 Not Taking   LORazepam (ATIVAN) 1 MG tablet Take 1 mg by mouth 2 (two) times daily. (Patient not taking: Reported on 06/08/2021)    Not Taking   OLANZapine zydis (ZYPREXA) 10 MG disintegrating tablet Take 1 tablet (10 mg total) by mouth daily. (Patient not taking: Reported on 06/08/2021)   Not Taking   traZODone (DESYREL) 50 MG tablet Take 1 tablet (50 mg total) by mouth at bedtime as needed for sleep. (Patient not taking: Reported on 06/08/2021) 10 tablet 0 Not Taking    Musculoskeletal: Strength & Muscle Tone: within normal limits Gait & Station: normal Patient leans: N/A            Psychiatric Specialty Exam:  Presentation  General Appearance: Disheveled  Eye Contact:Fleeting  Speech:Slow  Speech Volume:Normal  Handedness:Right   Mood and Affect  Mood:Anxious; Depressed  Affect:Blunt   Thought Process  Thought Processes:Disorganized  Duration of Psychotic Symptoms: No data recorded Past Diagnosis of Schizophrenia or Psychoactive disorder: No  Descriptions of Associations:Loose  Orientation:Full (Time, Place and Person)  Thought Content:Scattered  Hallucinations:Hallucinations: None  Ideas of Reference:None  Suicidal Thoughts:Suicidal Thoughts: No  Homicidal Thoughts:Homicidal Thoughts: No   Sensorium  Memory:Immediate Fair  Judgment:Fair  Insight:Fair   Executive Functions  Concentration:Fair  Attention Span:Fair  Recall:Fair  Fund of Knowledge:Fair  Language:Fair   Psychomotor Activity  Psychomotor Activity:Psychomotor Activity: Decreased   Assets  Assets:Desire for Improvement; Financial Resources/Insurance; Housing; Intimacy; Resilience   Sleep  Sleep:Sleep: Fair    Physical Exam: Physical Exam Vitals and nursing note reviewed.  Constitutional:      Appearance: Normal appearance.  HENT:     Head: Normocephalic and atraumatic.     Mouth/Throat:     Pharynx: Oropharynx is clear.  Eyes:     Pupils: Pupils are equal, round, and reactive to light.  Cardiovascular:     Rate and Rhythm: Normal rate and regular rhythm.  Pulmonary:     Effort:  Pulmonary effort is normal.     Breath sounds: Normal breath sounds.  Abdominal:     General: Abdomen is flat.     Palpations: Abdomen is soft.  Musculoskeletal:        General: Normal range of motion.  Skin:    General: Skin is warm and dry.  Neurological:     General: No focal deficit present.     Mental Status: She is alert. Mental status is at baseline.  Psychiatric:        Attention and Perception: She is inattentive.        Mood and Affect: Mood is anxious and depressed. Affect is labile.        Speech: She is noncommunicative. Speech is tangential.        Behavior: Behavior is agitated. Behavior is not aggressive.        Thought Content: Thought content includes suicidal ideation. Thought content does not include suicidal plan.        Cognition and Memory: Cognition is impaired. Memory is impaired.        Judgment: Judgment is impulsive.  Review of Systems  Constitutional: Negative.   HENT: Negative.    Eyes: Negative.   Respiratory: Negative.    Cardiovascular: Negative.   Gastrointestinal: Negative.   Musculoskeletal: Negative.   Skin: Negative.   Neurological: Negative.   Psychiatric/Behavioral:  Positive for memory loss. Negative for depression, substance abuse and suicidal ideas. The patient is nervous/anxious and has insomnia.   Blood pressure 106/80, pulse 99, temperature 98.5 F (36.9 C), temperature source Oral, resp. rate 18, height 5\' 1"  (1.549 m), weight 65.8 kg, SpO2 (!) 86 %. Body mass index is 27.4 kg/m.  Treatment Plan Summary: Daily contact with patient to assess and evaluate symptoms and progress in treatment, Medication management, and Plan I suggested to the patient that we restart her on lithium and Latuda since she responded well to the combination in the past.  Her concern about Latuda was simply the expense but it sounds like that is something that can be managed and I reminded her that Anette Guarneri has a lower known risk burden for weight gain and some  other side effects.  Patient was agreeable to this.  She agreed to my calling her boyfriend and I have also left a message to get in touch with her long-term therapist.  We will have treatment team tomorrow we will include her in individual and group therapy.  Ongoing daily assessment of needs.  Continue Synthroid.  Labs will be reviewed including getting a hemoglobin A1c and lipid panel  Observation Level/Precautions:  15 minute checks  Laboratory:  HbAIC  Psychotherapy:    Medications:    Consultations:    Discharge Concerns:    Estimated LOS:  Other:     Physician Treatment Plan for Primary Diagnosis: Bipolar I disorder, most recent episode mixed, severe with psychotic features ([place]) Long Term Goal(s): Improvement in symptoms so as ready for discharge  Short Term Goals: Ability to verbalize feelings will improve, Ability to disclose and discuss suicidal ideas, and Ability to demonstrate self-control will improve  Physician Treatment Plan for Secondary Diagnosis: Principal Problem:   Bipolar I disorder, most recent episode mixed, severe with psychotic features ([place]) Active Problems:   Bipolar 1 disorder, mixed ([place])   H/O: hypothyroidism   Psychosis ([place])  Long Term Goal(s): Improvement in symptoms so as ready for discharge  Short Term Goals: Ability to maintain clinical measurements within normal limits will improve and Compliance with prescribed medications will improve  I certify that inpatient services furnished can reasonably be expected to improve the patient's condition.    Alethia Berthold, MD 2/7/202312:47 PM

## 2021-06-09 NOTE — Tx Team (Signed)
Initial Treatment Plan 06/09/2021 12:23 AM [name] [id number]    PATIENT STRESSORS: Health problems     PATIENT STRENGTHS: Ability for insight  Communication skills  General fund of knowledge  Motivation for treatment/growth    PATIENT IDENTIFIED PROBLEMS: "I had ringing in my heard I thought I fell"  Disorganized thought                   DISCHARGE CRITERIA:  Ability to meet basic life and health needs Verbal commitment to aftercare and medication compliance  PRELIMINARY DISCHARGE PLAN: Outpatient therapy Return to previous living arrangement Return to previous work or school arrangements  PATIENT/FAMILY INVOLVEMENT: This treatment plan has been presented to and reviewed with the patient, [name], and/or family member.  The patient and family have been given the opportunity to ask questions and make suggestions.  Margarita Rana, RN 06/09/2021, 12:23 AM

## 2021-06-10 ENCOUNTER — Inpatient Hospital Stay: Payer: BC Managed Care – PPO

## 2021-06-10 NOTE — Plan of Care (Signed)
See progress note for details  Problem: Education: Goal: Knowledge of [facility] Education information/materials will improve Outcome: Not Progressing Goal: Emotional status will improve Outcome: Not Progressing Goal: Mental status will improve Outcome: Not Progressing Goal: Verbalization of understanding the information provided will improve Outcome: Not Progressing   Problem: Activity: Goal: Interest or engagement in activities will improve Outcome: Not Progressing Goal: Sleeping patterns will improve Outcome: Not Progressing   Problem: Coping: Goal: Ability to verbalize frustrations and anger appropriately will improve Outcome: Not Progressing Goal: Ability to demonstrate self-control will improve Outcome: Not Progressing   Problem: Health Behavior/Discharge Planning: Goal: Identification of resources available to assist in meeting health care needs will improve Outcome: Not Progressing Goal: Compliance with treatment plan for underlying cause of condition will improve Outcome: Not Progressing   Problem: Physical Regulation: Goal: Ability to maintain clinical measurements within normal limits will improve Outcome: Not Progressing   Problem: Physical Regulation: Goal: Ability to maintain clinical measurements within normal limits will improve Outcome: Not Progressing   Problem: Safety: Goal: Periods of time without injury will increase Outcome: Not Progressing   Problem: Activity: Goal: Will verbalize the importance of balancing activity with adequate rest periods Outcome: Not Progressing   Problem: Education: Goal: Will be free of psychotic symptoms Outcome: Not Progressing Goal: Knowledge of the prescribed therapeutic regimen will improve Outcome: Not Progressing   Problem: Coping: Goal: Coping ability will improve Outcome: Not Progressing Goal: Will verbalize feelings Outcome: Not Progressing   Problem: Health Behavior/Discharge Planning: Goal:  Compliance with prescribed medication regimen will improve Outcome: Not Progressing   Problem: Nutritional: Goal: Ability to achieve adequate nutritional intake will improve Outcome: Not Progressing   Problem: Role Relationship: Goal: Ability to communicate needs accurately will improve Outcome: Not Progressing Goal: Ability to interact with others will improve Outcome: Not Progressing   Problem: Safety: Goal: Ability to redirect hostility and anger into socially appropriate behaviors will improve Outcome: Not Progressing Goal: Ability to remain free from injury will improve Outcome: Not Progressing   Problem: Self-Care: Goal: Ability to participate in self-care as condition permits will improve Outcome: Not Progressing   Problem: Self-Concept: Goal: Will verbalize positive feelings about self Outcome: Not Progressing   Problem: Education: Goal: Ability to state activities that reduce stress will improve Outcome: Not Progressing   Problem: Coping: Goal: Ability to identify and develop effective coping behavior will improve Outcome: Not Progressing   Problem: Self-Concept: Goal: Ability to identify factors that promote anxiety will improve Outcome: Not Progressing Goal: Level of anxiety will decrease Outcome: Not Progressing Goal: Ability to modify response to factors that promote anxiety will improve Outcome: Not Progressing

## 2021-06-10 NOTE — Group Note (Signed)
[facility] LCSW Group Therapy Note   Group Date: 06/10/2021 Start Time: 1300 End Time: 1400   Type of Therapy/Topic:  Group Therapy:  Emotion Regulation  Participation Level:  Minimal    Description of Group:    The purpose of this group is to assist patients in learning to regulate negative emotions and experience positive emotions. Patients will be guided to discuss ways in which they have been vulnerable to their negative emotions. These vulnerabilities will be juxtaposed with experiences of positive emotions or situations, and patients challenged to use positive emotions to combat negative ones. Special emphasis will be placed on coping with negative emotions in conflict situations, and patients will process healthy conflict resolution skills.  Therapeutic Goals: Patient will identify two positive emotions or experiences to reflect on in order to balance out negative emotions:  Patient will label two or more emotions that they find the most difficult to experience:  Patient will be able to demonstrate positive conflict resolution skills through discussion or role plays:   Summary of Patient Progress:  Patient was unable to engage appropriately in group, patient was able to be redirected and remained on topic throughout the remainder of the group.Patient was present for the entire group; participated to the best of their ability. Patient showed insight, attempted to provide helpful advice to others. Added nuance to conversation and displayed insight into their own situation.      Therapeutic Modalities:   Cognitive Behavioral Therapy Feelings Identification Dialectical Behavioral Therapy   Starleen Arms, Student-Social Work

## 2021-06-10 NOTE — Progress Notes (Signed)
Patient was tearful on the telephone this evening. She is complaint with medications. Required redirections to go to bed this shift. Has been dancing in the dayroom in front of a female Peer. Prn vistaril given at 0258 for anxiety and effective by 0340. Patient redirectable and able to go back to sleep.   Support and encouragement provided.
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
## 2021-06-10 NOTE — Progress Notes (Signed)
D: Pt alert and oriented. Pt rates depression  and anxiety 0/10.  Pt reports sleep last night as being good. Pt denies experiencing any pain at this time. Pt denies experiencing any SI/HI, or AVH at this time. Patient is disorganized and liable. Participates in groups but needs redirection throughout.   A: Scheduled and prn medications administered to pt, per MD orders. Support and encouragement provided. Frequent verbal contact made. Routine safety checks conducted q15 minutes.   R: No adverse drug reactions noted. Pt remains safe at this time. Will continue to monitor.

## 2021-06-10 NOTE — BH IP Treatment Plan (Signed)
Interdisciplinary Treatment and Diagnostic Plan Update  06/10/2021 Time of Session: Many MRN: [id number]  Principal Diagnosis: Bipolar I disorder, most recent episode mixed, severe with psychotic features ([place])  Secondary Diagnoses: Principal Problem:   Bipolar I disorder, most recent episode mixed, severe with psychotic features ([place]) Active Problems:   Bipolar 1 disorder, mixed ([place])   H/O: hypothyroidism   Psychosis ([place])   Current Medications:  Current Facility-Administered Medications  Medication Dose Route Frequency Provider Last Rate Last Admin   acetaminophen (TYLENOL) tablet 650 mg  650 mg Oral Q6H PRN Sherlon Handing, NP       alum & mag hydroxide-simeth (MAALOX/MYLANTA) 200-200-20 MG/5ML suspension 30 mL  30 mL Oral Q4H PRN Sherlon Handing, NP       hydrOXYzine (ATARAX) tablet 25 mg  25 mg Oral Q6H PRN Sherlon Handing, NP   25 mg at 06/10/21 0258   lithium carbonate (ESKALITH) CR tablet 900 mg  900 mg Oral QHS Waldon Merl F, NP   900 mg at 06/09/21 2103   lurasidone (LATUDA) tablet 40 mg  40 mg Oral Q supper [name], [name], MD   40 mg at 06/09/21 1710   magnesium hydroxide (MILK OF MAGNESIA) suspension 30 mL  30 mL Oral Daily PRN Sherlon Handing, NP       PTA Medications: Medications Prior to Admission  Medication Sig Dispense Refill Last Dose   lithium carbonate (LITHOBID) 300 MG CR tablet Take 900 mg by mouth at bedtime.   06/07/2021 at 1845   QUEtiapine (SEROQUEL) 400 MG tablet Take 400 mg by mouth at bedtime.   06/07/2021 at 1845   risperidone (RISPERDAL) 4 MG tablet Take 4 mg by mouth 2 (two) times daily.   06/08/2021 at 0600   hydrOXYzine (VISTARIL) 25 MG capsule Take 25 mg by mouth every 6 (six) hours as needed for anxiety.   prn at unknown   levothyroxine (SYNTHROID) 50 MCG tablet Take 50 mcg by mouth daily. (Patient not taking: Reported on 06/08/2021)   Not Taking   lithium carbonate (ESKALITH) 450 MG CR tablet Take 2  tablets (900 mg total) by mouth at bedtime. (Patient not taking: Reported on 06/08/2021) 60 tablet 1 Not Taking   LORazepam (ATIVAN) 1 MG tablet Take 1 mg by mouth 2 (two) times daily. (Patient not taking: Reported on 06/08/2021)   Not Taking   OLANZapine zydis (ZYPREXA) 10 MG disintegrating tablet Take 1 tablet (10 mg total) by mouth daily. (Patient not taking: Reported on 06/08/2021)   Not Taking   traZODone (DESYREL) 50 MG tablet Take 1 tablet (50 mg total) by mouth at bedtime as needed for sleep. (Patient not taking: Reported on 06/08/2021) 10 tablet 0 Not Taking    Patient Stressors: Health problems    Patient Strengths: Ability for insight  Communication skills  General fund of knowledge  Motivation for treatment/growth   Treatment Modalities: Medication Management, Group therapy, Case management,  1 to 1 session with clinician, Psychoeducation, Recreational therapy.   Physician Treatment Plan for Primary Diagnosis: Bipolar I disorder, most recent episode mixed, severe with psychotic features ([place]) Long Term Goal(s): Improvement in symptoms so as ready for discharge   Short Term Goals: Ability to maintain clinical measurements within normal limits will improve Compliance with prescribed medications will improve Ability to verbalize feelings will improve Ability to disclose and discuss suicidal ideas Ability to demonstrate self-control will improve  Medication Management: Evaluate patient's response, side effects, and tolerance of  medication regimen.  Therapeutic Interventions: 1 to 1 sessions, Unit Group sessions and Medication administration.  Evaluation of Outcomes: Not Progressing  Physician Treatment Plan for Secondary Diagnosis: Principal Problem:   Bipolar I disorder, most recent episode mixed, severe with psychotic features ([facility]) Active Problems:   Bipolar 1 disorder, mixed ([facility])   H/O: hypothyroidism   Psychosis ([facility])  Long Term Goal(s): Improvement in symptoms so as  ready for discharge   Short Term Goals: Ability to maintain clinical measurements within normal limits will improve Compliance with prescribed medications will improve Ability to verbalize feelings will improve Ability to disclose and discuss suicidal ideas Ability to demonstrate self-control will improve     Medication Management: Evaluate patient's response, side effects, and tolerance of medication regimen.  Therapeutic Interventions: 1 to 1 sessions, Unit Group sessions and Medication administration.  Evaluation of Outcomes: Not Progressing   RN Treatment Plan for Primary Diagnosis: Bipolar I disorder, most recent episode mixed, severe with psychotic features ([facility]) Long Term Goal(s): Knowledge of disease and therapeutic regimen to maintain health will improve  Short Term Goals: Ability to demonstrate self-control, Ability to participate in decision making will improve, Ability to verbalize feelings will improve, Ability to disclose and discuss suicidal ideas, Ability to identify and develop effective coping behaviors will improve, and Compliance with prescribed medications will improve  Medication Management: RN will administer medications as ordered by provider, will assess and evaluate patient's response and provide education to patient for prescribed medication. RN will report any adverse and/or side effects to prescribing provider.  Therapeutic Interventions: 1 on 1 counseling sessions, Psychoeducation, Medication administration, Evaluate responses to treatment, Monitor vital signs and CBGs as ordered, Perform/monitor CIWA, COWS, AIMS and Fall Risk screenings as ordered, Perform wound care treatments as ordered.  Evaluation of Outcomes: Not Progressing   LCSW Treatment Plan for Primary Diagnosis: Bipolar I disorder, most recent episode mixed, severe with psychotic features ([facility]) Long Term Goal(s): Safe transition to appropriate next level of care at discharge, Engage patient in  therapeutic group addressing interpersonal concerns.  Short Term Goals: Engage patient in aftercare planning with referrals and resources, Increase social support, Increase ability to appropriately verbalize feelings, Increase emotional regulation, Identify triggers associated with mental health/substance abuse issues, and Increase skills for wellness and recovery  Therapeutic Interventions: Assess for all discharge needs, 1 to 1 time with Social worker, Explore available resources and support systems, Assess for adequacy in community support network, Educate family and significant other(s) on suicide prevention, Complete Psychosocial Assessment, Interpersonal group therapy.  Evaluation of Outcomes: Not Progressing   Progress in Treatment: Attending groups: No. Participating in groups: No. Taking medication as prescribed: Yes. Toleration medication: Yes. Family/Significant other contact made: No, will contact:  Patient refused consent to contact family or significant other. Patient understands diagnosis: No. Discussing patient identified problems/goals with staff: Yes. Medical problems stabilized or resolved: No. Denies suicidal/homicidal ideation: Yes. Issues/concerns per patient self-inventory: Yes. Other: Patient very tearful in treatment team meeting this morning.  New problem(s) identified: No, Describe:  None  New Short Term/Long Term Goal(s): Patient to work towards elimination of symptoms of psychosis, medication management for mood stabilization; elimination of SI thoughts; development of comprehensive mental wellness plan.  Patient Goals:  "Make sure I am safe from boyfriend so I can be discharged" "Make sure my kids are safe"  Discharge Plan or Barriers: CSW will assist patient with discharge options.   Reason for Continuation of Hospitalization: Anxiety Delusions  Hallucinations  Estimated Length of Stay:1-7  days   Scribe for Treatment Team: Vesta Mixer Work 06/10/2021 11:37 AM

## 2021-06-11 NOTE — Progress Notes (Signed)
D: Pt awake, alert and oriented this shift. . Pt rates depression, hopelessness and anxiety a 0/10.  Pt reports energy level as good and concentration as being good. Pt reports difficulty sleeping last night. MD to be notified.  .Pt denies c/o edema to L foot at this time. Encouraged to elevate foot to reduce swelling. Compression sock ordered. Pt denies experiencing any SI/HI, or AVH at this time. Patient needs frequent redirection throughout the day for child like behavior. Continues be hyper and have poor boundaries.   A: Scheduled medications administered to pt, per MD orders. Support and encouragement provided. Frequent verbal contact made. Routine safety checks conducted q15 minutes.   R: No adverse drug reactions noted. Pt verbally contracts for safety at this time. Pt complaint with medications and treatment plan. Pt interacts well with others on the unit. Pt remains safe at this time. Will continue to monitor.
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
## 2021-06-11 NOTE — Group Note (Signed)
LCSW Group Therapy Note  Group Date: 06/11/2021 Start Time: 1300 End Time: 1400   Type of Therapy and Topic:  Group Therapy - How To Cope with Nervousness about Discharge   Participation Level:  Did Not Attend   Description of Group This process group involved identification of patients' feelings about discharge. Some of them are scheduled to be discharged soon, while others are new admissions, but each of them was asked to share thoughts and feelings surrounding discharge from the hospital. One common theme was that they are excited at the prospect of going home, while another was that many of them are apprehensive about sharing why they were hospitalized. Patients were given the opportunity to discuss these feelings with their peers in preparation for discharge.  Therapeutic Goals  Patient will identify their overall feelings about pending discharge. Patient will think about how they might proactively address issues that they believe will once again arise once they get home (i.e. with parents). Patients will participate in discussion about having hope for change.   Summary of Patient Progress:   X   Therapeutic Modalities Cognitive Behavioral Therapy   Harden Mo, LCSWA 06/11/2021  1:47 PM

## 2021-06-12 MED ORDER — MENTHOL 3 MG MT LOZG
1.0000 | LOZENGE | OROMUCOSAL | Status: DC | PRN
  Administered 2021-06-12: 3 mg via ORAL
  Filled 2021-06-12: qty 9

## 2021-06-12 NOTE — Progress Notes (Signed)
Patient pleasant and cooperative. Easy to engage in conversation. She is med compliant and received her QHS meds without incident.  She is active on the unit and is visible in the dayroom interacting well with others.  Will continue to monitor with q15 minute safety checks. Encouraged her to come to staff with any concersn.    C Butler-Nicholson, LPN

## 2021-06-12 NOTE — Progress Notes (Signed)
Patient is pleasant and cooperative.  Easy to engage in conversation. Has been pacing the halls singing softly to herself this evening.  She is easily redirected when she starts to get too loud, but apologizes and quickly goes back into singing quietly. She is med complaint and received her QHS meds without incident. She denies si  hi avh at this encounter. Reports that she had stopped taking her Lithium medication in lieu of trying an alternative means to maintain her mental health using a strict Keto diet with exercise and mediation. Then she realized she needed her Lithium when she started becoming overwhelmed with school stressors. Reports that her boyfriend was adamant that she needed to be hospitalized for stabilization.  She realizes this now although she did not agree with him in the beginning.  She is med compliant this evening and received her meds without incident. Will continue to monitor with q 15 minute safety rounds. Encouraged her to seek staff with any concerns.     Rosalie Doctor, LPN
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
## 2021-06-12 NOTE — Plan of Care (Signed)
  Problem: Education: Goal: Knowledge of [place] [facility] Education information/materials will improve Outcome: Progressing Goal: Emotional status will improve Outcome: Progressing Goal: Mental status will improve Outcome: Progressing Goal: Verbalization of understanding the information provided will improve Outcome: Progressing   Problem: Activity: Goal: Interest or engagement in activities will improve Outcome: Progressing Goal: Sleeping patterns will improve Outcome: Progressing   Problem: Coping: Goal: Ability to verbalize frustrations and anger appropriately will improve Outcome: Progressing Goal: Ability to demonstrate self-control will improve Outcome: Progressing   

## 2021-06-12 NOTE — Plan of Care (Signed)
°  Problem: Education: Goal: Knowledge of [place] [facility] Education information/materials will improve Outcome: Progressing Goal: Emotional status will improve Outcome: Progressing Goal: Mental status will improve Outcome: Progressing Goal: Verbalization of understanding the information provided will improve Outcome: Progressing   Problem: Education: Goal: Knowledge of [place] [facility] Education information/materials will improve Outcome: Progressing Goal: Emotional status will improve Outcome: Progressing Goal: Mental status will improve Outcome: Progressing Goal: Verbalization of understanding the information provided will improve Outcome: Progressing   Problem: Activity: Goal: Interest or engagement in activities will improve Outcome: Progressing Goal: Sleeping patterns will improve Outcome: Progressing   Problem: Coping: Goal: Ability to verbalize frustrations and anger appropriately will improve Outcome: Progressing Goal: Ability to demonstrate self-control will improve Outcome: Progressing   Problem: Health Behavior/Discharge Planning: Goal: Identification of resources available to assist in meeting health care needs will improve Outcome: Progressing Goal: Compliance with treatment plan for underlying cause of condition will improve Outcome: Progressing   Problem: Physical Regulation: Goal: Ability to maintain clinical measurements within normal limits will improve Outcome: Progressing   Problem: Safety: Goal: Periods of time without injury will increase Outcome: Progressing   Problem: Activity: Goal: Will verbalize the importance of balancing activity with adequate rest periods Outcome: Progressing   Problem: Education: Goal: Will be free of psychotic symptoms Outcome: Progressing Goal: Knowledge of the prescribed therapeutic regimen will improve Outcome: Progressing   Problem: Coping: Goal: Coping ability will improve Outcome: Progressing Goal:  Will verbalize feelings Outcome: Progressing   Problem: Health Behavior/Discharge Planning: Goal: Compliance with prescribed medication regimen will improve Outcome: Progressing   Problem: Nutritional: Goal: Ability to achieve adequate nutritional intake will improve Outcome: Progressing   Problem: Role Relationship: Goal: Ability to communicate needs accurately will improve Outcome: Progressing Goal: Ability to interact with others will improve Outcome: Progressing

## 2021-06-12 NOTE — Progress Notes (Signed)
D: Pt alert and oriented. Pt rates depression and anxiety 0/10.  Pt reports energy level as good and concentration as being good. Pt reports sleep last night as being good. Pt denies experiencing any pain at this time. Pt denies experiencing any SI/HI, or AVH at this time.   A: Scheduled medications administered to pt, per MD orders. Support and encouragement provided. Frequent verbal contact made. Routine safety checks conducted q15 minutes.   R: No adverse drug reactions noted. Pt verbally contracts for safety at this time. Pt complaint with medications and treatment plan. Pt interacts well with others on the unit. Pt remains safe at this time. Will continue to monitor.

## 2021-06-12 NOTE — Plan of Care (Signed)
  Problem: Education: Goal: Knowledge of [facility] [facility] Education information/materials will improve Outcome: Progressing Goal: Emotional status will improve Outcome: Progressing Goal: Mental status will improve Outcome: Progressing Goal: Verbalization of understanding the information provided will improve Outcome: Progressing   Problem: Activity: Goal: Interest or engagement in activities will improve Outcome: Progressing Goal: Sleeping patterns will improve Outcome: Progressing   Problem: Coping: Goal: Ability to verbalize frustrations and anger appropriately will improve Outcome: Progressing Goal: Ability to demonstrate self-control will improve Outcome: Progressing   
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
## 2021-06-13 NOTE — Group Note (Signed)
LCSW Group Therapy Note  Group Date: 06/13/2021 Start Time: 1300 End Time: 1400   Type of Therapy and Topic:  Group Therapy - Healthy vs Unhealthy Coping Skills  Participation Level:  Active   Description of Group The focus of this group was to determine what unhealthy coping techniques typically are used by group members and what healthy coping techniques would be helpful in coping with various problems. Patients were guided in becoming aware of the differences between healthy and unhealthy coping techniques. Patients were asked to identify 2-3 healthy coping skills they would like to learn to use more effectively.  Therapeutic Goals Patients learned that coping is what human beings do all day long to deal with various situations in their lives Patients defined and discussed healthy vs unhealthy coping techniques Patients identified their preferred coping techniques and identified whether these were healthy or unhealthy Patients determined 2-3 healthy coping skills they would like to become more familiar with and use more often. Patients provided support and ideas to each other   Summary of Patient Progress: Patient was present for the entirety of the group session. Patient was an active listener and participated in the topic of discussion. Patient presented as hyper-verbal. Patient shared that she experienced her first manic episode in 2007 and feels like she has missed out on much of her children's life due to being hospitalized for her diagnosis of Bipolar Disorder. Patient expressed that she feels like when she expresses "joy", her partner interprets her behavior as manic. Patient expressed how she enjoys the feeling of mania, as it "feels like pure joy in my heart." Patient was able to identify that she was not sleeping well prior to being hospitalized, which is a trigger for her to begin exhibiting manic behavior. Patient processed emotions regarding her birds being rehomed due to her  recent difficulty caring for them. Patient identified exercise and yoga as healthy coping skills.     Therapeutic Modalities Cognitive Behavioral Therapy Motivational Interviewing  Marletta Lor 06/13/2021  4:42 PM

## 2021-06-13 NOTE — Progress Notes (Signed)
Patient oriented to unit. Patient cooperative during assessment. Patient denies anxiety and depression. Patient denies SI/HI/AVH. Patient compliant during medication medication administration.  Q15 minute safety checks maintained. Patient remains safe on the unit at this time.

## 2021-06-14 NOTE — [facility] Suicide Risk Assessment (Signed)
[facility] INPATIENT:  Family/Significant Other Suicide Prevention Education  Suicide Prevention Education:  Education Completed; Aleene Davidson, (boyfriend) [phone] has been identified by the patient as the family member/significant other with whom the patient will be residing, and identified as the person(s) who will aid the patient in the event of a mental health crisis (suicidal ideations/suicide attempt).  With written consent from the patient, the family member/significant other has been provided the following suicide prevention education, prior to the and/or following the discharge of the patient.  The suicide prevention education provided includes the following: Suicide risk factors Suicide prevention and interventions National Suicide Hotline telephone number [facility] assessment telephone number [facility] Emergency Assistance 911 [facility] and/or Residential Mobile Crisis Unit telephone number  Request made of family/significant other to: Remove weapons (e.g., guns, rifles, knives), all items previously/currently identified as safety concern.   Remove drugs/medications (over-the-counter, prescriptions, illicit drugs), all items previously/currently identified as a safety concern.  The family member/significant other verbalizes understanding of the suicide prevention education information provided.  The family member/significant other agrees to remove the items of safety concern listed above.  Ileana Ladd [place] 06/14/2021, 12:36 PM

## 2021-06-14 NOTE — Progress Notes (Addendum)
Patient denies pain. Patient states that their feet currently feel good. Patient cooperative during assessment. Patient denies anxiety and depression. Patient denies SI/HI/AVH. Patient compliant with medication administration.  Q15 minute safety checks maintained. Patient remains safe on the unit at this time.

## 2021-06-14 NOTE — Progress Notes (Signed)
Patients FMLA paperwork was given to patients boyfriend during visitation hours
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
## 2021-06-14 NOTE — Progress Notes (Signed)
Patient alert and oriented x 4 no distress noted her thoughts are thoughts are organized and coherent, speech non pressured no bizarre behavior interacting with staff appropriately and complaint with medication regimen. 15 minutes safety checks maintained will continue to monitor.

## 2021-06-15 MED ORDER — LURASIDONE HCL 40 MG PO TABS: 40.0000 mg | ORAL_TABLET | Freq: Every day | ORAL | 1 refills | Status: DC

## 2021-06-15 MED ORDER — HYDROXYZINE HCL 25 MG PO TABS: 25.0000 mg | ORAL_TABLET | Freq: Four times a day (QID) | ORAL | 1 refills | Status: DC | PRN

## 2021-06-15 MED ORDER — LITHIUM CARBONATE ER 450 MG PO TBCR: 900.0000 mg | EXTENDED_RELEASE_TABLET | Freq: Every day | ORAL | 1 refills | Status: AC

## 2021-06-15 NOTE — Progress Notes (Signed)
Patient alert and oriented x 4 no distress noted her thoughts are organized and coherent, no bizarre behavior interacting with staff appropriately and complaint with medication regimen. 15 minutes safety checks maintained will continue to monitor.

## 2021-06-15 NOTE — Plan of Care (Signed)
°  Problem: Education: Goal: Knowledge of [place] Education information/materials will improve Outcome: Adequate for Discharge Goal: Emotional status will improve Outcome: Adequate for Discharge Goal: Mental status will improve Outcome: Adequate for Discharge Goal: Verbalization of understanding the information provided will improve Outcome: Adequate for Discharge   Problem: Activity: Goal: Interest or engagement in activities will improve Outcome: Adequate for Discharge Goal: Sleeping patterns will improve Outcome: Adequate for Discharge   Problem: Coping: Goal: Ability to verbalize frustrations and anger appropriately will improve Outcome: Adequate for Discharge Goal: Ability to demonstrate self-control will improve Outcome: Adequate for Discharge   Problem: Health Behavior/Discharge Planning: Goal: Identification of resources available to assist in meeting health care needs will improve Outcome: Adequate for Discharge Goal: Compliance with treatment plan for underlying cause of condition will improve Outcome: Adequate for Discharge   Problem: Physical Regulation: Goal: Ability to maintain clinical measurements within normal limits will improve Outcome: Adequate for Discharge   Problem: Safety: Goal: Periods of time without injury will increase Outcome: Adequate for Discharge   Problem: Activity: Goal: Will verbalize the importance of balancing activity with adequate rest periods Outcome: Adequate for Discharge   Problem: Education: Goal: Will be free of psychotic symptoms Outcome: Adequate for Discharge Goal: Knowledge of the prescribed therapeutic regimen will improve Outcome: Adequate for Discharge   Problem: Coping: Goal: Coping ability will improve Outcome: Adequate for Discharge Goal: Will verbalize feelings Outcome: Adequate for Discharge   Problem: Health Behavior/Discharge Planning: Goal: Compliance with prescribed medication regimen will  improve Outcome: Adequate for Discharge   Problem: Nutritional: Goal: Ability to achieve adequate nutritional intake will improve Outcome: Adequate for Discharge   Problem: Role Relationship: Goal: Ability to communicate needs accurately will improve Outcome: Adequate for Discharge Goal: Ability to interact with others will improve Outcome: Adequate for Discharge   Problem: Safety: Goal: Ability to redirect hostility and anger into socially appropriate behaviors will improve Outcome: Adequate for Discharge Goal: Ability to remain free from injury will improve Outcome: Adequate for Discharge   Problem: Self-Care: Goal: Ability to participate in self-care as condition permits will improve Outcome: Adequate for Discharge   Problem: Self-Concept: Goal: Will verbalize positive feelings about self Outcome: Adequate for Discharge   Problem: Education: Goal: Ability to state activities that reduce stress will improve Outcome: Adequate for Discharge   Problem: Coping: Goal: Ability to identify and develop effective coping behavior will improve Outcome: Adequate for Discharge   Problem: Self-Concept: Goal: Ability to identify factors that promote anxiety will improve Outcome: Adequate for Discharge Goal: Level of anxiety will decrease Outcome: Adequate for Discharge Goal: Ability to modify response to factors that promote anxiety will improve Outcome: Adequate for Discharge   

## 2021-06-15 NOTE — Progress Notes (Signed)
°  [facility] Adult Case Management Discharge Plan :  Will you be returning to the same living situation after discharge:  Yes,  pt reports that she is returning home At discharge, do you have transportation home?: Yes,  pt reports that her boyfriend will provide transportation. Do you have the ability to pay for your medications: Yes,  BCBS  Release of information consent forms completed and in the chart;  Patient's signature needed at discharge.  Patient to Follow up at:  Follow-up Information     [facility] Follow up.   Why: Calls to [place] were not returned. Please follow up with current provider at discharge. Contact information: [address] [place], [place] [address] [phone]        [facility] Follow up.   Why: Your appointment is scheduled for 12PM on 04/16/2022.  Please bring discharge paperwork. Contact information: [address] [place] [address] [phone]                 Next level of care provider has access to [facility] Link:no  Safety Planning and Suicide Prevention discussed: Yes,  SPE completed with pt's significant other.      Has patient been referred to the Quitline?: Patient refused referral  Patient has been referred for addiction treatment: Pt. refused referral  Harden Mo, LCSW 06/15/2021, 11:19 AM

## 2021-06-15 NOTE — Progress Notes (Signed)
Patient ID: [name], female   DOB: [birth year], 51 y.o.   MRN: [id number]  Patient denies SI/HI/AVH. Belongings were returned to patient. Discharge instructions  including medication and follow up information were reviewed with patient and understanding was verbalized. Patient was not observed to be in distress at time of discharge. Patient was escorted out with staff to medical mall to be transported home by boyfriend.
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
## 2021-06-15 NOTE — BH IP Treatment Plan (Signed)
Interdisciplinary Treatment and Diagnostic Plan Update  06/15/2021 Time of Session: 8:30 AM [name] MRN: [id number]  Principal Diagnosis: Bipolar I disorder, most recent episode mixed, severe with psychotic features ([place])  Secondary Diagnoses: Principal Problem:   Bipolar I disorder, most recent episode mixed, severe with psychotic features ([place]) Active Problems:   Bipolar 1 disorder, mixed ([place])   H/O: hypothyroidism   Psychosis ([place])   Current Medications:  Current Facility-Administered Medications  Medication Dose Route Frequency Provider Last Rate Last Admin   acetaminophen (TYLENOL) tablet 650 mg  650 mg Oral Q6H PRN Sherlon Handing, NP       alum & mag hydroxide-simeth (MAALOX/MYLANTA) 200-200-20 MG/5ML suspension 30 mL  30 mL Oral Q4H PRN Waldon Merl F, NP       hydrOXYzine (ATARAX) tablet 25 mg  25 mg Oral Q6H PRN Waldon Merl F, NP   25 mg at 06/13/21 2108   lithium carbonate (ESKALITH) CR tablet 900 mg  900 mg Oral QHS Waldon Merl F, NP   900 mg at 06/14/21 2135   lurasidone (LATUDA) tablet 40 mg  40 mg Oral Q supper [name], [name], MD   40 mg at 06/14/21 1632   magnesium hydroxide (MILK OF MAGNESIA) suspension 30 mL  30 mL Oral Daily PRN Waldon Merl F, NP   30 mL at 06/13/21 [id number]   menthol-cetylpyridinium (CEPACOL) lozenge 3 mg  1 lozenge Oral PRN [name], [name], MD   3 mg at 06/12/21 1746   PTA Medications: Medications Prior to Admission  Medication Sig Dispense Refill Last Dose   lithium carbonate (LITHOBID) 300 MG CR tablet Take 900 mg by mouth at bedtime.   06/07/2021 at 1845   QUEtiapine (SEROQUEL) 400 MG tablet Take 400 mg by mouth at bedtime.   06/07/2021 at 1845   risperidone (RISPERDAL) 4 MG tablet Take 4 mg by mouth 2 (two) times daily.   06/08/2021 at 0600   hydrOXYzine (VISTARIL) 25 MG capsule Take 25 mg by mouth every 6 (six) hours as needed for anxiety.   prn at unknown   levothyroxine (SYNTHROID) 50 MCG tablet Take 50  mcg by mouth daily. (Patient not taking: Reported on 06/08/2021)   Not Taking   lithium carbonate (ESKALITH) 450 MG CR tablet Take 2 tablets (900 mg total) by mouth at bedtime. (Patient not taking: Reported on 06/08/2021) 60 tablet 1 Not Taking   LORazepam (ATIVAN) 1 MG tablet Take 1 mg by mouth 2 (two) times daily. (Patient not taking: Reported on 06/08/2021)   Not Taking   OLANZapine zydis (ZYPREXA) 10 MG disintegrating tablet Take 1 tablet (10 mg total) by mouth daily. (Patient not taking: Reported on 06/08/2021)   Not Taking   traZODone (DESYREL) 50 MG tablet Take 1 tablet (50 mg total) by mouth at bedtime as needed for sleep. (Patient not taking: Reported on 06/08/2021) 10 tablet 0 Not Taking    Patient Stressors: Health problems    Patient Strengths: Ability for insight  Communication skills  General fund of knowledge  Motivation for treatment/growth   Treatment Modalities: Medication Management, Group therapy, Case management,  1 to 1 session with clinician, Psychoeducation, Recreational therapy.   Physician Treatment Plan for Primary Diagnosis: Bipolar I disorder, most recent episode mixed, severe with psychotic features ([place]) Long Term Goal(s): Improvement in symptoms so as ready for discharge   Short Term Goals: Ability to maintain clinical measurements within normal limits will improve Compliance with prescribed medications will improve Ability to verbalize  feelings will improve Ability to disclose and discuss suicidal ideas Ability to demonstrate self-control will improve  Medication Management: Evaluate patient's response, side effects, and tolerance of medication regimen.  Therapeutic Interventions: 1 to 1 sessions, Unit Group sessions and Medication administration.  Evaluation of Outcomes: Adequate for Discharge  Physician Treatment Plan for Secondary Diagnosis: Principal Problem:   Bipolar I disorder, most recent episode mixed, severe with psychotic features ([place]) Active  Problems:   Bipolar 1 disorder, mixed ([place])   H/O: hypothyroidism   Psychosis ([place])  Long Term Goal(s): Improvement in symptoms so as ready for discharge   Short Term Goals: Ability to maintain clinical measurements within normal limits will improve Compliance with prescribed medications will improve Ability to verbalize feelings will improve Ability to disclose and discuss suicidal ideas Ability to demonstrate self-control will improve     Medication Management: Evaluate patient's response, side effects, and tolerance of medication regimen.  Therapeutic Interventions: 1 to 1 sessions, Unit Group sessions and Medication administration.  Evaluation of Outcomes: Adequate for Discharge   RN Treatment Plan for Primary Diagnosis: Bipolar I disorder, most recent episode mixed, severe with psychotic features ([place]) Long Term Goal(s): Knowledge of disease and therapeutic regimen to maintain health will improve  Short Term Goals: Ability to verbalize frustration and anger appropriately will improve, Ability to demonstrate self-control, Ability to participate in decision making will improve, Ability to verbalize feelings will improve, Ability to disclose and discuss suicidal ideas, Ability to identify and develop effective coping behaviors will improve, and Compliance with prescribed medications will improve  Medication Management: RN will administer medications as ordered by provider, will assess and evaluate patient's response and provide education to patient for prescribed medication. RN will report any adverse and/or side effects to prescribing provider.  Therapeutic Interventions: 1 on 1 counseling sessions, Psychoeducation, Medication administration, Evaluate responses to treatment, Monitor vital signs and CBGs as ordered, Perform/monitor CIWA, COWS, AIMS and Fall Risk screenings as ordered, Perform wound care treatments as ordered.  Evaluation of Outcomes: Adequate for Discharge   LCSW  Treatment Plan for Primary Diagnosis: Bipolar I disorder, most recent episode mixed, severe with psychotic features ([place]) Long Term Goal(s): Safe transition to appropriate next level of care at discharge, Engage patient in therapeutic group addressing interpersonal concerns.  Short Term Goals: Engage patient in aftercare planning with referrals and resources, Increase social support, Increase ability to appropriately verbalize feelings, Increase emotional regulation, Facilitate acceptance of mental health diagnosis and concerns, and Increase skills for wellness and recovery  Therapeutic Interventions: Assess for all discharge needs, 1 to 1 time with Social worker, Explore available resources and support systems, Assess for adequacy in community support network, Educate family and significant other(s) on suicide prevention, Complete Psychosocial Assessment, Interpersonal group therapy.  Evaluation of Outcomes: Adequate for Discharge   Progress in Treatment: Attending groups: Yes. Participating in groups: Yes. Taking medication as prescribed: Yes. Toleration medication: Yes. Family/Significant other contact made: Yes, individual(s) contacted:  SPE completed with the patient.  Patient understands diagnosis: Yes. Discussing patient identified problems/goals with staff: Yes. Medical problems stabilized or resolved: Yes. Denies suicidal/homicidal ideation: Yes. Issues/concerns per patient self-inventory: No. Other: none  New problem(s) identified: No, Describe:  none   New Short Term/Long Term Goal(s): Patient to work towards elimination of symptoms of psychosis, medication management for mood stabilization; elimination of SI thoughts; development of comprehensive mental wellness plan.  Update 06/15/2021:  No changes at this time.    Patient Goals:  "Make sure I am safe  from boyfriend so I can be discharged" "Make sure my kids are safe" Update 06/15/2021:  No changes at this time.    Discharge  Plan or Barriers: CSW will assist patient with discharge options. Update 06/15/2021:  No changes at this time.    Reason for Continuation of Hospitalization: Anxiety Delusions  Hallucinations   Estimated Length of Stay:1-7 days     Scribe for Treatment Team: Rozann Lesches, Marlinda Mike 06/15/2021 10:49 AM

## 2021-06-15 NOTE — BHH Suicide Risk Assessment (Signed)
[facility] Discharge Suicide Risk Assessment   Principal Problem: Bipolar I disorder, most recent episode mixed, severe with psychotic features ([place]) Discharge Diagnoses: Principal Problem:   Bipolar I disorder, most recent episode mixed, severe with psychotic features ([place]) Active Problems:   Bipolar 1 disorder, mixed ([place])   H/O: hypothyroidism   Psychosis ([place])   Total Time spent with patient: 30 minutes  Musculoskeletal: Strength & Muscle Tone: within normal limits Gait & Station: normal Patient leans: N/A  Psychiatric Specialty Exam  Presentation  General Appearance: Disheveled  Eye Contact:Fleeting  Speech:Slow  Speech Volume:Normal  Handedness:Right   Mood and Affect  Mood:Anxious; Depressed  Duration of Depression Symptoms: Less than two weeks  Affect:Blunt   Thought Process  Thought Processes:Disorganized  Descriptions of Associations:Loose  Orientation:Full (Time, Place and Person)  Thought Content:Scattered  History of Schizophrenia/Schizoaffective disorder:No  Duration of Psychotic Symptoms:No data recorded Hallucinations:No data recorded Ideas of Reference:None  Suicidal Thoughts:No data recorded Homicidal Thoughts:No data recorded  Sensorium  Memory:Immediate Fair  Judgment:Fair  Insight:Fair   Executive Functions  Concentration:Fair  Attention Span:Fair  [place]   Psychomotor Activity  Psychomotor Activity:No data recorded  Assets  Assets:Desire for Improvement; Financial Resources/Insurance; Housing; Intimacy; Resilience   Sleep  Sleep:No data recorded  Physical Exam: Physical Exam Constitutional:      Appearance: Normal appearance.  HENT:     Head: Normocephalic and atraumatic.     Mouth/Throat:     Pharynx: Oropharynx is clear.  Eyes:     Pupils: Pupils are equal, round, and reactive to light.  Cardiovascular:     Rate and Rhythm: Normal rate and regular rhythm.   Pulmonary:     Effort: Pulmonary effort is normal.     Breath sounds: Normal breath sounds.  Abdominal:     General: Abdomen is flat.     Palpations: Abdomen is soft.  Musculoskeletal:        General: Normal range of motion.  Skin:    General: Skin is warm and dry.  Neurological:     General: No focal deficit present.     Mental Status: She is alert. Mental status is at baseline.  Psychiatric:        Attention and Perception: Attention normal.        Mood and Affect: Mood normal.        Speech: Speech normal.        Behavior: Behavior is cooperative.        Thought Content: Thought content normal.        Cognition and Memory: Cognition normal.   Review of Systems  Constitutional: Negative.   HENT: Negative.    Eyes: Negative.   Respiratory: Negative.    Cardiovascular: Negative.   Gastrointestinal: Negative.   Musculoskeletal: Negative.   Skin: Negative.   Neurological: Negative.   Psychiatric/Behavioral: Negative.    Blood pressure 120/71, pulse 81, temperature 98 F (36.7 C), temperature source Oral, resp. rate 18, height 5\' 1"  (1.549 m), weight 65.8 kg, SpO2 100 %. Body mass index is 27.4 kg/m.  Mental Status Per Nursing Assessment::   On Admission:  NA  Demographic Factors:  NA  Loss Factors: Financial problems/change in socioeconomic status  Historical Factors: Impulsivity  Risk Reduction Factors:   Living with another person, especially a relative, Positive social support, and Positive therapeutic relationship  Continued Clinical Symptoms:  Bipolar Disorder:   Mixed State  Cognitive Features That Contribute To Risk:  None  Suicide Risk:  Minimal: No identifiable suicidal ideation.  Patients presenting with no risk factors but with morbid ruminations; may be classified as minimal risk based on the severity of the depressive symptoms    Plan Of Care/Follow-up recommendations:  Patient will follow up with her usual provider.  Denies any suicidal  ideation.  Medicine prescriptions provided as well as counseling about making sure she is sleeping well and staying compliant with treatment.  Patient has no suicidal thoughts or behavior.  Alethia Berthold, MD 06/15/2021, 10:20 AM

## 2021-06-15 NOTE — Progress Notes (Signed)
Recreation Therapy Notes   Date: 06/15/2021  Time: 10:45am    Location:  [facility]   Behavioral response: Appropriate  Intervention Topic:  Leisure    Discussion/Intervention:  Group content today was focused on leisure. The group defined what leisure is and some positive leisure activities they participate in. Individuals identified the difference between good and bad leisure. Participants expressed how they feel after participating in the leisure of their choice. The group discussed how they go about picking a leisure activity and if others are involved in their leisure activities. The patient stated how many leisure activities they have to choose from and reasons why it is important to have leisure time. Individuals participated in the intervention Exploration of Leisure where they had a chance to identify new leisure activities as well as benefits of leisure. Clinical Observations/Feedback: Patient came to group and was engaged and social with peers and staff. Individual participated in the intervention and was able to focus on the topic at hand.  Danae Oland LRT/CTRS         Journie Howson 06/15/2021 12:20 PM

## 2021-06-22 ENCOUNTER — Telehealth: Payer: Self-pay

## 2021-06-22 NOTE — Telephone Encounter (Signed)
went online and submitted the prior auth - pending ?

## 2021-06-22 NOTE — Telephone Encounter (Signed)
received fax requesting a prior auth on the latuda 

## 2021-06-22 NOTE — Telephone Encounter (Signed)
received fax from [facility] that latuda was approved from 06-22-21 to  06-22-24

## 2021-07-08 ENCOUNTER — Telehealth (HOSPITAL_COMMUNITY): Payer: Self-pay | Admitting: Family Medicine

## 2021-07-08 NOTE — BH Assessment (Signed)
Care Management - Follow Up [facility] Discharges  ? ?Patient has been placed in an inpatient psychiatric hospital [facility]) on 06-08-21 ?

## 2021-07-10 ENCOUNTER — Ambulatory Visit (INDEPENDENT_AMBULATORY_CARE_PROVIDER_SITE_OTHER): Payer: BC Managed Care – PPO | Admitting: Nurse Practitioner

## 2021-07-10 ENCOUNTER — Other Ambulatory Visit: Payer: Self-pay

## 2021-07-10 VITALS — BP 124/86 | HR 69 | Temp 97.2°F | Resp 14 | Ht 61.0 in | Wt 151.4 lb

## 2021-07-10 DIAGNOSIS — F3160 Bipolar disorder, current episode mixed, unspecified: Secondary | ICD-10-CM | POA: Diagnosis not present

## 2021-07-10 NOTE — Assessment & Plan Note (Signed)
Patient was admitted voluntarily twice to inpatient over the past 3 months.  FMLA for work filled out by inpatient psychiatrist.  Patient has outpatient psychiatrist that she sees virtually in therapy that she also sees.  Patient's medications were regulated and she was discharged from inpatient behavioral health.  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  Patient anticipates going back to work on 07/13/2021.  Patient seems appropriate in office and has appropriate follow-ups.  Forms filled out return to patient and copies made to scanned to chart.  Encourage patient to take medication as prescribed and follow-up with behavioral health specialist as scheduled ?

## 2021-07-10 NOTE — Patient Instructions (Signed)
Nice to see you today ?If you need anything further in regards to paperwork please let us know. ?Follow up with Dr. Ermalene Searing as needed/scheduled ?Please keep all appointments with your psychiatrist and therapists  ?
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
## 2021-10-13 ENCOUNTER — Ambulatory Visit (INDEPENDENT_AMBULATORY_CARE_PROVIDER_SITE_OTHER): Payer: BC Managed Care – PPO | Admitting: Family Medicine

## 2021-10-13 ENCOUNTER — Encounter: Payer: Self-pay | Admitting: Family Medicine

## 2021-10-13 VITALS — BP 100/70 | HR 88 | Temp 98.1°F | Ht 61.0 in | Wt 155.1 lb

## 2021-10-13 DIAGNOSIS — H6982 Other specified disorders of Eustachian tube, left ear: Secondary | ICD-10-CM

## 2021-10-13 NOTE — Assessment & Plan Note (Signed)
Acute, not improving  No sign of bacterial infection.  Treat with Flonase 2 sprays per nostril daily, decongestant as needed for 3 to 4 days.  If not improving in 2 weeks consider course of oral steroids.

## 2021-10-13 NOTE — Patient Instructions (Signed)
Start Flonase 2 sprays per nostril daily x 2 weeks.  Can try decongestant daily x 3-5 days.  Call if not improving in 2 weeks for possible prednisone taper.  Call sooner if have fever, shortness of breath.

## 2021-10-13 NOTE — Progress Notes (Signed)
Patient ID: [name], female    DOB: [birth year], 51 y.o.   MRN: [id number]  This visit was conducted in person.  BP 100/70   Pulse 88   Temp 98.1 F (36.7 C) (Oral)   Ht 5\' 1"  (1.549 m)   Wt 155 lb 1 oz (70.3 kg)   SpO2 97%   BMI 29.30 kg/m    CC:  Chief Complaint  Patient presents with   Otalgia    Left-Feels blocked and can't hear-Did E-Visit on 5/29 and was given Augmentin.       Subjective:   HPI: [name] is a 51 y.o. female presenting on 10/13/2021 for Otalgia (Left-Feels blocked and can't hear-Did E-Visit on 5/29 and was given Augmentin. //)  Hx of sinus infection 2 weeks ago..  At that time Bilateral ear pain.[name]. seen at Evisit.  Treated wit cough suppressant, amox  BID x 7 days, eye drops, reviewed OV.   Right ear improved, no further sinus pain, no fever.  No SOB, no wheeze.   Now only has left ear pressure, no pain.  ;eft ear is blocked.  Decreased hearing in left ear.   Ear issues as a child, never had eustachian tubes.        Relevant past medical, surgical, family and social history reviewed and updated as indicated. Interim medical history since our last visit reviewed. Allergies and medications reviewed and updated. Outpatient Medications Prior to Visit  Medication Sig Dispense Refill   lithium carbonate (ESKALITH) 450 MG CR tablet Take 2 tablets (900 mg total) by mouth at bedtime. 60 tablet 1   hydrOXYzine (ATARAX) 25 MG tablet Take 1 tablet (25 mg total) by mouth every 6 (six) hours as needed for anxiety. (Patient not taking: Reported on 07/10/2021) 60 tablet 1   lurasidone (LATUDA) 40 MG TABS tablet Take 1 tablet (40 mg total) by mouth daily with supper. (Patient not taking: Reported on 07/10/2021) 30 tablet 1   No facility-administered medications prior to visit.     Per HPI unless specifically indicated in ROS section below Review of Systems  Constitutional:  Negative for fatigue and fever.   HENT:  Negative for ear pain.   Eyes:  Negative for pain.  Respiratory:  Negative for chest tightness and shortness of breath.   Cardiovascular:  Negative for chest pain, palpitations and leg swelling.  Gastrointestinal:  Negative for abdominal pain.  Genitourinary:  Negative for dysuria.   Objective:  BP 100/70   Pulse 88   Temp 98.1 F (36.7 C) (Oral)   Ht 5\' 1"  (1.549 m)   Wt 155 lb 1 oz (70.3 kg)   SpO2 97%   BMI 29.30 kg/m   Wt Readings from Last 3 Encounters:  10/13/21 155 lb 1 oz (70.3 kg)  07/10/21 151 lb 6 oz (68.7 kg)  11/06/19 151 lb 12.8 oz (68.9 kg)      Physical Exam Constitutional:      General: She is not in acute distress.    Appearance: Normal appearance. She is well-developed. She is not ill-appearing or toxic-appearing.  HENT:     Head: Normocephalic.     Right Ear: Hearing, tympanic membrane, ear canal and external ear normal. No middle ear effusion. There is no impacted cerumen. Tympanic membrane is not injected, scarred, erythematous, retracted or bulging.     Left Ear: Hearing, ear canal and external ear normal. A middle ear effusion is present. There is no impacted cerumen. Tympanic membrane is  not injected, scarred, erythematous, retracted or bulging.     Nose: No mucosal edema or rhinorrhea.     Right Sinus: No maxillary sinus tenderness or frontal sinus tenderness.     Left Sinus: No maxillary sinus tenderness or frontal sinus tenderness.     Mouth/Throat:     Pharynx: Uvula midline.  Eyes:     General: Lids are normal. Lids are everted, no foreign bodies appreciated.     Conjunctiva/sclera: Conjunctivae normal.     Pupils: Pupils are equal, round, and reactive to light.  Neck:     Thyroid: No thyroid mass or thyromegaly.     Vascular: No carotid bruit.     Trachea: Trachea normal.  Cardiovascular:     Rate and Rhythm: Normal rate and regular rhythm.     Pulses: Normal pulses.     Heart sounds: Normal heart sounds, S1 normal and S2 normal.  No murmur heard.    No friction rub. No gallop.  Pulmonary:     Effort: Pulmonary effort is normal. No tachypnea or respiratory distress.     Breath sounds: Normal breath sounds. No decreased breath sounds, wheezing, rhonchi or rales.  Abdominal:     General: Bowel sounds are normal.     Palpations: Abdomen is soft.     Tenderness: There is no abdominal tenderness.  Musculoskeletal:     Cervical back: Normal range of motion and neck supple.  Skin:    General: Skin is warm and dry.     Findings: No rash.  Neurological:     Mental Status: She is alert.  Psychiatric:        Mood and Affect: Mood is not anxious or depressed.        Speech: Speech normal.        Behavior: Behavior normal. Behavior is cooperative.        Thought Content: Thought content normal.        Judgment: Judgment normal.       Results for orders placed or performed during the hospital encounter of 06/08/21  Resp Panel by RT-PCR (Flu A&B, Covid) Nasopharyngeal Swab   Specimen: Nasopharyngeal Swab; Nasopharyngeal(NP) swabs in vial transport medium  Result Value Ref Range   SARS Coronavirus 2 by RT PCR NEGATIVE NEGATIVE   Influenza A by PCR NEGATIVE NEGATIVE   Influenza B by PCR NEGATIVE NEGATIVE  Comprehensive metabolic panel  Result Value Ref Range   Sodium 137 135 - 145 mmol/L   Potassium 4.1 3.5 - 5.1 mmol/L   Chloride 103 98 - 111 mmol/L   CO2 27 22 - 32 mmol/L   Glucose, Bld 110 (H) 70 - 99 mg/dL   BUN 16 6 - 20 mg/dL   Creatinine, Ser 9.70 0.44 - 1.00 mg/dL   Calcium 9.2 8.9 - 26.3 mg/dL   Total Protein 6.7 6.5 - 8.1 g/dL   Albumin 3.2 (L) 3.5 - 5.0 g/dL   AST 27 15 - 41 U/L   ALT 29 0 - 44 U/L   Alkaline Phosphatase 75 38 - 126 U/L   Total Bilirubin 0.4 0.3 - 1.2 mg/dL   GFR, Estimated >78 >58 mL/min   Anion gap 7 5 - 15  Ethanol  Result Value Ref Range   Alcohol, Ethyl (B) <10 <10 mg/dL  Salicylate level  Result Value Ref Range   Salicylate Lvl <7.0 (L) 7.0 - 30.0 mg/dL  Acetaminophen  level  Result Value Ref Range   Acetaminophen (Tylenol), Serum <10 (L) 10 -  30 ug/mL  cbc  Result Value Ref Range   WBC 10.1 4.0 - 10.5 K/uL   RBC 4.15 3.87 - 5.11 MIL/uL   Hemoglobin 12.3 12.0 - 15.0 g/dL   HCT 17.4 94.4 - 96.7 %   MCV 92.3 80.0 - 100.0 fL   MCH 29.6 26.0 - 34.0 pg   MCHC 32.1 30.0 - 36.0 g/dL   RDW 59.1 63.8 - 46.6 %   Platelets 350 150 - 400 K/uL   nRBC 0.0 0.0 - 0.2 %  Urine Drug Screen, Qualitative  Result Value Ref Range   Tricyclic, Ur Screen POSITIVE (A) NONE DETECTED   Amphetamines, Ur Screen NONE DETECTED NONE DETECTED   MDMA (Ecstasy)Ur Screen NONE DETECTED NONE DETECTED   Cocaine Metabolite,Ur [place] NONE DETECTED NONE DETECTED   Opiate, Ur Screen NONE DETECTED NONE DETECTED   Phencyclidine (PCP) Ur S NONE DETECTED NONE DETECTED   Cannabinoid 50 Ng, Ur [place] NONE DETECTED NONE DETECTED   Barbiturates, Ur Screen NONE DETECTED NONE DETECTED   Benzodiazepine, Ur Scrn POSITIVE (A) NONE DETECTED   Methadone Scn, Ur NONE DETECTED NONE DETECTED  CK  Result Value Ref Range   Total CK 108 38 - 234 U/L  Lithium level  Result Value Ref Range   Lithium Lvl 0.37 (L) 0.60 - 1.20 mmol/L  TSH  Result Value Ref Range   TSH 2.017 0.350 - 4.500 uIU/mL  POC urine preg, ED  Result Value Ref Range   Preg Test, Ur NEGATIVE NEGATIVE     COVID 19 screen:  No recent travel or known exposure to COVID19 The patient denies respiratory symptoms of COVID 19 at this time. The importance of social distancing was discussed today.   Assessment and Plan    Problem List Items Addressed This Visit     ETD (Eustachian tube dysfunction), left - Primary    Acute, not improving  No sign of bacterial infection.  Treat with Flonase 2 sprays per nostril daily, decongestant as needed for 3 to 4 days.  If not improving in 2 weeks consider course of oral steroids.        Kerby Nora, MD

## 2021-11-26 ENCOUNTER — Telehealth: Payer: Self-pay | Admitting: Family Medicine

## 2021-11-26 NOTE — Telephone Encounter (Signed)
Patient called in and stated that she think he was bitten on her hand by a spider. She has two bite marks that look pus filled,and looks like two pimples. Patient was triaged.

## 2021-11-27 NOTE — Telephone Encounter (Signed)
[facility] - Client TELEPHONE ADVICE RECORD AccessNurse Patient Name: [name] Gender: Female DOB: Oct 23, 1970 Age: 51 Y 7 M 27 D Return Phone Number: [phone] (Primary) Address: City/ State/ Zip: [place] [address] Client [facility] - Client Client Site [facility] - Day Provider Kerby Nora - MD Contact Type Call Who Is Calling Patient / Member / Family / Caregiver Call Type Triage / Clinical Relationship To Patient Self Return Phone Number [phone] (Primary) Chief Complaint Spider Bite Reason for Call Symptomatic / Request for Health Information Initial Comment Caller is Tresa Endo from [employer] calling about a Pt with a spider bite on her hand. Translation No Nurse Assessment Nurse: Kizzie Bane, RN, Marylene Land Date/Time (Eastern Time): 11/26/2021 3:37:45 PM Confirm and document reason for call. If symptomatic, describe symptoms. ---Caller states she has a bite on her right hand that came up this morning. It is itchy and reddish Does the patient have any new or worsening symptoms? ---Yes Will a triage be completed? ---Yes Related visit to physician within the last 2 weeks? ---No Does the PT have any chronic conditions? (i.e. diabetes, asthma, this includes High risk factors for pregnancy, etc.) ---No Is the patient pregnant or possibly pregnant? (Ask all females between the ages of 48-55) ---No Is this a behavioral health or substance abuse call? ---No Guidelines Guideline Title Affirmed Question Affirmed Notes Nurse Date/Time Lamount Cohen Time) Insect Bite Itchy insect bite Leanord Hawking 11/26/2021 3:38:53 PM Disp. Time Lamount Cohen Time) Disposition Final User 11/26/2021 3:43:02 PM Home Care Yes Kizzie Bane, RN, Marylene Land Final Disposition 11/26/2021 3:43:02 PM Home Care Yes Kizzie Bane, RN, Marylene Land PLEASE NOTE: All timestamps contained within this report are represented as  [place] Standard Time. CONFIDENTIALTY NOTICE: This fax transmission is intended only for the addressee. It contains information that is legally privileged, confidential or otherwise protected from use or disclosure. If you are not the intended recipient, you are strictly prohibited from reviewing, disclosing, copying using or disseminating any of this information or taking any action in reliance on or regarding this information. If you have received this fax in error, please notify us immediately by telephone so that we can arrange for its return to [place]. Phone: [phone], Toll-Free: [phone], Fax: [phone] Page: 2 of 2 Call Id: [id number] Caller Disagree/Comply Comply Caller Understands Yes PreDisposition Did not know what to do Care Advice Given Per Guideline HOME CARE: * You should be able to treat this at home. * The most common symptom of an insect bite is LOCAL SKIN REACTION with a SMALL RED BUMP. THERE MAY BE many small red bumps if you were bitten multiple times. Other symptoms are: ITCHING, PAIN, small area of localized hives (a welt or slight swelling), or small area of localized redness. Sometimes a tiny water blister forms in the center of the insect bite. FOUR SIMPLE HOME REMEDIES FOR ITCHY INSECT BITES: * Rub bite(s) with an ice cube for 30 seconds. * Try putting firm, direct, steady pressure to a bite for 10 seconds. A fingernail, pen cap, or other object can be used. * Apply calamine lotion to the bite(s). * Make baking soda paste by mixing baking soda with a small amount of water. Apply the paste to the bite(s). DON'T SCRATCH: * Try not to scratch. * Scratching makes the itching worse (the 'Itch-Scratch' cycle). * Cut your fingernails short. Wash hands frequently with an antibacterial soap. This will help prevent a bacterial skin infection.  HYDROCORTISONE CREAM FOR SEVERE ITCHING: * You can use hydrocortisone for severe itching. * Put 1% hydrocortisone cream on the itchy  area(s) 3 times a day. Use it for a couple days, until it feels better. This will help decrease the itching. * This is an over-the-counter (OTC) drug. You can buy it at the drugstore. * Some people like to keep the cream in the refrigerator. It feels even better if the cream is used when it is cold. CALL BACK IF: * Severe pain persists over 2 hours after pain medicine * Bite looks infected (pus, red streaks, increased tenderness) * Bite has not healed after 14 days * Redness getting larger and more than 48 hours after the bite * You become worse CARE ADVICE given per Insect Bite (Adult) guideline

## 2021-12-26 ENCOUNTER — Telehealth: Payer: Self-pay | Admitting: Family Medicine

## 2021-12-26 DIAGNOSIS — Z13220 Encounter for screening for lipoid disorders: Secondary | ICD-10-CM

## 2021-12-26 DIAGNOSIS — Z131 Encounter for screening for diabetes mellitus: Secondary | ICD-10-CM

## 2021-12-26 DIAGNOSIS — Z1159 Encounter for screening for other viral diseases: Secondary | ICD-10-CM

## 2021-12-26 NOTE — Telephone Encounter (Signed)
-----   Message from Terri J Walsh sent at 12/23/2021  3:23 PM EDT ----- Regarding: Lab orders for Friday, 9.8.23 Patient is scheduled for CPX labs, please order future labs, Thanks , Terri   

## 2022-01-08 ENCOUNTER — Other Ambulatory Visit (INDEPENDENT_AMBULATORY_CARE_PROVIDER_SITE_OTHER): Payer: BC Managed Care – PPO

## 2022-01-08 DIAGNOSIS — Z131 Encounter for screening for diabetes mellitus: Secondary | ICD-10-CM | POA: Diagnosis not present

## 2022-01-08 DIAGNOSIS — Z13220 Encounter for screening for lipoid disorders: Secondary | ICD-10-CM

## 2022-01-08 DIAGNOSIS — Z1159 Encounter for screening for other viral diseases: Secondary | ICD-10-CM

## 2022-01-08 LAB — COMPREHENSIVE METABOLIC PANEL
ALT: 15 U/L (ref 0–35)
AST: 15 U/L (ref 0–37)
Albumin: 4 g/dL (ref 3.5–5.2)
Alkaline Phosphatase: 60 U/L (ref 39–117)
BUN: 23 mg/dL (ref 6–23)
CO2: 24 mEq/L (ref 19–32)
Calcium: 9.4 mg/dL (ref 8.4–10.5)
Chloride: 103 mEq/L (ref 96–112)
Creatinine, Ser: 0.79 mg/dL (ref 0.40–1.20)
GFR: 87.62 mL/min (ref >60.00)
Glucose, Bld: 105 mg/dL — ABNORMAL HIGH (ref 70–99)
Potassium: 4 mEq/L (ref 3.5–5.1)
Sodium: 136 mEq/L (ref 135–145)
Total Bilirubin: 0.4 mg/dL (ref 0.2–1.2)
Total Protein: 7 g/dL (ref 6.0–8.3)

## 2022-01-08 LAB — LIPID PANEL
Cholesterol: 249 mg/dL — ABNORMAL HIGH (ref 0–200)
HDL: 50.5 mg/dL (ref >39.00)
LDL Cholesterol: 185 mg/dL — ABNORMAL HIGH (ref 0–99)
NonHDL: 198.62
Total CHOL/HDL Ratio: 5
Triglycerides: 70 mg/dL (ref 0.0–149.0)
VLDL: 14 mg/dL (ref 0.0–40.0)

## 2022-01-08 LAB — HEMOGLOBIN A1C: Hgb A1c MFr Bld: 5.7 % (ref 4.6–6.5)

## 2022-01-08 NOTE — Progress Notes (Signed)
No critical labs need to be addressed urgently. We will discuss labs in detail at upcoming office visit.   

## 2022-01-11 LAB — HEPATITIS C ANTIBODY: Hepatitis C Ab: NONREACTIVE

## 2022-01-12 NOTE — Progress Notes (Signed)
No critical labs need to be addressed urgently. We will discuss labs in detail at upcoming office visit.   

## 2022-01-15 ENCOUNTER — Ambulatory Visit (INDEPENDENT_AMBULATORY_CARE_PROVIDER_SITE_OTHER): Payer: BC Managed Care – PPO | Admitting: Family Medicine

## 2022-01-15 ENCOUNTER — Encounter: Payer: Self-pay | Admitting: Family Medicine

## 2022-01-15 VITALS — BP 110/70 | HR 86 | Temp 98.3°F | Ht 59.75 in | Wt 153.5 lb

## 2022-01-15 DIAGNOSIS — Z0000 Encounter for general adult medical examination without abnormal findings: Secondary | ICD-10-CM | POA: Diagnosis not present

## 2022-01-15 DIAGNOSIS — F3160 Bipolar disorder, current episode mixed, unspecified: Secondary | ICD-10-CM

## 2022-01-15 DIAGNOSIS — Z1211 Encounter for screening for malignant neoplasm of colon: Secondary | ICD-10-CM | POA: Diagnosis not present

## 2022-01-15 DIAGNOSIS — F3110 Bipolar disorder, current episode manic without psychotic features, unspecified: Secondary | ICD-10-CM

## 2022-01-15 DIAGNOSIS — Z23 Encounter for immunization: Secondary | ICD-10-CM

## 2022-01-15 NOTE — Patient Instructions (Addendum)
Work on regular exercise and low carb diet. Please call the location of your choice from the menu below to schedule your Mammogram and/or Bone Density appointment.    [facility]   [facility]                      Phone:  [phone] [address]                               [place], [place] [address]                                                             Services: Traditional and 3D Mammogram, Bone Density   [facility]                 Phone: [phone] [address]                                                       [place], [place] [address]    Service: Bone Density ONLY   *this site does NOT perform mammograms  [facility]                        Phone:  [phone] [address]                                  [place], [place] [address]                                            Services:  3D Mammogram and Bone Density    [facility]   Phone:  [phone]   [address]                                                                            [place], [place] [address]                                            Services: 3D Mammogram and Bone [facility])  Phone:  [phone]   [address]                        [place], [place] [address]  Services:  3D Mammogram and Bone Density  

## 2022-01-15 NOTE — Assessment & Plan Note (Signed)
Chronic, stable control on lithium 450 mg 2 tablets by mouth at bedtime.  Followed by psychiatry.

## 2022-01-15 NOTE — Progress Notes (Signed)
Patient ID: Cathlyn Tersigni, female    DOB: 1971/03/29, 51 y.o.   MRN: [id number]  This visit was conducted in person.  BP 110/70   Pulse 86   Temp 98.3 F (36.8 C) (Oral)   Ht 4' 11.75" (1.518 m)   Wt 153 lb 8 oz (69.6 kg)   SpO2 96%   BMI 30.23 kg/m    CC:  Chief Complaint  Patient presents with   Annual Exam    Subjective:   HPI: Jennifr Gaeta is a 51 y.o. female presenting on 01/15/2022 for Annual Exam   Having intermittent low back pain with sciatica.  Started after returning to work job of sitting more often.  Reviewed labs in detail  Diet:  moderate Exercise: zumba 3-5 days a week.  Wt Readings from Last 3 Encounters:  01/15/22 153 lb 8 oz (69.6 kg)  10/13/21 155 lb 1 oz (70.3 kg)  07/10/21 151 lb 6 oz (68.7 kg)    Lab Results  Component Value Date   CHOL 249 (H) 01/08/2022   HDL 50.50 01/08/2022   LDLCALC 185 (H) 01/08/2022   LDLDIRECT 141.2 03/02/2010   TRIG 70.0 01/08/2022   CHOLHDL 5 01/08/2022   The 10-year ASCVD risk score (Arnett DK, et al., 2019) is: 1.3%   Values used to calculate the score:     Age: 71 years     Sex: Female     Is Non-Hispanic African American: No     Diabetic: No     Tobacco smoker: No     Systolic Blood Pressure: 110 mmHg     Is BP treated: No     HDL Cholesterol: 50.5 mg/dL     Total Cholesterol: 249 mg/dL      Relevant past medical, surgical, family and social history reviewed and updated as indicated. Interim medical history since our last visit reviewed. Allergies and medications reviewed and updated. Outpatient Medications Prior to Visit  Medication Sig Dispense Refill   lithium carbonate (ESKALITH) 450 MG CR tablet Take 2 tablets (900 mg total) by mouth at bedtime. 60 tablet 1   No facility-administered medications prior to visit.     Per HPI unless specifically indicated in ROS section below Review of Systems  Constitutional:  Negative for fatigue and fever.  HENT:   Negative for congestion.   Eyes:  Negative for pain.  Respiratory:  Negative for cough and shortness of breath.   Cardiovascular:  Negative for chest pain, palpitations and leg swelling.  Gastrointestinal:  Negative for abdominal pain.  Genitourinary:  Negative for dysuria and vaginal bleeding.  Musculoskeletal:  Negative for back pain.  Neurological:  Negative for syncope, light-headedness and headaches.  Psychiatric/Behavioral:  Negative for dysphoric mood.    Objective:  BP 110/70   Pulse 86   Temp 98.3 F (36.8 C) (Oral)   Ht 4' 11.75" (1.518 m)   Wt 153 lb 8 oz (69.6 kg)   SpO2 96%   BMI 30.23 kg/m   Wt Readings from Last 3 Encounters:  01/15/22 153 lb 8 oz (69.6 kg)  10/13/21 155 lb 1 oz (70.3 kg)  07/10/21 151 lb 6 oz (68.7 kg)      Physical Exam Constitutional:      General: She is not in acute distress.    Appearance: Normal appearance. She is well-developed. She is not ill-appearing or toxic-appearing.  HENT:     Head: Normocephalic.     Right Ear: Hearing, tympanic membrane, ear canal  and external ear normal.     Left Ear: Hearing, tympanic membrane, ear canal and external ear normal.     Nose: Nose normal.  Eyes:     General: Lids are normal. Lids are everted, no foreign bodies appreciated.     Conjunctiva/sclera: Conjunctivae normal.     Pupils: Pupils are equal, round, and reactive to light.  Neck:     Thyroid: No thyroid mass or thyromegaly.     Vascular: No carotid bruit.     Trachea: Trachea normal.  Cardiovascular:     Rate and Rhythm: Normal rate and regular rhythm.     Heart sounds: Normal heart sounds, S1 normal and S2 normal. No murmur heard.    No gallop.  Pulmonary:     Effort: Pulmonary effort is normal. No respiratory distress.     Breath sounds: Normal breath sounds. No wheezing, rhonchi or rales.  Abdominal:     General: Bowel sounds are normal. There is no distension or abdominal bruit.     Palpations: Abdomen is soft. There is no fluid  wave or mass.     Tenderness: There is no abdominal tenderness. There is no guarding or rebound.     Hernia: No hernia is present.  Musculoskeletal:     Cervical back: Normal range of motion and neck supple.  Lymphadenopathy:     Cervical: No cervical adenopathy.  Skin:    General: Skin is warm and dry.     Findings: No rash.  Neurological:     Mental Status: She is alert.     Cranial Nerves: No cranial nerve deficit.     Sensory: No sensory deficit.  Psychiatric:        Mood and Affect: Mood is not anxious or depressed.        Speech: Speech normal.        Behavior: Behavior normal. Behavior is cooperative.        Judgment: Judgment normal.       Results for orders placed or performed in visit on 01/08/22  Hemoglobin A1c  Result Value Ref Range   Hgb A1c MFr Bld 5.7 4.6 - 6.5 %  Hepatitis C antibody  Result Value Ref Range   Hepatitis C Ab NON-REACTIVE NON-REACTIVE  Comprehensive metabolic panel  Result Value Ref Range   Sodium 136 135 - 145 mEq/L   Potassium 4.0 3.5 - 5.1 mEq/L   Chloride 103 96 - 112 mEq/L   CO2 24 19 - 32 mEq/L   Glucose, Bld 105 (H) 70 - 99 mg/dL   BUN 23 6 - 23 mg/dL   Creatinine, Ser 5.95 0.40 - 1.20 mg/dL   Total Bilirubin 0.4 0.2 - 1.2 mg/dL   Alkaline Phosphatase 60 39 - 117 U/L   AST 15 0 - 37 U/L   ALT 15 0 - 35 U/L   Total Protein 7.0 6.0 - 8.3 g/dL   Albumin 4.0 3.5 - 5.2 g/dL   GFR 63.87 >56.43 mL/min   Calcium 9.4 8.4 - 10.5 mg/dL  Lipid panel  Result Value Ref Range   Cholesterol 249 (H) 0 - 200 mg/dL   Triglycerides 32.9 0.0 - 149.0 mg/dL   HDL 51.88 >41.66 mg/dL   VLDL 06.3 0.0 - 01.6 mg/dL   LDL Cholesterol 010 (H) 0 - 99 mg/dL   Total CHOL/HDL Ratio 5    NonHDL 198.62      COVID 19 screen:  No recent travel or known exposure to COVID19 The patient denies  respiratory symptoms of COVID 19 at this time. The importance of social distancing was discussed today.   Assessment and Plan The patient's preventative maintenance  and recommended screening tests for an annual wellness exam were reviewed in full today. Brought up to date unless services declined.  Counselled on the importance of diet, exercise, and its role in overall health and mortality. The patient's FH and SH was reviewed, including their home life, tobacco status, and drug and alcohol status.    Vaccines: given Tdap and flu Pap/DVE:  2020, repeat in 5 years Mammo:   due. Colon: no colon cancer in family... ifob yearl Smoking Status: former smoker, remotely minimal ETOH/ drug use: rare ETOH socially/none  HIV screen:   refused   Problem List Items Addressed This Visit     RESOLVED: Bipolar 1 disorder, mixed (HCC)   Bipolar I disorder, most recent episode (or current) manic (HCC)    Chronic, stable control on lithium 450 mg 2 tablets by mouth at bedtime.  Followed by psychiatry.      Other Visit Diagnoses     Routine general medical examination at a health care facility    -  Primary   Need for influenza vaccination       Relevant Orders   Flu Vaccine QUAD 6+ mos PF IM (Fluarix Quad PF) (Completed)   Need for Tdap vaccination       Relevant Orders   Tdap vaccine greater than or equal to 7yo IM (Completed)   Colon cancer screening       Relevant Orders   Fecal occult blood, imunochemical        Kerby Nora, MD

## 2022-01-25 ENCOUNTER — Other Ambulatory Visit (INDEPENDENT_AMBULATORY_CARE_PROVIDER_SITE_OTHER): Payer: BC Managed Care – PPO | Admitting: Radiology

## 2022-01-25 DIAGNOSIS — Z1211 Encounter for screening for malignant neoplasm of colon: Secondary | ICD-10-CM

## 2022-01-27 ENCOUNTER — Telehealth: Payer: Self-pay

## 2022-01-27 LAB — FECAL OCCULT BLOOD, IMMUNOCHEMICAL: Fecal Occult Bld: POSITIVE — AB

## 2022-01-27 NOTE — Telephone Encounter (Signed)
Received call from Lab from [facility], Ifob positive.  Dr Diona Browner advised. Documented in the red book.

## 2022-02-01 ENCOUNTER — Encounter: Payer: Self-pay | Admitting: Family Medicine

## 2022-02-01 DIAGNOSIS — Z1211 Encounter for screening for malignant neoplasm of colon: Secondary | ICD-10-CM

## 2022-06-10 ENCOUNTER — Ambulatory Visit: Payer: BC Managed Care – PPO | Admitting: Family Medicine

## 2022-06-10 ENCOUNTER — Encounter: Payer: Self-pay | Admitting: Family Medicine

## 2022-06-10 VITALS — BP 110/72 | HR 84 | Temp 97.7°F | Ht 59.75 in | Wt 165.5 lb

## 2022-06-10 DIAGNOSIS — M5442 Lumbago with sciatica, left side: Secondary | ICD-10-CM | POA: Diagnosis not present

## 2022-06-10 MED ORDER — DICLOFENAC SODIUM 75 MG PO TBEC: 75.0000 mg | DELAYED_RELEASE_TABLET | Freq: Two times a day (BID) | ORAL | 0 refills | Status: DC

## 2022-06-10 NOTE — Assessment & Plan Note (Signed)
Acute flare of chronic back issue.  Most consistent with sciatica although exam today is unremarkable.  Recommend heat and starting gentle low back stretching.  She will start diclofenac 75 mg twice daily for pain and inflammation.  Of note this does make lithium concentrations increase so she needs to watch for lithium toxicity while on this for the next 1 to 2 weeks. Referral placed for physical therapy.  Return and ER precautions provided

## 2022-06-10 NOTE — Progress Notes (Signed)
Patient ID: [name], female    DOB: [birth year], 52 y.o.   MRN: [id number]  This visit was conducted in person.  BP 110/72   Pulse 84   Temp 97.7 F (36.5 C) (Temporal)   Ht 4' 11.75" (1.518 m)   Wt 165 lb 8 oz (75.1 kg)   SpO2 95%   BMI 32.59 kg/m    CC:  Chief Complaint  Patient presents with   Sciatica    Chiropractor told her she has a pinched nerve Requesting PT    Subjective:   HPI: [name] is a 52 y.o. female presenting on 06/10/2022 for Sciatica (Chiropractor told her she has a pinched nerve/Requesting PT)   New onset pain in left entire leg. Ongoing for  several years now  worse 3 weeks  Pain  starts in  left buttock and radiates to left lower leg.  Occ pins and needles in left lower leg.   No weakness.  No fever.   No perineal weakness.  No incontinence.    Has made her unable to do Zumba. She can only walk or stand for 5 minutes.    Has  been seeing a Restaurant manager, fast food. Xrays showed DDD.   Using aspirin off and on.  Does NOT keep her up at night.       Relevant past medical, surgical, family and social history reviewed and updated as indicated. Interim medical history since our last visit reviewed. Allergies and medications reviewed and updated. Outpatient Medications Prior to Visit  Medication Sig Dispense Refill   lithium carbonate (ESKALITH) 450 MG CR tablet Take 2 tablets (900 mg total) by mouth at bedtime. 60 tablet 1   No facility-administered medications prior to visit.     Per HPI unless specifically indicated in ROS section below Review of Systems  Constitutional:  Negative for fatigue and fever.  HENT:  Negative for congestion.   Eyes:  Negative for pain.  Respiratory:  Negative for cough and shortness of breath.   Cardiovascular:  Negative for chest pain, palpitations and leg swelling.  Gastrointestinal:  Negative for abdominal pain.  Genitourinary:  Negative for dysuria and vaginal bleeding.   Musculoskeletal:  Negative for back pain.  Neurological:  Negative for syncope, light-headedness and headaches.  Psychiatric/Behavioral:  Negative for dysphoric mood.    Objective:  BP 110/72   Pulse 84   Temp 97.7 F (36.5 C) (Temporal)   Ht 4' 11.75" (1.518 m)   Wt 165 lb 8 oz (75.1 kg)   SpO2 95%   BMI 32.59 kg/m   Wt Readings from Last 3 Encounters:  06/10/22 165 lb 8 oz (75.1 kg)  01/15/22 153 lb 8 oz (69.6 kg)  10/13/21 155 lb 1 oz (70.3 kg)      Physical Exam Constitutional:      General: She is not in acute distress.    Appearance: Normal appearance. She is well-developed. She is not ill-appearing or toxic-appearing.  HENT:     Head: Normocephalic.     Right Ear: Hearing, tympanic membrane, ear canal and external ear normal. Tympanic membrane is not erythematous, retracted or bulging.     Left Ear: Hearing, tympanic membrane, ear canal and external ear normal. Tympanic membrane is not erythematous, retracted or bulging.     Nose: No mucosal edema or rhinorrhea.     Right Sinus: No maxillary sinus tenderness or frontal sinus tenderness.     Left Sinus: No maxillary sinus tenderness or frontal sinus tenderness.  Mouth/Throat:     Pharynx: Uvula midline.  Eyes:     General: Lids are normal. Lids are everted, no foreign bodies appreciated.     Conjunctiva/sclera: Conjunctivae normal.     Pupils: Pupils are equal, round, and reactive to light.  Neck:     Thyroid: No thyroid mass or thyromegaly.     Vascular: No carotid bruit.     Trachea: Trachea normal.  Cardiovascular:     Rate and Rhythm: Normal rate and regular rhythm.     Pulses: Normal pulses.     Heart sounds: Normal heart sounds, S1 normal and S2 normal. No murmur heard.    No friction rub. No gallop.  Pulmonary:     Effort: Pulmonary effort is normal. No tachypnea or respiratory distress.     Breath sounds: Normal breath sounds. No decreased breath sounds, wheezing, rhonchi or rales.  Abdominal:      General: Bowel sounds are normal.     Palpations: Abdomen is soft.     Tenderness: There is no abdominal tenderness.  Musculoskeletal:     Cervical back: Normal, normal range of motion and neck supple.     Thoracic back: Normal.     Lumbar back: No tenderness or bony tenderness. Normal range of motion. Negative right straight leg raise test and negative left straight leg raise test.  Skin:    General: Skin is warm and dry.     Findings: No rash.  Neurological:     Mental Status: She is alert and oriented to person, place, and time.     GCS: GCS eye subscore is 4. GCS verbal subscore is 5. GCS motor subscore is 6.     Cranial Nerves: No cranial nerve deficit.     Sensory: No sensory deficit.     Motor: No abnormal muscle tone.     Coordination: Coordination normal.     Gait: Gait normal.     Deep Tendon Reflexes: Reflexes are normal and symmetric.     Comments: Nml cerebellar exam   No papilledema  Psychiatric:        Mood and Affect: Mood is not anxious or depressed.        Speech: Speech normal.        Behavior: Behavior normal. Behavior is cooperative.        Thought Content: Thought content normal.        Cognition and Memory: Memory is not impaired. She does not exhibit impaired recent memory or impaired remote memory.        Judgment: Judgment normal.       Results for orders placed or performed in visit on 01/25/22  Fecal occult blood, imunochemical   Specimen: Stool  Result Value Ref Range   Fecal Occult Bld Positive (A) Negative    Assessment and Plan  Acute left-sided low back pain with left-sided sciatica Assessment & Plan: Acute flare of chronic back issue.  Most consistent with sciatica although exam today is unremarkable.  Recommend heat and starting gentle low back stretching.  She will start diclofenac 75 mg twice daily for pain and inflammation.  Of note this does make lithium concentrations increase so she needs to watch for lithium toxicity while on this  for the next 1 to 2 weeks. Referral placed for physical therapy.  Return and ER precautions provided  Orders: -     Ambulatory referral to Physical Therapy  Other orders -     Diclofenac Sodium; Take 1 tablet (75  mg total) by mouth 2 (two) times daily. Watch for lithium toxicity while on  diclofenac for next 1-2 weeks.  Dispense: 30 tablet; Refill: 0    Return if symptoms worsen or fail to improve.   Eliezer Lofts, MD

## 2022-08-03 ENCOUNTER — Ambulatory Visit (INDEPENDENT_AMBULATORY_CARE_PROVIDER_SITE_OTHER): Payer: BC Managed Care – PPO

## 2022-08-03 DIAGNOSIS — D125 Benign neoplasm of sigmoid colon: Secondary | ICD-10-CM | POA: Diagnosis not present

## 2022-08-03 DIAGNOSIS — K640 First degree hemorrhoids: Secondary | ICD-10-CM | POA: Diagnosis not present

## 2022-08-03 DIAGNOSIS — Z1211 Encounter for screening for malignant neoplasm of colon: Secondary | ICD-10-CM | POA: Diagnosis present

## 2022-08-03 DIAGNOSIS — R195 Other fecal abnormalities: Secondary | ICD-10-CM | POA: Diagnosis not present

## 2022-08-05 LAB — HM MAMMOGRAPHY

## 2022-08-10 ENCOUNTER — Ambulatory Visit: Payer: BC Managed Care – PPO | Admitting: Family Medicine

## 2022-08-12 ENCOUNTER — Ambulatory Visit: Payer: BC Managed Care – PPO | Admitting: Family Medicine

## 2022-08-12 VITALS — BP 110/80 | HR 77 | Temp 97.9°F | Ht 59.75 in | Wt 164.5 lb

## 2022-08-12 DIAGNOSIS — M5442 Lumbago with sciatica, left side: Secondary | ICD-10-CM | POA: Diagnosis not present

## 2022-08-12 MED ORDER — PREDNISONE 20 MG PO TABS: ORAL_TABLET | ORAL | 0 refills | Status: DC

## 2022-08-12 NOTE — Patient Instructions (Addendum)
Continue PT.  Complete a course of prednisone taper.. call or MYChart with update early next week.  If no improvement with move forward with MRI and possible referral to [facility] back specialist.

## 2022-08-12 NOTE — Progress Notes (Signed)
Patient ID: [name], female    DOB: 1971-03-24, 52 y.o.   MRN: [id number]  This visit was conducted in person.  BP 110/80   Pulse 77   Temp 97.9 F (36.6 C) (Temporal)   Ht 4' 11.75" (1.518 m)   Wt 164 lb 8 oz (74.6 kg)   SpO2 98%   BMI 32.40 kg/m    CC:  ?  Subjective:   HPI: [name] is a 52 y.o. female presenting on 08/12/2022 for Discussion    She presents today to discuss her colonoscopy report and her mammogram report.  These were reviewed in detail. Patient was called for urinary Szymon scheduled for an appointment.  She states she did not initiate the appointment  08/05/22 Screening mammogram reviewed without abnormality, next repeat in 1 year.   08/05/22 Colonoscopy showed 8 mm polyp, non bleeding internal hemorrhoids.  Pathology has not returned yet.   She continues to have issues with left hip/ buttocks.  Reviewed last OV from 06/10/22  Diclofenac x 2 weeks unhelpful.  Naproxen once daily... not helpful.  Has been seeing chiropractor.  At last OV referred to PT... no better with first 3-4 seesions.   Pain now ongoing years, but worse in last 3 months.  Now daily pain.[name] interfering with activity.. Has made her unable to do Zumba. She can only walk or stand for 5 minutes    Plain X-ray showing DDD at chiropractor.           Relevant past medical, surgical, family and social history reviewed and updated as indicated. Interim medical history since our last visit reviewed. Allergies and medications reviewed and updated. Outpatient Medications Prior to Visit  Medication Sig Dispense Refill   lithium carbonate (ESKALITH) 450 MG CR tablet Take 2 tablets (900 mg total) by mouth at bedtime. 60 tablet 1   diclofenac (VOLTAREN) 75 MG EC tablet Take 1 tablet (75 mg total) by mouth 2 (two) times daily. Watch for lithium toxicity while on  diclofenac for next 1-2 weeks. 30 tablet 0   No facility-administered medications  prior to visit.     Per HPI unless specifically indicated in ROS section below Review of Systems  Constitutional:  Negative for fatigue and fever.  HENT:  Negative for congestion.   Eyes:  Negative for pain.  Respiratory:  Negative for cough and shortness of breath.   Cardiovascular:  Negative for chest pain, palpitations and leg swelling.  Gastrointestinal:  Negative for abdominal pain.  Genitourinary:  Negative for dysuria and vaginal bleeding.  Musculoskeletal:  Negative for back pain.  Neurological:  Negative for syncope, light-headedness and headaches.  Psychiatric/Behavioral:  Negative for dysphoric mood.    Objective:  BP 110/80   Pulse 77   Temp 97.9 F (36.6 C) (Temporal)   Ht 4' 11.75" (1.518 m)   Wt 164 lb 8 oz (74.6 kg)   SpO2 98%   BMI 32.40 kg/m   Wt Readings from Last 3 Encounters:  08/12/22 164 lb 8 oz (74.6 kg)  06/10/22 165 lb 8 oz (75.1 kg)  01/15/22 153 lb 8 oz (69.6 kg)      Physical Exam Constitutional:      General: She is not in acute distress.    Appearance: Normal appearance. She is well-developed. She is not ill-appearing or toxic-appearing.  HENT:     Head: Normocephalic.     Right Ear: Hearing, tympanic membrane, ear canal and external ear normal. Tympanic membrane is not  erythematous, retracted or bulging.     Left Ear: Hearing, tympanic membrane, ear canal and external ear normal. Tympanic membrane is not erythematous, retracted or bulging.     Nose: No mucosal edema or rhinorrhea.     Right Sinus: No maxillary sinus tenderness or frontal sinus tenderness.     Left Sinus: No maxillary sinus tenderness or frontal sinus tenderness.     Mouth/Throat:     Pharynx: Uvula midline.  Eyes:     General: Lids are normal. Lids are everted, no foreign bodies appreciated.     Conjunctiva/sclera: Conjunctivae normal.     Pupils: Pupils are equal, round, and reactive to light.  Neck:     Thyroid: No thyroid mass or thyromegaly.     Vascular: No  carotid bruit.     Trachea: Trachea normal.  Cardiovascular:     Rate and Rhythm: Normal rate and regular rhythm.     Pulses: Normal pulses.     Heart sounds: Normal heart sounds, S1 normal and S2 normal. No murmur heard.    No friction rub. No gallop.  Pulmonary:     Effort: Pulmonary effort is normal. No tachypnea or respiratory distress.     Breath sounds: Normal breath sounds. No decreased breath sounds, wheezing, rhonchi or rales.  Abdominal:     General: Bowel sounds are normal.     Palpations: Abdomen is soft.     Tenderness: There is no abdominal tenderness.  Musculoskeletal:     Cervical back: Normal, normal range of motion and neck supple.     Thoracic back: Normal.     Lumbar back: Tenderness present. No bony tenderness. Decreased range of motion. Positive left straight leg raise test. Negative right straight leg raise test.  Skin:    General: Skin is warm and dry.     Findings: No rash.  Neurological:     Mental Status: She is alert and oriented to person, place, and time.     GCS: GCS eye subscore is 4. GCS verbal subscore is 5. GCS motor subscore is 6.     Cranial Nerves: No cranial nerve deficit.     Sensory: No sensory deficit.     Motor: No abnormal muscle tone.     Coordination: Coordination normal.     Gait: Gait normal.     Deep Tendon Reflexes: Reflexes are normal and symmetric.     Comments: Nml cerebellar exam   No papilledema  Psychiatric:        Mood and Affect: Mood is not anxious or depressed.        Speech: Speech normal.        Behavior: Behavior normal. Behavior is cooperative.        Thought Content: Thought content normal.        Cognition and Memory: Memory is not impaired. She does not exhibit impaired recent memory or impaired remote memory.        Judgment: Judgment normal.       Results for orders placed or performed in visit on 08/06/22  HM MAMMOGRAPHY  Result Value Ref Range   HM Mammogram 0-4 Bi-Rad 0-4 Bi-Rad, Self Reported Normal     Assessment and Plan  Acute left-sided low back pain with left-sided sciatica Assessment & Plan: Acute, no improvement with over-the-counter anti-inflammatories and physical therapy   Continue PT.  Complete a course of prednisone taper.. call or MYChart with update early next week.  If no improvement with move forward with MRI  and possible referral to [facility] back specialist.   Other orders -     predniSONE; 3 tabs by mouth daily x 3 days, then 2 tabs by mouth daily x 2 days then 1 tab by mouth daily x 2 days  Dispense: 15 tablet; Refill: 0    No follow-ups on file.   Kerby Nora, MD

## 2022-08-19 NOTE — Assessment & Plan Note (Signed)
Acute, no improvement with over-the-counter anti-inflammatories and physical therapy   Continue PT.  Complete a course of prednisone taper.. call or MYChart with update early next week.  If no improvement with move forward with MRI and possible referral to [facility] back specialist.

## 2022-08-25 ENCOUNTER — Encounter: Payer: Self-pay | Admitting: Family Medicine

## 2022-09-06 ENCOUNTER — Encounter (HOSPITAL_COMMUNITY): Payer: Self-pay

## 2022-09-17 ENCOUNTER — Telehealth: Payer: Self-pay | Admitting: Family Medicine

## 2022-09-17 DIAGNOSIS — M5442 Lumbago with sciatica, left side: Secondary | ICD-10-CM

## 2022-09-17 NOTE — Telephone Encounter (Signed)
Patient called in and stated that she would a referral for an orthopedic. She stated that she has been going to PT for 2 months now and it isn't helping much. Thank you!

## 2022-09-17 NOTE — Telephone Encounter (Signed)
We have not yet imaged her spine.  I would suggest considering OV to come in for a spine x-ray/ re-eval and possible MRI spine.  If she would prefer to move forward with referral, where did which she like to go?  Any MD preference?  [facility] or [facility]?

## 2022-09-20 ENCOUNTER — Encounter: Payer: Self-pay | Admitting: *Deleted

## 2022-09-20 NOTE — Telephone Encounter (Signed)
Appointment scheduled with Dr. Ermalene Searing 09/21/2022 at 3:40 pm.  FYI to Dr. Ermalene Searing.

## 2022-09-20 NOTE — Telephone Encounter (Signed)
Left message with below information from Dr. Ermalene Searing.  I ask patient to call the office back to make an appointment with Dr. Ermalene Searing or if she wants to move forward with ortho referral does she have a MD preference and does she want to go to [facility] or [place]?

## 2022-09-20 NOTE — Telephone Encounter (Signed)
My Chart message also sent to patient.

## 2022-09-21 ENCOUNTER — Encounter: Payer: Self-pay | Admitting: Family Medicine

## 2022-09-21 ENCOUNTER — Ambulatory Visit: Payer: BC Managed Care – PPO | Admitting: Family Medicine

## 2022-09-21 ENCOUNTER — Ambulatory Visit (INDEPENDENT_AMBULATORY_CARE_PROVIDER_SITE_OTHER)
Admission: RE | Admit: 2022-09-21 | Discharge: 2022-09-21 | Disposition: A | Discharge status: Home / Self Care | Payer: BC Managed Care – PPO | Source: Ambulatory Visit | Attending: Family Medicine | Admitting: Family Medicine

## 2022-09-21 VITALS — BP 120/80 | HR 88 | Temp 97.7°F | Ht 59.75 in | Wt 168.0 lb

## 2022-09-21 DIAGNOSIS — M5442 Lumbago with sciatica, left side: Secondary | ICD-10-CM

## 2022-09-21 NOTE — Progress Notes (Signed)
Patient ID: [name], female    DOB: 05/23/1970, 52 y.o.   MRN: [id number]  This visit was conducted in person.  BP 120/80 (BP Location: Left Arm, Patient Position: Sitting, Cuff Size: Normal)   Pulse 88   Temp 97.7 F (36.5 C) (Temporal)   Ht 4' 11.75" (1.518 m)   Wt 168 lb (76.2 kg)   SpO2 94%   BMI 33.09 kg/m    CC:   Chief Complaint  Patient presents with   Back Pain    Subjective:   HPI: [name] is a 52 y.o. female presenting on 09/21/2022 for  continue back pain.   She continues to have issues with left hip/ buttocks. Pain radiates  Reviewed last OV from 06/10/22 and 08/12/2022  Diclofenac x 2 weeks unhelpful.  Naproxen once daily... not helpful.  Completed course of prednisone.. minimal benefit  Has been seeing chiropractor.  At last OV referred to PT... no better. She feels traction at PT made symptoms worse.   Pain now ongoing years, but worse in last 3 months.  Now daily pain.[name] interfering with activity.. Has made her unable to do Zumba. She can only walk or stand for 5 minutes  Using bike helped with pain   Has new numbness, no weakness... but occ pins and needle on left leg, occ front left foot burning.  No incontinence.   The patient has failed 6 weeks of conservative treatment including   diclofenac, naproxen, prednisone, chiropractor, physical therapy,.      Relevant past medical, surgical, family and social history reviewed and updated as indicated. Interim medical history since our last visit reviewed. Allergies and medications reviewed and updated. Outpatient Medications Prior to Visit  Medication Sig Dispense Refill   lithium carbonate (ESKALITH) 450 MG CR tablet Take 2 tablets (900 mg total) by mouth at bedtime. 60 tablet 1   predniSONE (DELTASONE) 20 MG tablet 3 tabs by mouth daily x 3 days, then 2 tabs by mouth daily x 2 days then 1 tab by mouth daily x 2 days 15 tablet 0   No  facility-administered medications prior to visit.     Per HPI unless specifically indicated in ROS section below Review of Systems  Constitutional:  Negative for fatigue and fever.  HENT:  Negative for congestion.   Eyes:  Negative for pain.  Respiratory:  Negative for cough and shortness of breath.   Cardiovascular:  Negative for chest pain, palpitations and leg swelling.  Gastrointestinal:  Negative for abdominal pain.  Genitourinary:  Negative for dysuria and vaginal bleeding.  Musculoskeletal:  Negative for back pain.  Neurological:  Negative for syncope, light-headedness and headaches.  Psychiatric/Behavioral:  Negative for dysphoric mood.    Objective:  BP 120/80 (BP Location: Left Arm, Patient Position: Sitting, Cuff Size: Normal)   Pulse 88   Temp 97.7 F (36.5 C) (Temporal)   Ht 4' 11.75" (1.518 m)   Wt 168 lb (76.2 kg)   SpO2 94%   BMI 33.09 kg/m   Wt Readings from Last 3 Encounters:  09/21/22 168 lb (76.2 kg)  08/12/22 164 lb 8 oz (74.6 kg)  06/10/22 165 lb 8 oz (75.1 kg)      Physical Exam Constitutional:      General: She is not in acute distress.    Appearance: Normal appearance. She is well-developed. She is not ill-appearing or toxic-appearing.  HENT:     Head: Normocephalic.     Right Ear: Hearing, tympanic membrane,  ear canal and external ear normal. Tympanic membrane is not erythematous, retracted or bulging.     Left Ear: Hearing, tympanic membrane, ear canal and external ear normal. Tympanic membrane is not erythematous, retracted or bulging.     Nose: No mucosal edema or rhinorrhea.     Right Sinus: No maxillary sinus tenderness or frontal sinus tenderness.     Left Sinus: No maxillary sinus tenderness or frontal sinus tenderness.     Mouth/Throat:     Pharynx: Uvula midline.  Eyes:     General: Lids are normal. Lids are everted, no foreign bodies appreciated.     Conjunctiva/sclera: Conjunctivae normal.     Pupils: Pupils are equal, round, and  reactive to light.  Neck:     Thyroid: No thyroid mass or thyromegaly.     Vascular: No carotid bruit.     Trachea: Trachea normal.  Cardiovascular:     Rate and Rhythm: Normal rate and regular rhythm.     Pulses: Normal pulses.     Heart sounds: Normal heart sounds, S1 normal and S2 normal. No murmur heard.    No friction rub. No gallop.  Pulmonary:     Effort: Pulmonary effort is normal. No tachypnea or respiratory distress.     Breath sounds: Normal breath sounds. No decreased breath sounds, wheezing, rhonchi or rales.  Abdominal:     General: Bowel sounds are normal.     Palpations: Abdomen is soft.     Tenderness: There is no abdominal tenderness.  Musculoskeletal:     Cervical back: Normal, normal range of motion and neck supple.     Thoracic back: Normal.     Lumbar back: Tenderness present. No bony tenderness. Decreased range of motion. Positive left straight leg raise test. Negative right straight leg raise test.  Skin:    General: Skin is warm and dry.     Findings: No rash.  Neurological:     Mental Status: She is alert and oriented to person, place, and time.     GCS: GCS eye subscore is 4. GCS verbal subscore is 5. GCS motor subscore is 6.     Cranial Nerves: No cranial nerve deficit.     Sensory: No sensory deficit.     Motor: No abnormal muscle tone.     Coordination: Coordination normal.     Gait: Gait normal.     Deep Tendon Reflexes: Reflexes are normal and symmetric.     Comments: Nml cerebellar exam   No papilledema  Psychiatric:        Mood and Affect: Mood is not anxious or depressed.        Speech: Speech normal.        Behavior: Behavior normal. Behavior is cooperative.        Thought Content: Thought content normal.        Cognition and Memory: Memory is not impaired. She does not exhibit impaired recent memory or impaired remote memory.        Judgment: Judgment normal.       Results for orders placed or performed in visit on 08/06/22  HM  MAMMOGRAPHY  Result Value Ref Range   HM Mammogram 0-4 Bi-Rad 0-4 Bi-Rad, Self Reported Normal    Assessment and Plan  Acute left-sided low back pain with left-sided sciatica Assessment & Plan:  Acute, no improvement in >  6 months.  The patient has failed  >6 weeks of conservative treatment including   diclofenac, naproxen, prednisone, chiropractor,  physical therapy, heat   Pain effective you quality of life, sleep, walking etc.  DD, and OA change seen on plain film Lumbar spine.  Will eval further with MRiIlumbar spine   No past back surgery.   No red flags for urgent eval.  Orders: -     DG Lumbar Spine Complete; Future -     MR LUMBAR SPINE WO CONTRAST; Future    No follow-ups on file.   Kerby Nora, MD

## 2022-09-21 NOTE — Assessment & Plan Note (Signed)
Acute, no improvement in >  6 months.  The patient has failed  >6 weeks of conservative treatment including   diclofenac, naproxen, prednisone, chiropractor, physical therapy, heat   Pain effective you quality of life, sleep, walking etc.  DD, and OA change seen on plain film Lumbar spine.  Will eval further with MRiIlumbar spine   No past back surgery.   No red flags for urgent eval.

## 2022-09-22 ENCOUNTER — Encounter: Payer: Self-pay | Admitting: *Deleted

## 2022-10-09 IMAGING — CT CT HEAD W/O CM
4 series · 17 of 47 positions shown, 19 images · non-contrast
Comparison: 03/15/2017

CLINICAL DATA: Trauma



[Series 2: head bone · axial · 0.40mm/px · z∈[-582,-530]mm · 4 of 76 slices shown]
[im 8/76  bone]
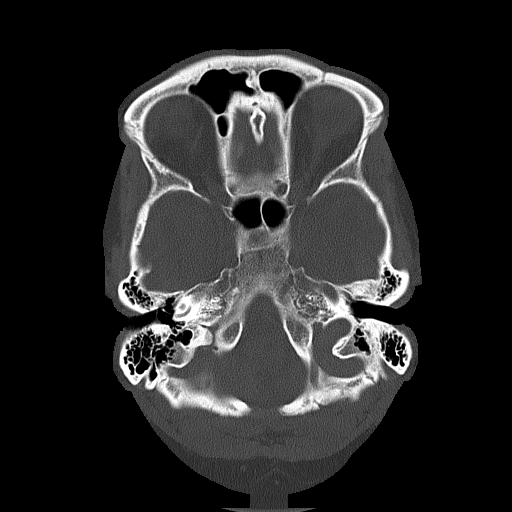
[im 16/76  bone]
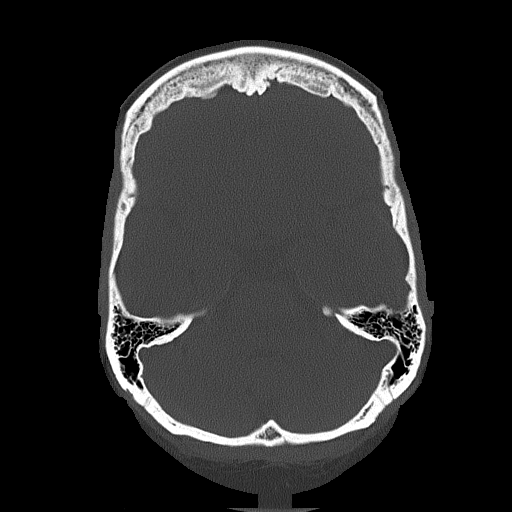
[im 23/76  bone]
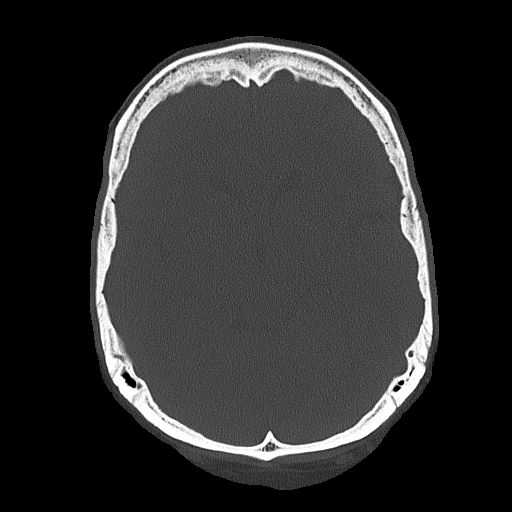
[im 34/76  bone]
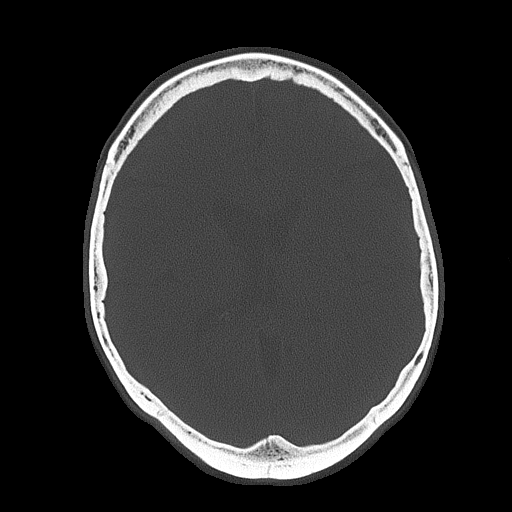

[Series 3: head wo · axial · 0.40mm/px · z∈[-581,-466]mm · 7 of 31 slices shown, 9 images]
[im 4/31  brain]
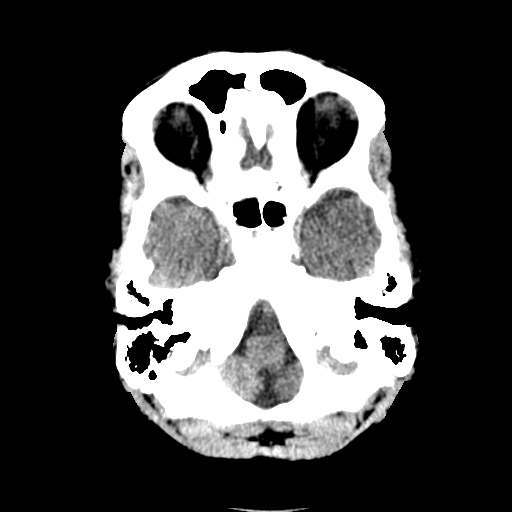
[im 4/31  bone]
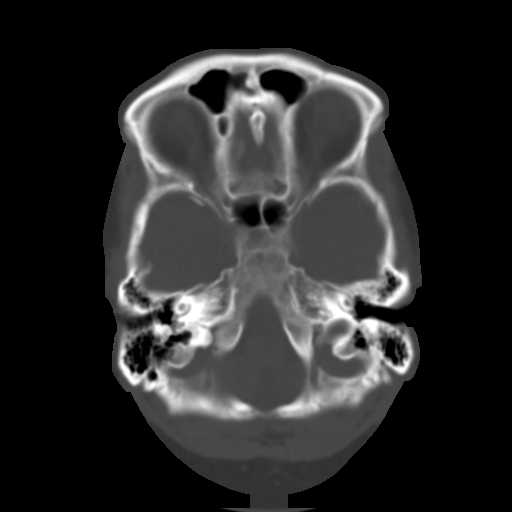
[im 8/31  brain]
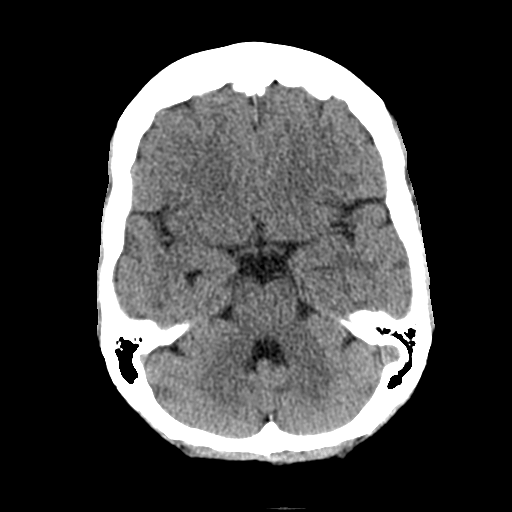
[im 12/31  brain]
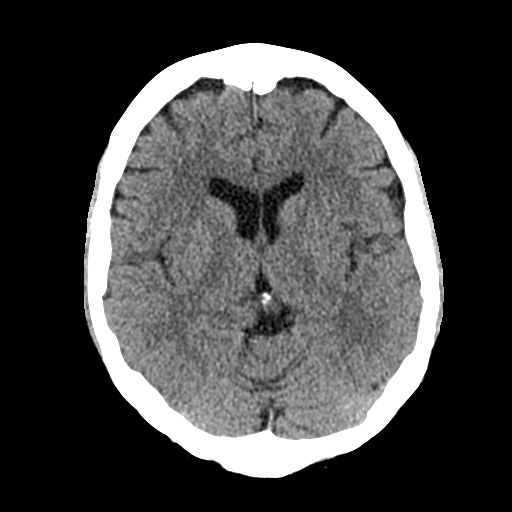
[im 16/31  brain]
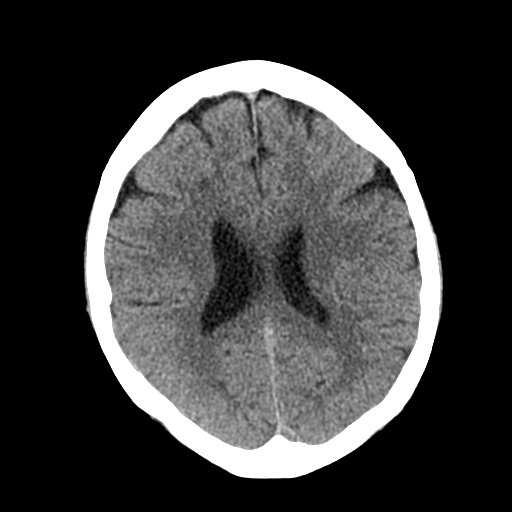
[im 19/31  brain]
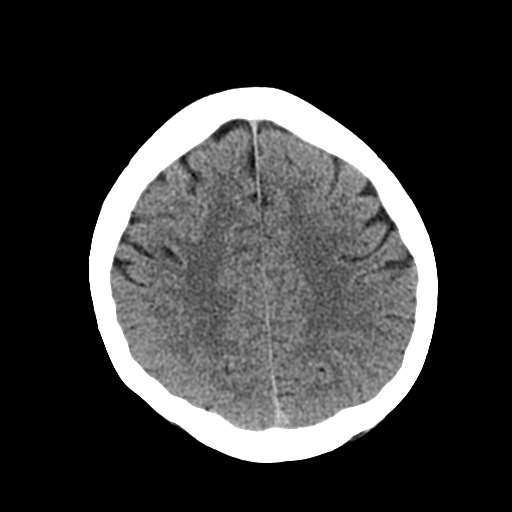
[im 19/31  bone]
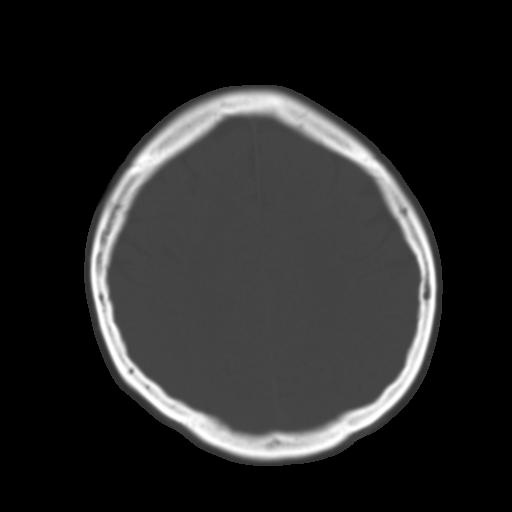
[im 23/31  brain]
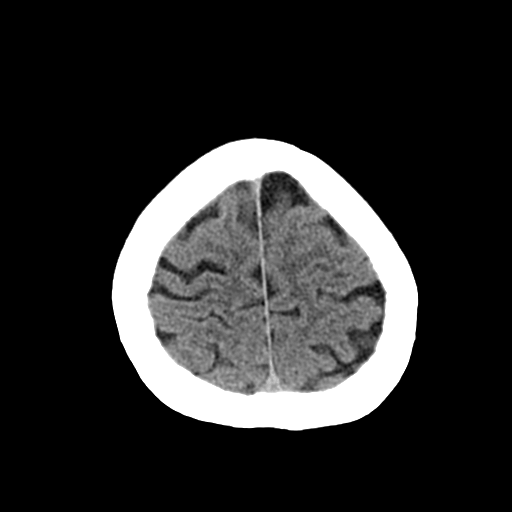
[im 27/31  brain]
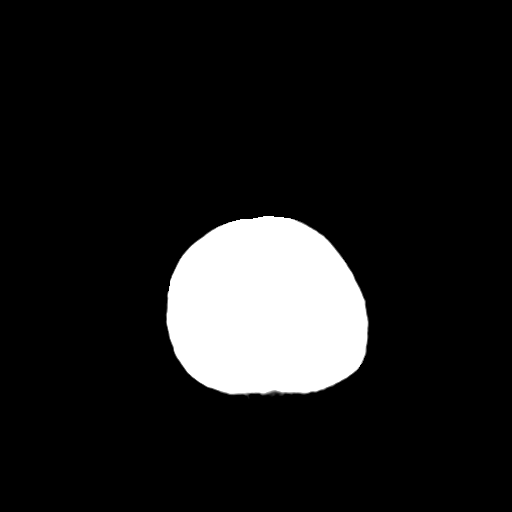

[Series 4: coronal soft tissue · coronal · 0.34mm/px · 3 of 63 slices shown]
[im 21/63  brain]
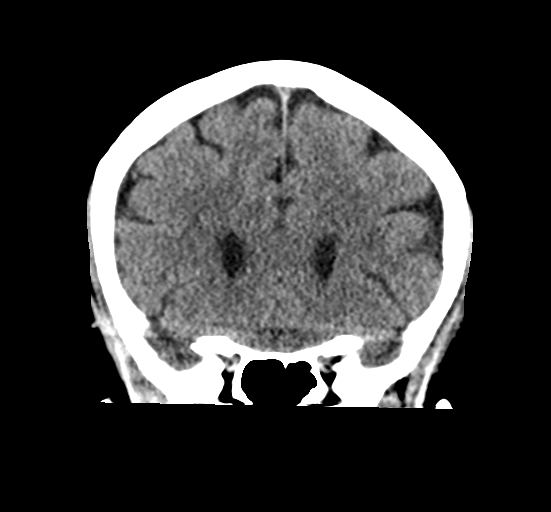
[im 28/63  brain]
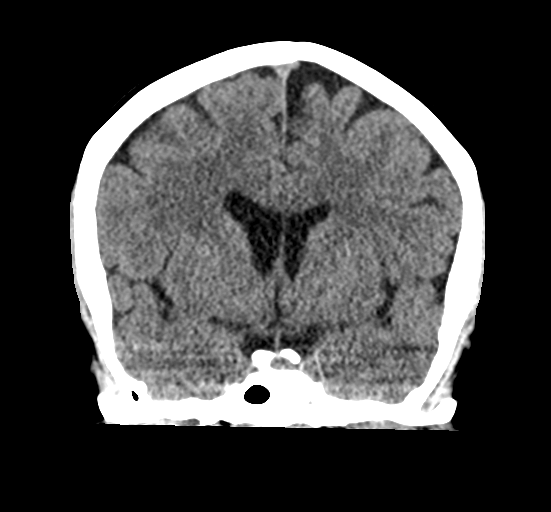
[im 35/63  brain]
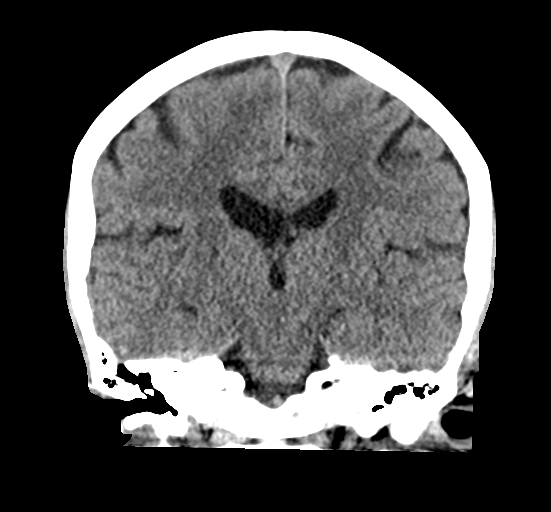

[Series 5: sagittal soft tissue · sagittal · 0.34mm/px · 3 of 53 slices shown]
[im 18/53  brain]
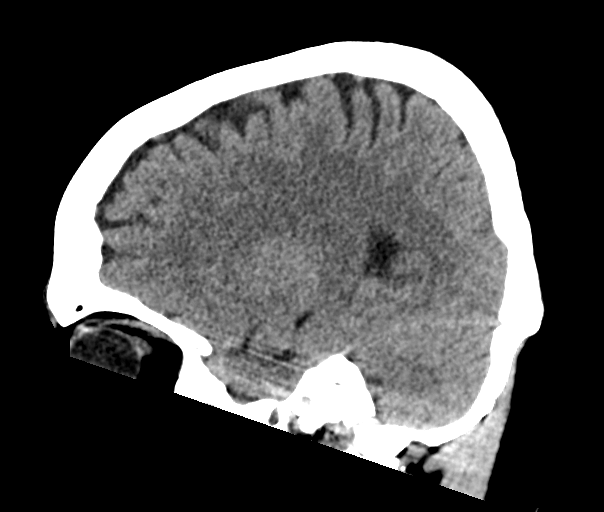
[im 27/53  brain]
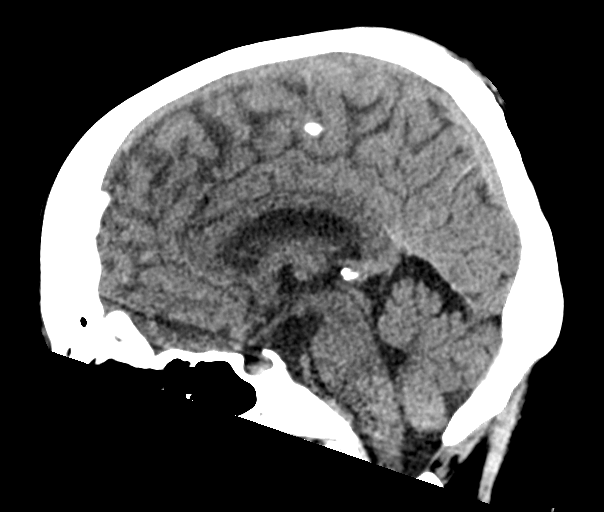
[im 35/53  brain]
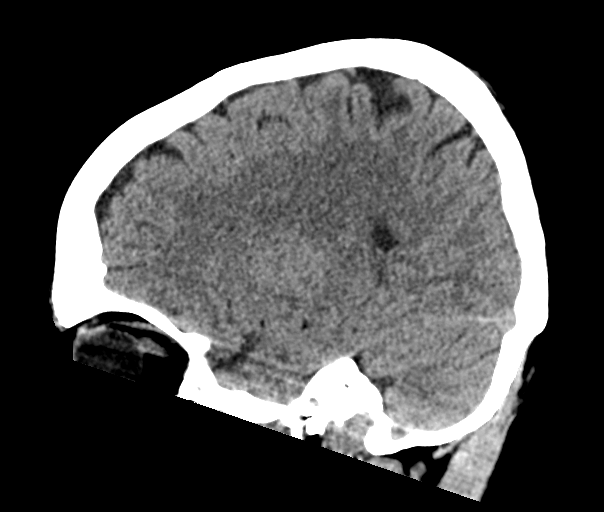

[17 of 47 positions shown; findings below may reference images not displayed]

FINDINGS: Brain: No acute intracranial findings are seen. Cortical sulci are
prominent. Ventricles are not dilated. There is no shift of midline
structures.

Vascular: Unremarkable.

Skull: Hyperostosis frontalis interna is seen.

Sinuses/Orbits: Unremarkable.

Other: None
IMPRESSION: No acute intracranial findings are seen in noncontrast CT brain.

## 2022-10-09 IMAGING — CT CT CERVICAL SPINE W/O CM
3 of 4 series · 10 of 33 positions shown, 12 images · non-contrast
Comparison: None.

CLINICAL DATA: Trauma



[Series 6: sagittal bone · sagittal · 0.22mm/px · 5 of 55 slices shown, 6 images]
[im 19/55  bone]
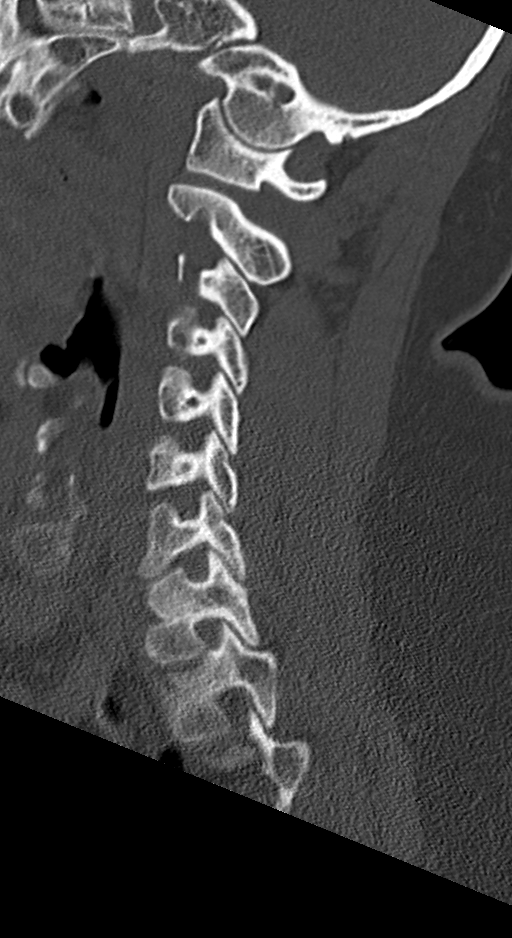
[im 23/55  bone]
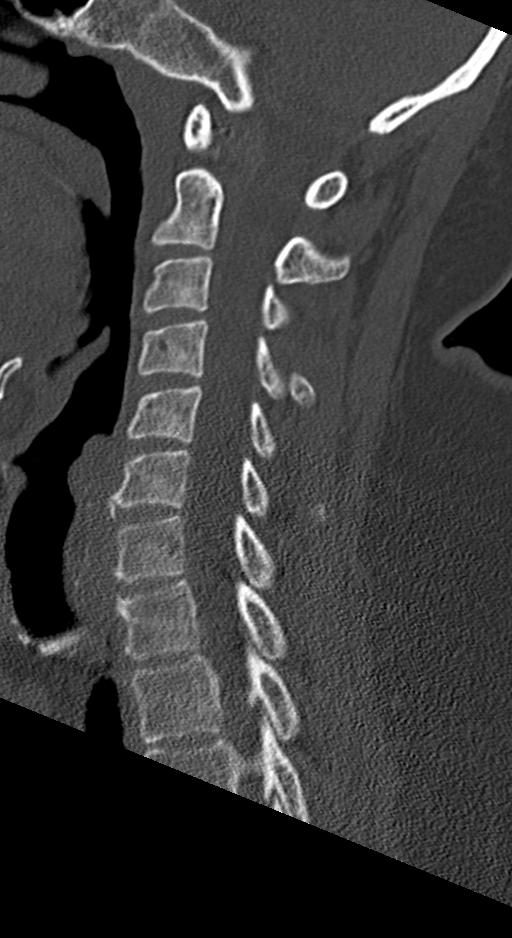
[im 28/55  soft-tissue]
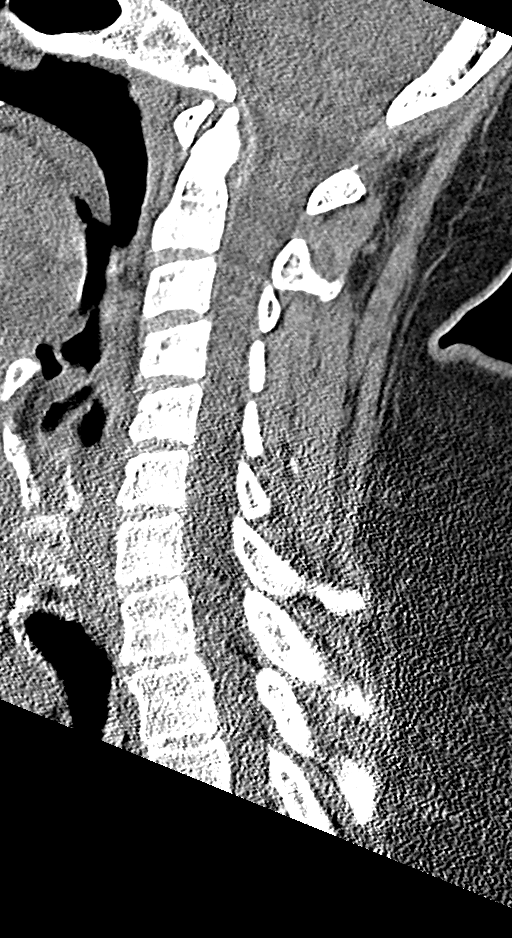
[im 28/55  bone]
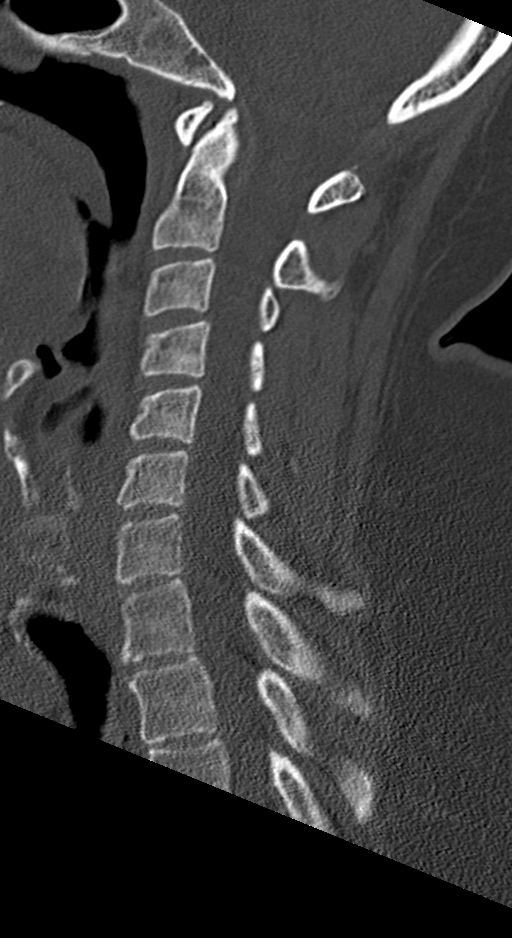
[im 32/55  bone]
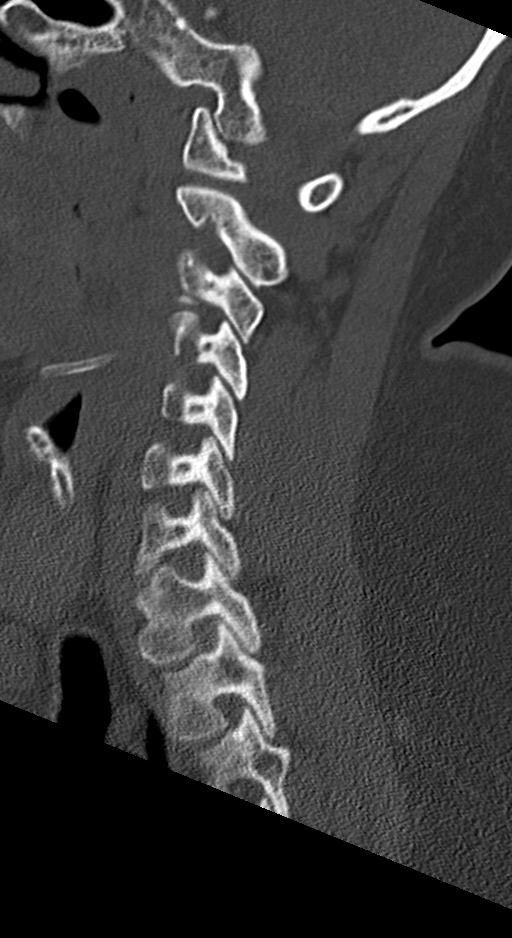
[im 37/55  bone]
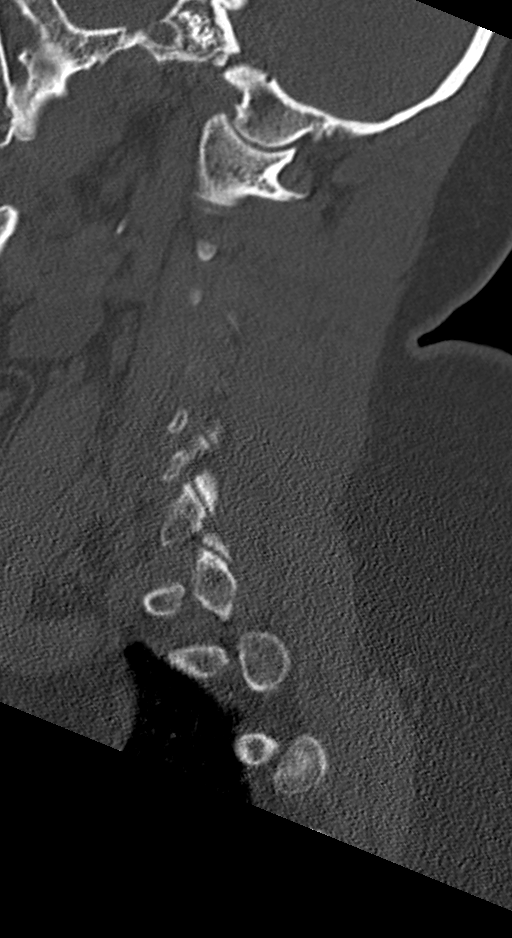

[Series 7: coronal bone · coronal · 0.21mm/px · 3 of 57 slices shown]
[im 12/57  bone]
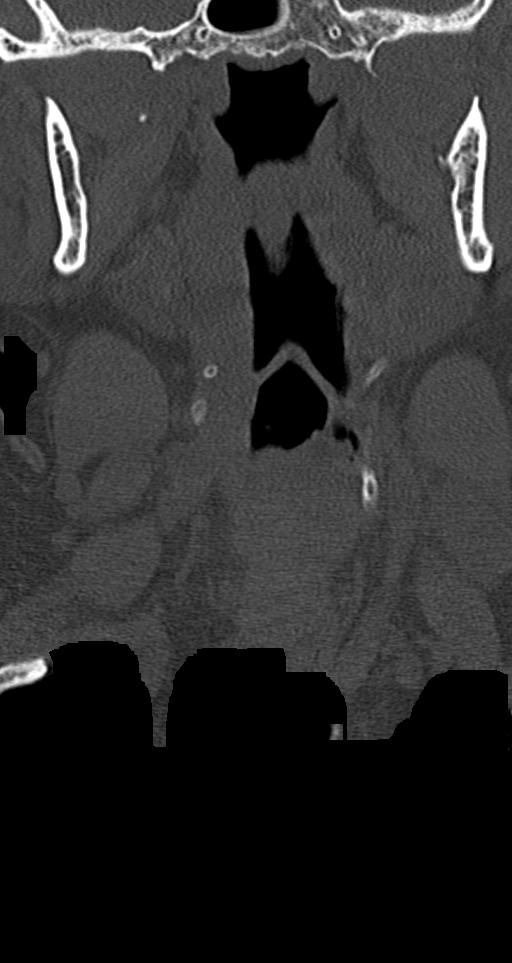
[im 23/57  bone]
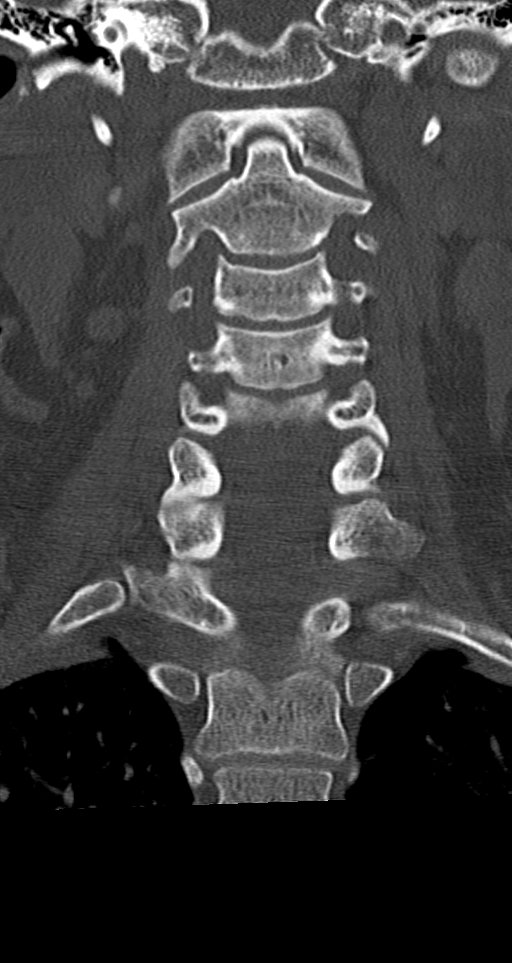
[im 34/57  bone]
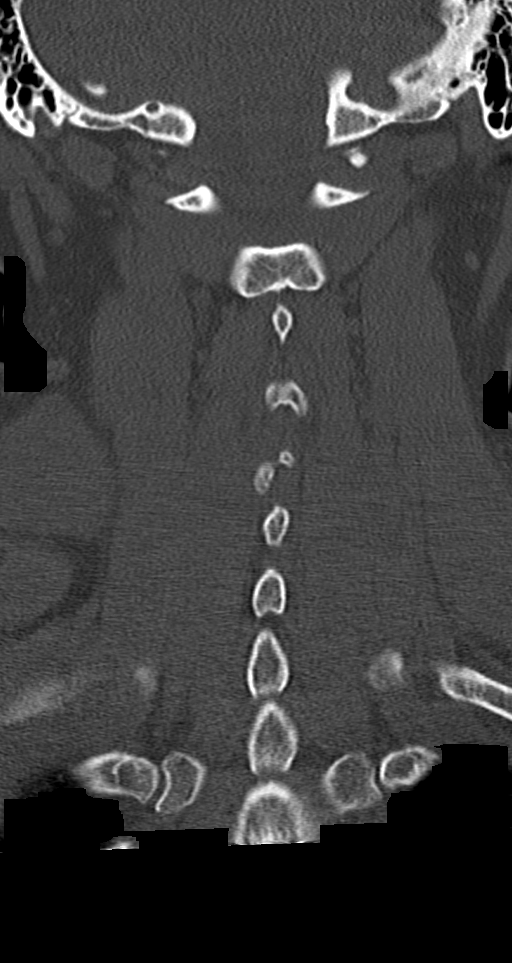

[Series 8: orthogonal bone · axial · 0.20mm/px · z∈[-728,-667]mm · 2 of 100 slices shown, 3 images]
[im 34/100  soft-tissue]
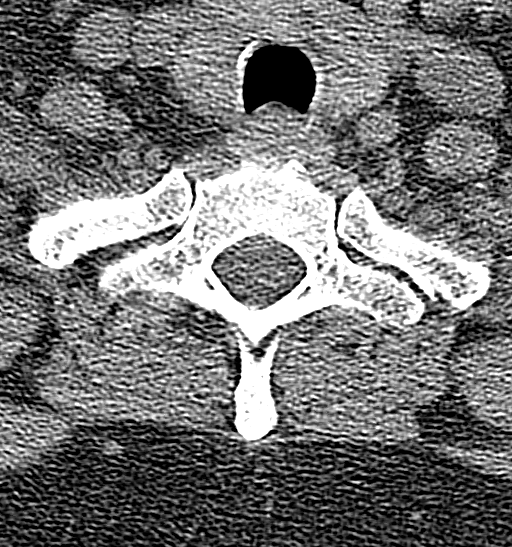
[im 34/100  bone]
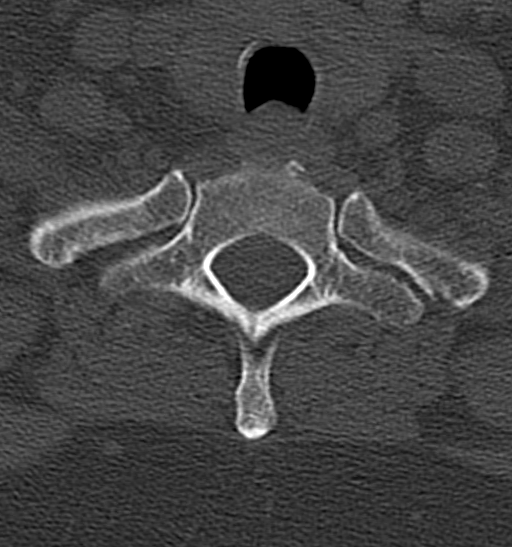
[im 67/100  bone]
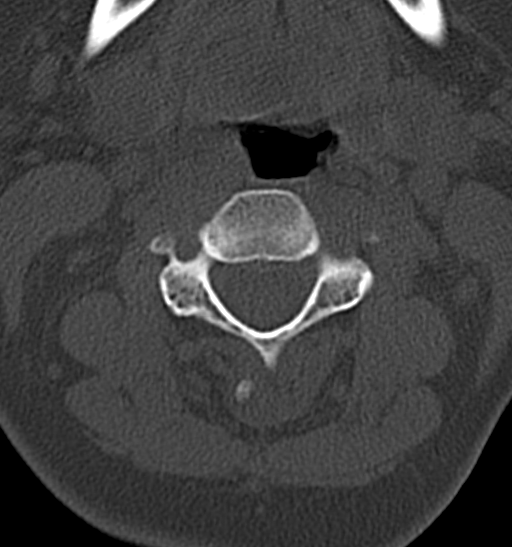

[10 of 33 positions shown; findings below may reference images not displayed]

FINDINGS: Alignment: Alignment of posterior margins of vertebral bodies is
unremarkable.

Skull base and vertebrae: No recent fracture is seen. Small anterior
bony spurs seen from C6-T2 levels.

Soft tissues and spinal canal: There is no significant spinal
stenosis.

Disc levels: There is no significant encroachment of neural
foramina.

Upper chest: Bulla is seen in the medial left upper lung fields.

Other: None.
IMPRESSION: No recent fracture is seen in the cervical spine. Degenerative
changes with small anterior bony spurs are noted in the lower
cervical and upper thoracic spine.

Possible bulla is seen in the medial left upper lung fields.

## 2022-10-11 IMAGING — DX DG FOOT 2V*L*
2 series · 2 of 2 positions shown · non-contrast
Comparison: None.

CLINICAL DATA: Left ankle and foot swelling

EXAM:
LEFT FOOT - 2 VIEW

[foot ap]
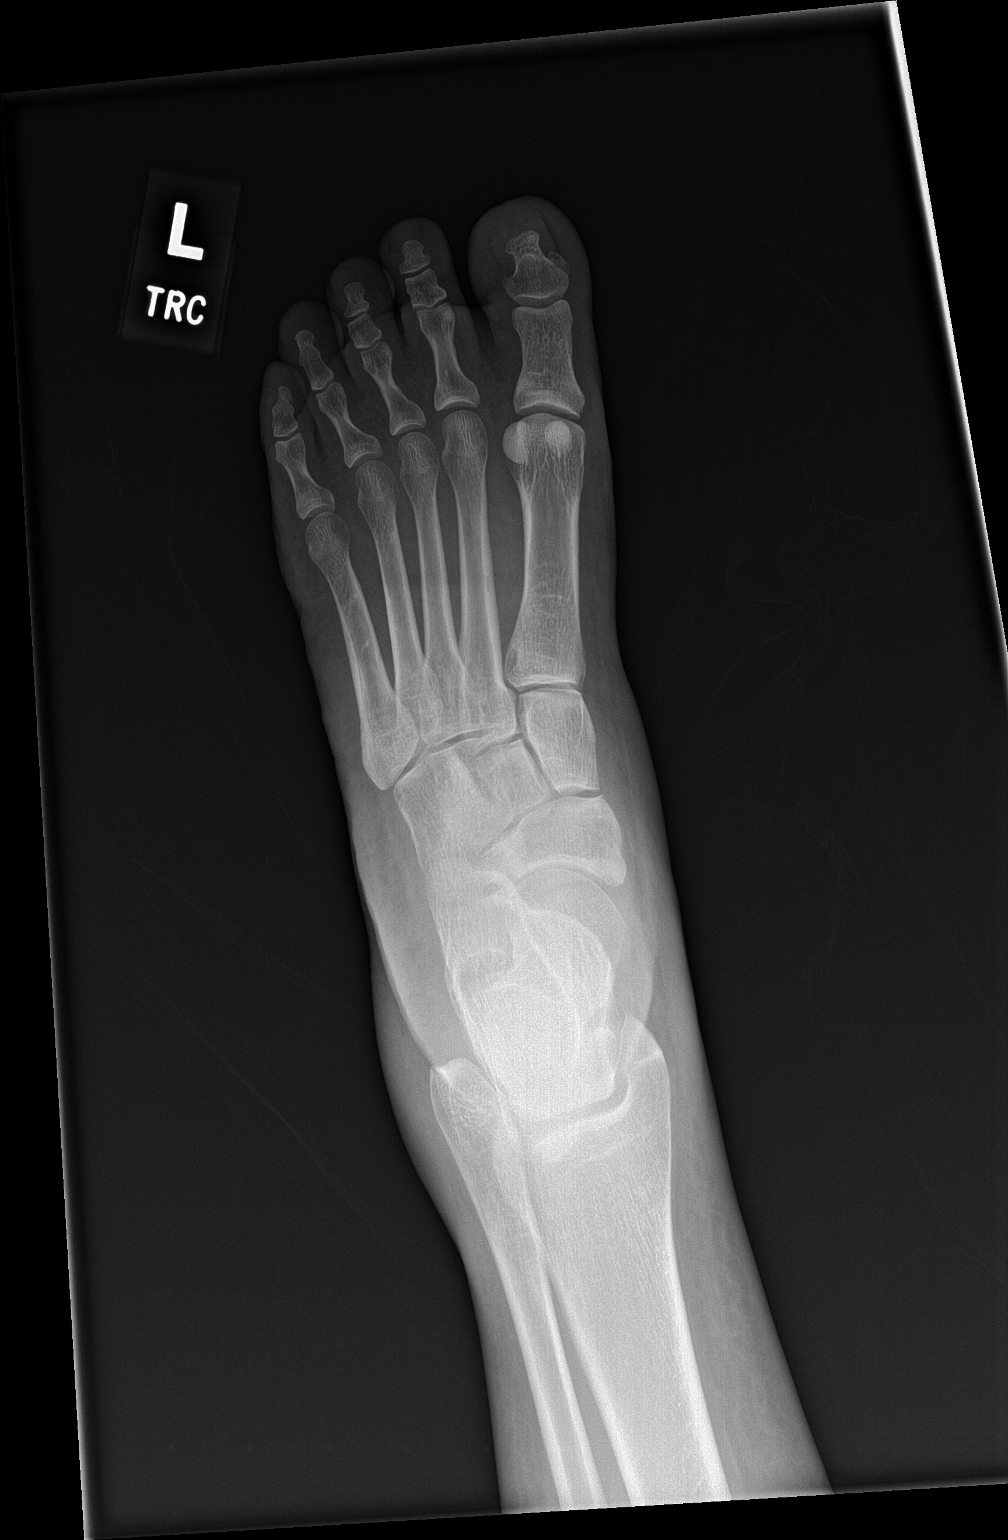

[foot lat]
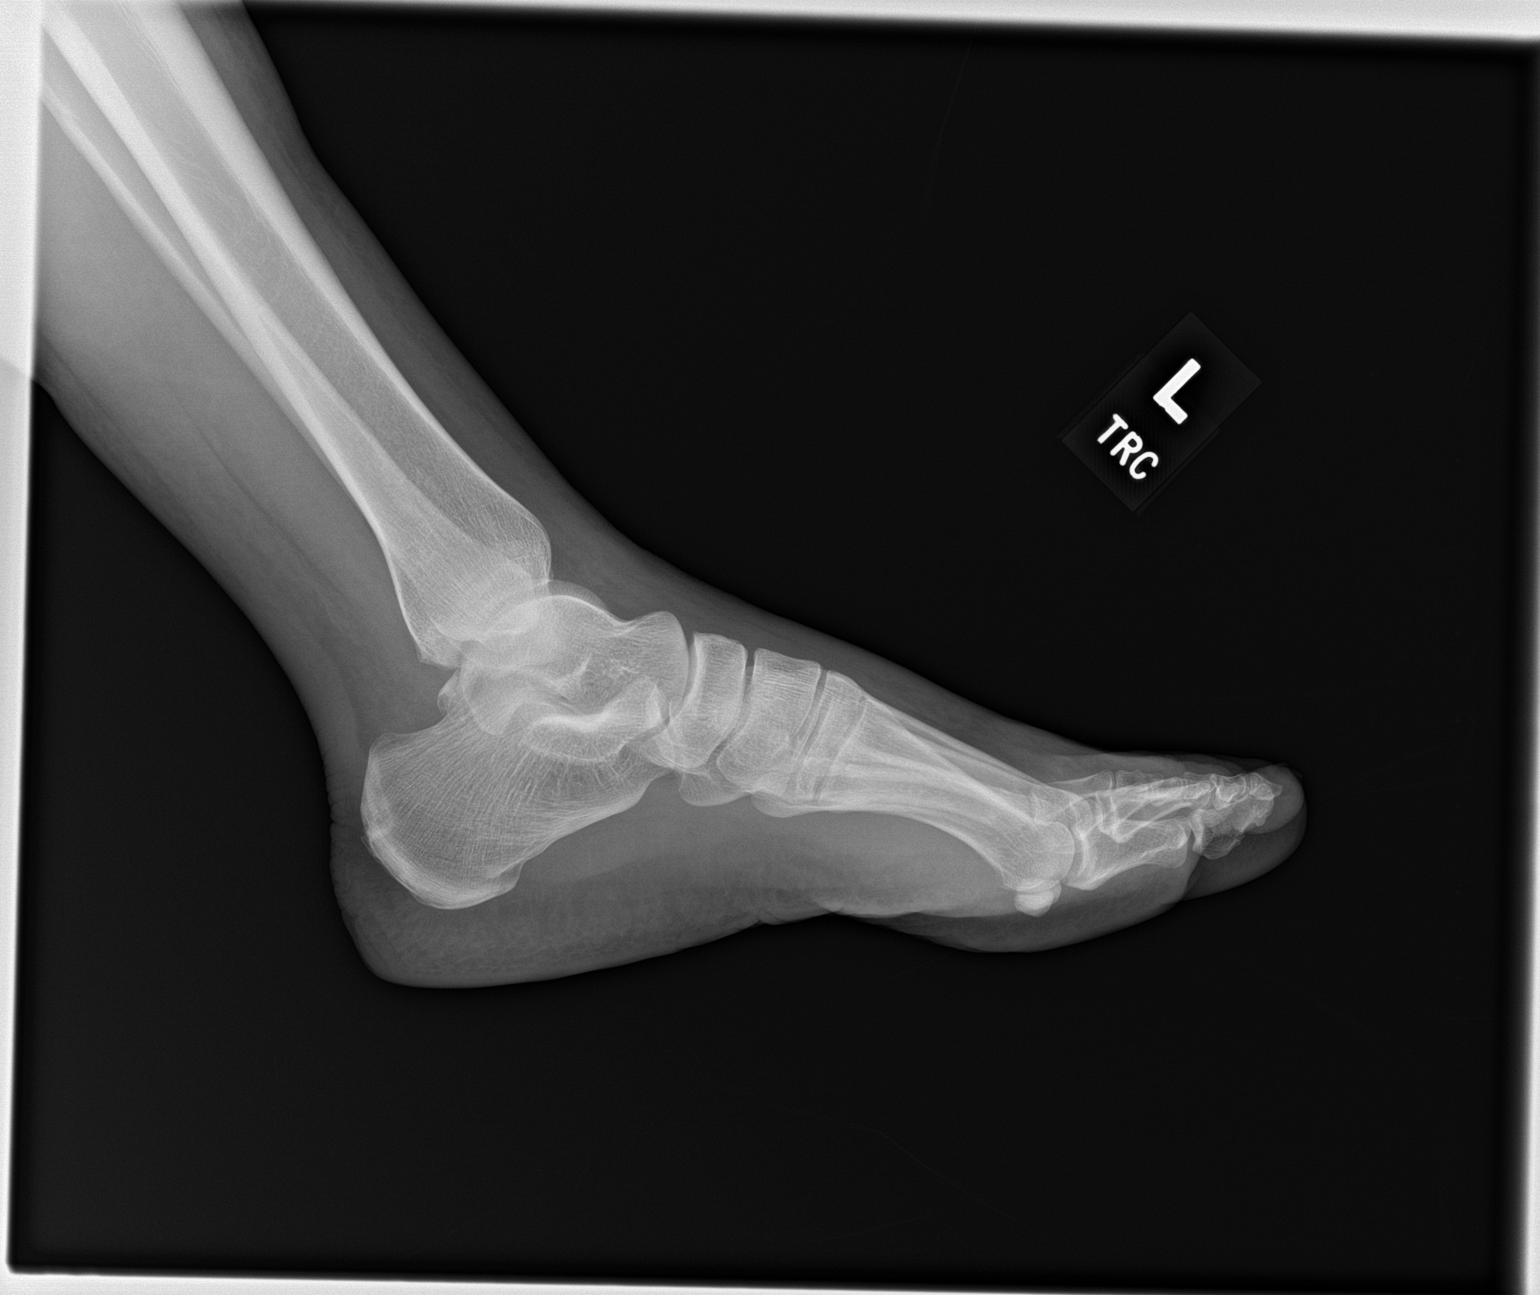

[2 of 2 positions shown; findings below may reference images not displayed]

FINDINGS: There is no evidence of fracture or dislocation. There is no
evidence of arthropathy or other focal bone abnormality. Soft
tissues are unremarkable.
IMPRESSION: Negative.

## 2022-10-28 NOTE — Telephone Encounter (Signed)
Okay to postpone/cancel MRI.

## 2022-10-28 NOTE — Addendum Note (Signed)
Addended by: Damita Lack on: 10/28/2022 11:48 AM   Modules accepted: Orders

## 2022-10-28 NOTE — Telephone Encounter (Signed)
MRI order cancelled.  

## 2023-03-25 ENCOUNTER — Encounter: Payer: Self-pay | Admitting: Primary Care

## 2023-03-25 ENCOUNTER — Ambulatory Visit: Payer: BC Managed Care – PPO | Admitting: Primary Care

## 2023-03-25 VITALS — BP 130/86 | HR 74 | Temp 98.2°F | Ht 59.75 in | Wt 155.0 lb

## 2023-03-25 DIAGNOSIS — R051 Acute cough: Secondary | ICD-10-CM | POA: Insufficient documentation

## 2023-03-25 MED ORDER — HYDROCOD POLI-CHLORPHE POLI ER 10-8 MG/5ML PO SUER: 5.0000 mL | Freq: Two times a day (BID) | ORAL | 0 refills | Status: DC | PRN

## 2023-03-25 NOTE — Patient Instructions (Signed)
You may take the cough suppressant every 12 hours as needed for cough and rest. Caution this medication contains codeine which may cause drowsiness.   You can try a few things over the counter to help with your symptoms including:  Nasal Congestion/Ear Pressure/Sinus Pressure: Try using Flonase (fluticasone) nasal spray. Instill 1 spray in each nostril twice daily. This can be purchased over the counter.  Runny Nose/Throat Drainage/Sneezing/Itchy or Watery Eyes: An antihistamine such as Zyrtec, Claritin, Xyzal, Allegra  Please call us Monday next week if you do not continue to improve or if your symptoms worsen.  It was a pleasure meeting you!

## 2023-03-25 NOTE — Assessment & Plan Note (Signed)
Symptoms and presentation today representative of viral etiology. Fortunately, she is feeling better.  Respiratory exam overall benign.  Agreed to help her with cough suppression and sleep. Start Tussionex twice daily as needed.  Drowsiness precautions provided.  Discussed that if her symptoms do not continue to improve, or she worsens over the weekend to give [place] a call next week.
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
## 2023-11-11 ENCOUNTER — Ambulatory Visit: Payer: Self-pay | Admitting: Family Medicine

## 2023-11-11 ENCOUNTER — Encounter: Payer: Self-pay | Admitting: Family Medicine

## 2023-11-11 LAB — HM MAMMOGRAPHY

## 2024-05-10 ENCOUNTER — Encounter: Payer: Self-pay | Admitting: Family Medicine

## 2024-05-15 ENCOUNTER — Encounter: Payer: Self-pay | Admitting: Family Medicine

## 2024-05-15 ENCOUNTER — Ambulatory Visit: Admitting: Family Medicine

## 2024-05-15 ENCOUNTER — Other Ambulatory Visit (HOSPITAL_COMMUNITY)
Admission: RE | Admit: 2024-05-15 | Discharge: 2024-05-15 | Disposition: A | Source: Ambulatory Visit | Attending: Family Medicine | Admitting: Family Medicine

## 2024-05-15 VITALS — BP 120/78 | HR 78 | Temp 98.4°F | Ht 60.0 in | Wt 162.5 lb

## 2024-05-15 DIAGNOSIS — Z113 Encounter for screening for infections with a predominantly sexual mode of transmission: Secondary | ICD-10-CM

## 2024-05-15 DIAGNOSIS — Z124 Encounter for screening for malignant neoplasm of cervix: Secondary | ICD-10-CM

## 2024-05-15 DIAGNOSIS — Z0000 Encounter for general adult medical examination without abnormal findings: Secondary | ICD-10-CM

## 2024-05-15 DIAGNOSIS — Z13220 Encounter for screening for lipoid disorders: Secondary | ICD-10-CM | POA: Diagnosis not present

## 2024-05-15 DIAGNOSIS — Z131 Encounter for screening for diabetes mellitus: Secondary | ICD-10-CM | POA: Diagnosis not present

## 2024-05-15 DIAGNOSIS — Z23 Encounter for immunization: Secondary | ICD-10-CM | POA: Diagnosis not present

## 2024-05-15 NOTE — Progress Notes (Unsigned)
 "   Patient ID: [name], female    DOB: 1971-01-20, 54 y.o.   MRN: [id number]  This visit was conducted in person.  BP 120/78   Pulse 78   Temp 98.4 F (36.9 C) (Temporal)   Ht 5' (1.524 m)   Wt 162 lb 8 oz (73.7 kg)   SpO2 98%   BMI 31.74 kg/m    CC:  Chief Complaint  Patient presents with   Annual Exam   STI Testing    Subjective:   HPI: [name] is a 54 y.o. female presenting on 05/15/2024 for Annual Exam and STI Testing  The patient presents for annual medicare wellness, complete physical and review of chronic health problems. He/She also has the following acute concerns today:  Due for lab re-eval.  Diet:  moderate, veggies, meat, watching carbs Exercise:  dancing nightly  Wt Readings from Last 3 Encounters:  05/15/24 162 lb 8 oz (73.7 kg)  03/25/23 155 lb (70.3 kg)  09/21/22 168 lb (76.2 kg)         Relevant past medical, surgical, family and social history reviewed and updated as indicated. Interim medical history since our last visit reviewed. Allergies and medications reviewed and updated. Outpatient Medications Prior to Visit  Medication Sig Dispense Refill   lithium  carbonate (ESKALITH ) 450 MG CR tablet Take 2 tablets (900 mg total) by mouth at bedtime. 60 tablet 1   chlorpheniramine-HYDROcodone (TUSSIONEX) 10-8 MG/5ML Take 5 mLs by mouth every 12 (twelve) hours as needed. 50 mL 0   No facility-administered medications prior to visit.     Per HPI unless specifically indicated in ROS section below Review of Systems  Constitutional:  Negative for fatigue and fever.  HENT:  Negative for congestion.   Eyes:  Negative for pain.  Respiratory:  Negative for cough and shortness of breath.   Cardiovascular:  Negative for chest pain, palpitations and leg swelling.  Gastrointestinal:  Negative for abdominal pain.  Genitourinary:  Negative for dysuria and vaginal bleeding.  Musculoskeletal:  Negative for back pain.   Neurological:  Negative for syncope, light-headedness and headaches.  Psychiatric/Behavioral:  Negative for dysphoric mood.    Objective:  BP 120/78   Pulse 78   Temp 98.4 F (36.9 C) (Temporal)   Ht 5' (1.524 m)   Wt 162 lb 8 oz (73.7 kg)   SpO2 98%   BMI 31.74 kg/m   Wt Readings from Last 3 Encounters:  05/15/24 162 lb 8 oz (73.7 kg)  03/25/23 155 lb (70.3 kg)  09/21/22 168 lb (76.2 kg)      Physical Exam Constitutional:      General: She is not in acute distress.    Appearance: Normal appearance. She is well-developed. She is not ill-appearing or toxic-appearing.  HENT:     Head: Normocephalic.     Right Ear: Hearing, tympanic membrane, ear canal and external ear normal.     Left Ear: Hearing, tympanic membrane, ear canal and external ear normal.     Nose: Nose normal.  Eyes:     General: Lids are normal. Lids are everted, no foreign bodies appreciated.     Conjunctiva/sclera: Conjunctivae normal.     Pupils: Pupils are equal, round, and reactive to light.  Neck:     Thyroid : No thyroid  mass or thyromegaly.     Vascular: No carotid bruit.     Trachea: Trachea normal.  Cardiovascular:     Rate and Rhythm: Normal rate and regular rhythm.  Heart sounds: Normal heart sounds, S1 normal and S2 normal. No murmur heard.    No gallop.  Pulmonary:     Effort: Pulmonary effort is normal. No respiratory distress.     Breath sounds: Normal breath sounds. No wheezing, rhonchi or rales.  Abdominal:     General: Bowel sounds are normal. There is no distension or abdominal bruit.     Palpations: Abdomen is soft. There is no fluid wave or mass.     Tenderness: There is no abdominal tenderness. There is no guarding or rebound.     Hernia: No hernia is present.  Musculoskeletal:     Cervical back: Normal range of motion and neck supple.  Lymphadenopathy:     Cervical: No cervical adenopathy.  Skin:    General: Skin is warm and dry.     Findings: No rash.  Neurological:      Mental Status: She is alert.     Cranial Nerves: No cranial nerve deficit.     Sensory: No sensory deficit.  Psychiatric:        Mood and Affect: Mood is not anxious or depressed.        Speech: Speech normal.        Behavior: Behavior normal. Behavior is cooperative.        Judgment: Judgment normal.       Results for orders placed or performed in visit on 11/11/23  [facility] MAMMOGRAPHY   Collection Time: 11/11/23 11:55 AM  Result Value Ref Range   HM Mammogram 0-4 Bi-Rad 0-4 Bi-Rad, Self Reported Normal     COVID 19 screen:  No recent travel or known exposure to COVID19 The patient denies respiratory symptoms of COVID 19 at this time. The importance of social distancing was discussed today.   Assessment and Plan The patient's preventative maintenance and recommended screening tests for an annual wellness exam were reviewed in full today. Brought up to date unless services declined.  Counselled on the importance of diet, exercise, and its role in overall health and mortality. The patient's FH and SH was reviewed, including their home life, tobacco status, and drug and alcohol status.    Vaccines: Due prevnar, shingrix, uptodate Tdap and given flu today. Pap/DVE:  2020, repeat in 5 years, DUE Mammo:   11/2023 Colon: no colon cancer in family... colonoscopy 08/16/2022,  repeat in 3 years Smoking Status: former smoker, remotely minimal ETOH/ drug use: rare ETOH socially/none  HIV screen:    she would like to be screened.  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

## 2024-05-15 NOTE — Patient Instructions (Signed)
"   Look into coverage of Prevnar and Zostavax. "

## 2024-05-16 LAB — COMPREHENSIVE METABOLIC PANEL WITH GFR
ALT: 15 U/L (ref 3–35)
AST: 17 U/L (ref 5–37)
Albumin: 4.5 g/dL (ref 3.5–5.2)
Alkaline Phosphatase: 65 U/L (ref 39–117)
BUN: 22 mg/dL (ref 6–23)
CO2: 27 meq/L (ref 19–32)
Calcium: 9.3 mg/dL (ref 8.4–10.5)
Chloride: 103 meq/L (ref 96–112)
Creatinine, Ser: 0.91 mg/dL (ref 0.40–1.20)
GFR: 72.73 mL/min
Glucose, Bld: 91 mg/dL (ref 70–99)
Potassium: 4.1 meq/L (ref 3.5–5.1)
Sodium: 137 meq/L (ref 135–145)
Total Bilirubin: 0.3 mg/dL (ref 0.2–1.2)
Total Protein: 7.5 g/dL (ref 6.0–8.3)

## 2024-05-16 LAB — LIPID PANEL
Cholesterol: 217 mg/dL — ABNORMAL HIGH (ref 28–200)
HDL: 58.6 mg/dL
LDL Cholesterol: 140 mg/dL — ABNORMAL HIGH (ref 10–99)
NonHDL: 158.42
Total CHOL/HDL Ratio: 4
Triglycerides: 92 mg/dL (ref 10.0–149.0)
VLDL: 18.4 mg/dL (ref 0.0–40.0)

## 2024-05-16 LAB — HEPATITIS PANEL, ACUTE
Hep A IgM: NONREACTIVE
Hep B C IgM: NONREACTIVE
Hepatitis B Surface Ag: NONREACTIVE
Hepatitis C Ab: NONREACTIVE

## 2024-05-16 LAB — SYPHILIS: RPR W/REFLEX TO RPR TITER AND TREPONEMAL ANTIBODIES, TRADITIONAL SCREENING AND DIAGNOSIS ALGORITHM: RPR Ser Ql: NONREACTIVE

## 2024-05-16 LAB — HERPES SIMPLEX VIRUS 1 AND 2 (IGG),REFLEX HSV-2 INHIBITION
HSV 1 IGG,TYPE SPECIFIC AB: <0.9 {index}
HSV 2 IGG,TYPE SPECIFIC AB: 7.9 {index} — ABNORMAL HIGH

## 2024-05-16 LAB — HEMOGLOBIN A1C: Hgb A1c MFr Bld: 6.1 % (ref 4.6–6.5)

## 2024-05-16 LAB — HIV ANTIBODY (ROUTINE TESTING W REFLEX)
HIV 1&2 Ab, 4th Generation: NONREACTIVE
HIV FINAL INTERPRETATION: NEGATIVE

## 2024-05-17 ENCOUNTER — Ambulatory Visit: Payer: Self-pay | Admitting: Family Medicine

## 2024-05-18 LAB — CYTOLOGY - PAP
Chlamydia: NEGATIVE
Comment: NEGATIVE
Comment: NEGATIVE
Comment: NEGATIVE
Comment: NEGATIVE
Comment: NORMAL
HPV 16: NEGATIVE
HPV 18 / 45: NEGATIVE
High risk HPV: POSITIVE — AB
Neisseria Gonorrhea: NEGATIVE

## 2024-05-22 ENCOUNTER — Encounter: Payer: Self-pay | Admitting: Family Medicine

## 2024-07-19 ENCOUNTER — Encounter: Admitting: Family Medicine
